# Patient Record
Sex: Male | Born: 1960 | Race: Black or African American | Hispanic: No | Smoking: Never smoker
Health system: Southern US, Community
[De-identification: ages and names within clinical notes are randomized; demographics above are authoritative.]

## PROBLEM LIST (undated history)

## (undated) ENCOUNTER — Telehealth

## (undated) ENCOUNTER — Encounter: Attending: Primary Care | Primary: Primary Care

## (undated) ENCOUNTER — Ambulatory Visit

## (undated) ENCOUNTER — Ambulatory Visit: Payer: MEDICARE

## (undated) ENCOUNTER — Encounter

## (undated) ENCOUNTER — Encounter: Attending: Family | Primary: Family

## (undated) ENCOUNTER — Ambulatory Visit: Payer: MEDICARE | Attending: Primary Care | Primary: Primary Care

## (undated) DIAGNOSIS — G4730 Sleep apnea, unspecified: Secondary | ICD-10-CM

## (undated) DIAGNOSIS — E669 Obesity, unspecified: Secondary | ICD-10-CM

## (undated) DIAGNOSIS — Z8739 Personal history of other diseases of the musculoskeletal system and connective tissue: Secondary | ICD-10-CM

## (undated) DIAGNOSIS — G43909 Migraine, unspecified, not intractable, without status migrainosus: Secondary | ICD-10-CM

## (undated) DIAGNOSIS — R7303 Prediabetes: Secondary | ICD-10-CM

## (undated) DIAGNOSIS — I428 Other cardiomyopathies: Secondary | ICD-10-CM

## (undated) DIAGNOSIS — I5042 Chronic combined systolic (congestive) and diastolic (congestive) heart failure: Secondary | ICD-10-CM

## (undated) DIAGNOSIS — M1990 Unspecified osteoarthritis, unspecified site: Secondary | ICD-10-CM

## (undated) HISTORY — DX: Chronic combined systolic (congestive) and diastolic (congestive) heart failure: I50.42

## (undated) HISTORY — DX: Obesity, unspecified: E66.9

## (undated) HISTORY — PX: KNEE ARTHROSCOPY: SHX127

## (undated) HISTORY — PX: EYE SURGERY: SHX253

## (undated) HISTORY — DX: Prediabetes: R73.03

---

## 1999-07-28 ENCOUNTER — Ambulatory Visit (HOSPITAL_BASED_OUTPATIENT_CLINIC_OR_DEPARTMENT_OTHER): Admission: RE | Admit: 1999-07-28 | Discharge: 1999-07-28 | Payer: Self-pay | Admitting: Specialist

## 2016-12-10 ENCOUNTER — Emergency Department (HOSPITAL_COMMUNITY): Payer: Worker's Compensation

## 2016-12-10 ENCOUNTER — Encounter (HOSPITAL_COMMUNITY): Payer: Self-pay | Admitting: Emergency Medicine

## 2016-12-10 ENCOUNTER — Emergency Department (HOSPITAL_COMMUNITY)
Admission: EM | Admit: 2016-12-10 | Discharge: 2016-12-10 | Disposition: A | Discharge status: Home / Self Care | Payer: Worker's Compensation | Attending: Emergency Medicine | Admitting: Emergency Medicine

## 2016-12-10 DIAGNOSIS — M546 Pain in thoracic spine: Secondary | ICD-10-CM | POA: Diagnosis not present

## 2016-12-10 DIAGNOSIS — Y999 Unspecified external cause status: Secondary | ICD-10-CM | POA: Diagnosis not present

## 2016-12-10 DIAGNOSIS — Y92410 Unspecified street and highway as the place of occurrence of the external cause: Secondary | ICD-10-CM | POA: Diagnosis not present

## 2016-12-10 DIAGNOSIS — Y939 Activity, unspecified: Secondary | ICD-10-CM | POA: Diagnosis not present

## 2016-12-10 HISTORY — DX: Unspecified osteoarthritis, unspecified site: M19.90

## 2016-12-10 MED ORDER — METHOCARBAMOL 500 MG PO TABS: 500.0000 mg | ORAL_TABLET | Freq: Two times a day (BID) | ORAL | 0 refills | Status: DC

## 2016-12-10 MED ORDER — METHOCARBAMOL 500 MG PO TABS
500.0000 mg | ORAL_TABLET | Freq: Once | ORAL | Status: AC
  Administered 2016-12-10: 500 mg via ORAL
  Filled 2016-12-10: qty 1

## 2016-12-10 MED ORDER — NAPROXEN 500 MG PO TABS
500.0000 mg | ORAL_TABLET | Freq: Once | ORAL | Status: AC
  Administered 2016-12-10: 500 mg via ORAL
  Filled 2016-12-10: qty 1

## 2016-12-10 MED ORDER — NAPROXEN 500 MG PO TABS: 500.0000 mg | ORAL_TABLET | Freq: Two times a day (BID) | ORAL | 0 refills | Status: DC
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
## 2016-12-10 NOTE — ED Notes (Signed)
Family at bedside. 
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
## 2017-04-07 HISTORY — PX: CARDIAC CATHETERIZATION: SHX172

## 2017-04-19 ENCOUNTER — Encounter (HOSPITAL_COMMUNITY): Payer: Self-pay

## 2017-04-19 ENCOUNTER — Emergency Department (HOSPITAL_COMMUNITY): Payer: PRIVATE HEALTH INSURANCE

## 2017-04-19 ENCOUNTER — Inpatient Hospital Stay (HOSPITAL_COMMUNITY)
Admission: EM | Admit: 2017-04-19 | Discharge: 2017-04-23 | DRG: 287 | Disposition: A | Discharge status: Home / Self Care | Payer: PRIVATE HEALTH INSURANCE | Attending: Cardiology | Admitting: Cardiology

## 2017-04-19 DIAGNOSIS — I10 Essential (primary) hypertension: Secondary | ICD-10-CM

## 2017-04-19 DIAGNOSIS — I493 Ventricular premature depolarization: Secondary | ICD-10-CM | POA: Diagnosis not present

## 2017-04-19 DIAGNOSIS — M1710 Unilateral primary osteoarthritis, unspecified knee: Secondary | ICD-10-CM | POA: Diagnosis present

## 2017-04-19 DIAGNOSIS — I491 Atrial premature depolarization: Secondary | ICD-10-CM | POA: Diagnosis not present

## 2017-04-19 DIAGNOSIS — I5042 Chronic combined systolic (congestive) and diastolic (congestive) heart failure: Secondary | ICD-10-CM | POA: Diagnosis present

## 2017-04-19 DIAGNOSIS — I428 Other cardiomyopathies: Secondary | ICD-10-CM

## 2017-04-19 DIAGNOSIS — I472 Ventricular tachycardia: Secondary | ICD-10-CM | POA: Diagnosis not present

## 2017-04-19 DIAGNOSIS — I4729 Other ventricular tachycardia: Secondary | ICD-10-CM

## 2017-04-19 DIAGNOSIS — E876 Hypokalemia: Secondary | ICD-10-CM | POA: Diagnosis not present

## 2017-04-19 DIAGNOSIS — I509 Heart failure, unspecified: Secondary | ICD-10-CM

## 2017-04-19 DIAGNOSIS — I110 Hypertensive heart disease with heart failure: Secondary | ICD-10-CM | POA: Diagnosis present

## 2017-04-19 DIAGNOSIS — G4730 Sleep apnea, unspecified: Secondary | ICD-10-CM | POA: Diagnosis present

## 2017-04-19 DIAGNOSIS — I361 Nonrheumatic tricuspid (valve) insufficiency: Secondary | ICD-10-CM | POA: Diagnosis not present

## 2017-04-19 DIAGNOSIS — I5041 Acute combined systolic (congestive) and diastolic (congestive) heart failure: Secondary | ICD-10-CM | POA: Diagnosis not present

## 2017-04-19 DIAGNOSIS — Z9889 Other specified postprocedural states: Secondary | ICD-10-CM

## 2017-04-19 HISTORY — DX: Sleep apnea, unspecified: G47.30

## 2017-04-19 HISTORY — DX: Other cardiomyopathies: I42.8

## 2017-04-19 HISTORY — DX: Chronic combined systolic (congestive) and diastolic (congestive) heart failure: I50.42

## 2017-04-19 LAB — CBC
HCT: 41.7 % (ref 39.0–52.0)
HEMOGLOBIN: 14.1 g/dL (ref 13.0–17.0)
MCH: 30.1 pg (ref 26.0–34.0)
MCHC: 33.8 g/dL (ref 30.0–36.0)
MCV: 88.9 fL (ref 78.0–100.0)
PLATELETS: 182 10*3/uL (ref 150–400)
RBC: 4.69 MIL/uL (ref 4.22–5.81)
RDW: 12.7 % (ref 11.5–15.5)
WBC: 4.7 10*3/uL (ref 4.0–10.5)

## 2017-04-19 LAB — BASIC METABOLIC PANEL
ANION GAP: 7 (ref 5–15)
BUN: 8 mg/dL (ref 6–20)
CALCIUM: 8.8 mg/dL — AB (ref 8.9–10.3)
CO2: 26 mmol/L (ref 22–32)
CREATININE: 0.95 mg/dL (ref 0.61–1.24)
Chloride: 109 mmol/L (ref 101–111)
Glucose, Bld: 125 mg/dL — ABNORMAL HIGH (ref 65–99)
Potassium: 3.6 mmol/L (ref 3.5–5.1)
Sodium: 142 mmol/L (ref 135–145)

## 2017-04-19 LAB — I-STAT TROPONIN, ED: TROPONIN I, POC: 0.04 ng/mL (ref 0.00–0.08)

## 2017-04-19 MED ORDER — FUROSEMIDE 10 MG/ML IJ SOLN
40.0000 mg | Freq: Once | INTRAMUSCULAR | Status: AC
  Administered 2017-04-19: 40 mg via INTRAVENOUS
  Filled 2017-04-19: qty 4

## 2017-04-19 MED ORDER — POTASSIUM CHLORIDE CRYS ER 20 MEQ PO TBCR
40.0000 meq | EXTENDED_RELEASE_TABLET | Freq: Once | ORAL | Status: AC
  Administered 2017-04-19: 40 meq via ORAL
  Filled 2017-04-19: qty 2

## 2017-04-19 NOTE — ED Provider Notes (Signed)
[facility]-EMERGENCY DEPT Provider Note   CSN: [id number] Arrival date & time: 04/19/17  1556     History   Chief Complaint Chief Complaint  Patient presents with  . Shortness of Breath    HPI [name] is a 56 y.o. male.  HPI Pt has been having trouble with shortness of breath over the last 3-4 weeks.  He has had some coughing, brining up dark brown mucus.  His dyspnea gets worse when he exerts himself.  He also notices it also more when lying flat.   He is also having nocturnal dyspnea.  He does have a lot of snoring at night.  Pt went to [facility] last night.  He had blood tests and blood work. It sounds like he also had a pulmonary test.  He was sent to have an echocardiogram today.  He was told that he could have something wrong with his heart and they sent him to the ED to see a cardiologist.  Pt has the echo results with him.  Pt has not been able to work because of his dyspnea issues.  Past Medical History:  Diagnosis Date  . Arthritis     There are no active problems to display for this patient.   Past Surgical History:  Procedure Laterality Date  . KNEE SURGERY         Home Medications    Prior to Admission medications   Medication Sig Start Date End Date Taking? Authorizing Provider  Multiple Vitamins-Minerals (MULTIVITAMIN ADULTS 50+ PO) Take 2 tablets by mouth daily.   Yes [provider]  naproxen sodium (ANAPROX) 220 MG tablet Take 880 mg by mouth every 6 (six) hours as needed (pain).   Yes [provider]  OVER THE COUNTER MEDICATION Take 15 mLs by mouth daily. [employer]; for cough   Yes [provider]  methocarbamol (ROBAXIN) 500 MG tablet Take 1 tablet (500 mg total) by mouth 2 (two) times daily. Patient not taking: Reported on 04/19/2017 12/10/16   Garlon Hatchet, PA-C  naproxen (NAPROSYN) 500 MG tablet Take 1 tablet (500 mg total) by mouth 2 (two) times daily with a meal. Patient not taking: Reported on  04/19/2017 12/10/16   Garlon Hatchet, PA-C    Family History No family history on file.  Social History Social History  Substance Use Topics  . Smoking status: Never Smoker  . Smokeless tobacco: Never Used  . Alcohol use No     Allergies   Patient has no known allergies.   Review of Systems Review of Systems  All other systems reviewed and are negative.    Physical Exam Updated Vital Signs BP (!) 128/94   Pulse 77   Temp 98.1 F (36.7 C) (Oral)   Resp (!) 21   SpO2 96%   Physical Exam  Constitutional: He appears well-developed and well-nourished. No distress.  HENT:  Head: Normocephalic and atraumatic.  Right Ear: External ear normal.  Left Ear: External ear normal.  Eyes: Conjunctivae are normal. Right eye exhibits no discharge. Left eye exhibits no discharge. No scleral icterus.  Neck: Neck supple. No tracheal deviation present.  Cardiovascular: Normal rate, regular rhythm and intact distal pulses.   Pulmonary/Chest: Effort normal and breath sounds normal. No stridor. No respiratory distress. He has no wheezes. He has no rales.  Abdominal: Soft. Bowel sounds are normal. He exhibits no distension. There is no tenderness. There is no rebound and no guarding.  Musculoskeletal: He exhibits edema (  trace in ankles). He exhibits no tenderness.  Neurological: He is alert. He has normal strength. No cranial nerve deficit (no facial droop, extraocular movements intact, no slurred speech) or sensory deficit. He exhibits normal muscle tone. He displays no seizure activity. Coordination normal.  Skin: Skin is warm and dry. No rash noted.  Psychiatric: He has a normal mood and affect.  Nursing note and vitals reviewed.    ED Treatments / Results  Labs (all labs ordered are listed, but only abnormal results are displayed) Labs Reviewed  BASIC METABOLIC PANEL - Abnormal; Notable for the following:       Result Value   Glucose, Bld 125 (*)    Calcium 8.8 (*)    All other  components within normal limits  CBC  BRAIN NATRIURETIC PEPTIDE  I-STAT TROPOININ, ED    EKG  EKG Interpretation  Date/Time:  Friday April 19 2017 16:02:38 EDT Ventricular Rate:  79 PR Interval:  162 QRS Duration: 86 QT Interval:  398 QTC Calculation: 456 R Axis:   146 Text Interpretation:  Normal sinus rhythm Possible Left atrial enlargement Right axis deviation Anterior infarct , age undetermined Abnormal ECG No old tracing to compare Confirmed by Linwood Dibbles [phone]) on 04/19/2017 10:11:31 PM       Radiology Dg Chest 2 View  Result Date: 04/19/2017 CLINICAL DATA:  Shortness of breath. EXAM: CHEST  2 VIEW FINDINGS: There is enlargement of the cardiac silhouette. The pulmonary vascularity is normal and the lungs are clear. No effusions. No bone abnormality. IMPRESSION: Enlargement of the cardiac silhouette. This could represent true cardiomegaly or a pericardial effusion. Electronically Signed   By: Francene Boyers M.D.   On: 04/19/2017 16:35    Procedures Procedures (including critical care time)  Medications Ordered in ED Medications - No data to display   Initial Impression / Assessment and Plan / ED Course  I have reviewed the triage vital signs and the nursing notes.  Pertinent labs & imaging results that were available during my care of the patient were reviewed by me and considered in my medical decision making (see chart for details).  Clinical Course as of Apr 19 2210  Fri Apr 19, 2017  2132 Reviewed echo performed as an outpatient.  EF is between 30-35%.  He has inferior akinesis  [JK]    Clinical Course User Index [JK] Linwood Dibbles, MD    Patient's symptoms are consistent with congestive heart failure. His echocardiogram does show a decreased ejection fraction.  Patient's symptoms have been progressively getting worse. He was sent here to see a cardiologist. I discussed case with cardiology. It's reasonable to have the patient admitted and diuresed and proceed with  further evaluation, ie stress testing, cardiac cath.  Final Clinical Impressions(s) / ED Diagnoses   Final diagnoses:  Acute congestive heart failure, unspecified heart failure type [facility])    New Prescriptions New Prescriptions   No medications on file     Linwood Dibbles, MD 04/19/17 2211
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
## 2017-04-19 NOTE — ED Notes (Signed)
cardiology at bedside

## 2017-04-19 NOTE — ED Notes (Addendum)
Pt updated via iconnect.  Pt texted back and requested refreshments.  NS spoke with pt about what he would like and gave pt water and graham crackers.

## 2017-04-19 NOTE — ED Triage Notes (Signed)
Pt arrives with c/o SOB that has been ongoing for months now. He went to [facility] today and was then referred here because "they said the back side of my heart is not functioning properly." Pt denies CP.RR unlabored.

## 2017-04-19 NOTE — ED Notes (Signed)
Pt updated via iConnect.  

## 2017-04-20 DIAGNOSIS — I10 Essential (primary) hypertension: Secondary | ICD-10-CM | POA: Diagnosis present

## 2017-04-20 DIAGNOSIS — I491 Atrial premature depolarization: Secondary | ICD-10-CM | POA: Diagnosis present

## 2017-04-20 DIAGNOSIS — I509 Heart failure, unspecified: Secondary | ICD-10-CM | POA: Insufficient documentation

## 2017-04-20 DIAGNOSIS — I5041 Acute combined systolic (congestive) and diastolic (congestive) heart failure: Secondary | ICD-10-CM

## 2017-04-20 DIAGNOSIS — I472 Ventricular tachycardia: Secondary | ICD-10-CM

## 2017-04-20 DIAGNOSIS — I4729 Other ventricular tachycardia: Secondary | ICD-10-CM

## 2017-04-20 LAB — LIPID PANEL
Cholesterol: 159 mg/dL (ref 0–200)
HDL: 41 mg/dL (ref >40)
LDL CALC: 100 mg/dL — AB (ref 0–99)
Total CHOL/HDL Ratio: 3.9 RATIO
Triglycerides: 91 mg/dL (ref <150)
VLDL: 18 mg/dL (ref 0–40)

## 2017-04-20 LAB — URINALYSIS, ROUTINE W REFLEX MICROSCOPIC
BILIRUBIN URINE: NEGATIVE
GLUCOSE, UA: NEGATIVE mg/dL
HGB URINE DIPSTICK: NEGATIVE
KETONES UR: NEGATIVE mg/dL
LEUKOCYTES UA: NEGATIVE
Nitrite: NEGATIVE
PH: 7 (ref 5.0–8.0)
PROTEIN: NEGATIVE mg/dL
Specific Gravity, Urine: 1.005 (ref 1.005–1.030)

## 2017-04-20 LAB — BASIC METABOLIC PANEL
Anion gap: 9 (ref 5–15)
BUN: 11 mg/dL (ref 6–20)
CALCIUM: 8.8 mg/dL — AB (ref 8.9–10.3)
CO2: 26 mmol/L (ref 22–32)
CREATININE: 0.87 mg/dL (ref 0.61–1.24)
Chloride: 105 mmol/L (ref 101–111)
GFR calc Af Amer: >60 mL/min (ref >60)
GFR calc non Af Amer: >60 mL/min (ref >60)
Glucose, Bld: 97 mg/dL (ref 65–99)
POTASSIUM: 3.5 mmol/L (ref 3.5–5.1)
SODIUM: 140 mmol/L (ref 135–145)

## 2017-04-20 LAB — SEDIMENTATION RATE: SED RATE: 1 mm/h (ref 0–16)

## 2017-04-20 LAB — HIV ANTIBODY (ROUTINE TESTING W REFLEX): HIV Screen 4th Generation wRfx: NONREACTIVE

## 2017-04-20 LAB — MAGNESIUM: Magnesium: 2.2 mg/dL (ref 1.7–2.4)

## 2017-04-20 LAB — C-REACTIVE PROTEIN: CRP: <0.8 mg/dL (ref <1.0)

## 2017-04-20 LAB — TSH: TSH: 1.974 u[IU]/mL (ref 0.350–4.500)

## 2017-04-20 LAB — BRAIN NATRIURETIC PEPTIDE: B Natriuretic Peptide: 654.7 pg/mL — ABNORMAL HIGH (ref 0.0–100.0)

## 2017-04-20 MED ORDER — LISINOPRIL 5 MG PO TABS
5.0000 mg | ORAL_TABLET | Freq: Every day | ORAL | Status: DC
  Administered 2017-04-20 – 2017-04-23 (×4): 5 mg via ORAL
  Filled 2017-04-20 (×4): qty 1

## 2017-04-20 MED ORDER — FUROSEMIDE 10 MG/ML IJ SOLN: 40.0000 mg | Freq: Two times a day (BID) | INTRAMUSCULAR | Status: DC

## 2017-04-20 MED ORDER — ATORVASTATIN CALCIUM 80 MG PO TABS
80.0000 mg | ORAL_TABLET | Freq: Every day | ORAL | Status: DC
  Administered 2017-04-20 – 2017-04-21 (×2): 80 mg via ORAL
  Filled 2017-04-20 (×2): qty 1

## 2017-04-20 MED ORDER — ENOXAPARIN SODIUM 40 MG/0.4ML ~~LOC~~ SOLN
40.0000 mg | SUBCUTANEOUS | Status: DC
  Administered 2017-04-20 – 2017-04-21 (×2): 40 mg via SUBCUTANEOUS
  Filled 2017-04-20 (×2): qty 0.4

## 2017-04-20 MED ORDER — METOPROLOL TARTRATE 12.5 MG HALF TABLET
12.5000 mg | ORAL_TABLET | Freq: Two times a day (BID) | ORAL | Status: DC
  Administered 2017-04-20 – 2017-04-23 (×7): 12.5 mg via ORAL
  Filled 2017-04-20 (×7): qty 1

## 2017-04-20 MED ORDER — ACETAMINOPHEN 325 MG PO TABS: 650.0000 mg | ORAL_TABLET | ORAL | Status: DC | PRN

## 2017-04-20 MED ORDER — ASPIRIN EC 81 MG PO TBEC
81.0000 mg | DELAYED_RELEASE_TABLET | Freq: Every day | ORAL | Status: DC
  Administered 2017-04-20 – 2017-04-21 (×2): 81 mg via ORAL
  Filled 2017-04-20 (×2): qty 1

## 2017-04-20 MED ORDER — FUROSEMIDE 10 MG/ML IJ SOLN
40.0000 mg | Freq: Two times a day (BID) | INTRAMUSCULAR | Status: DC
  Administered 2017-04-20 – 2017-04-21 (×2): 40 mg via INTRAVENOUS
  Filled 2017-04-20 (×2): qty 4

## 2017-04-20 MED ORDER — POTASSIUM CHLORIDE CRYS ER 20 MEQ PO TBCR
40.0000 meq | EXTENDED_RELEASE_TABLET | Freq: Once | ORAL | Status: AC
  Administered 2017-04-20: 40 meq via ORAL
  Filled 2017-04-20: qty 2

## 2017-04-20 MED ORDER — SODIUM CHLORIDE 0.9 % IV SOLN: 250.0000 mL | INTRAVENOUS | Status: DC | PRN

## 2017-04-20 MED ORDER — ONDANSETRON HCL 4 MG/2ML IJ SOLN: 4.0000 mg | Freq: Four times a day (QID) | INTRAMUSCULAR | Status: DC | PRN

## 2017-04-20 MED ORDER — SODIUM CHLORIDE 0.9% FLUSH
3.0000 mL | Freq: Two times a day (BID) | INTRAVENOUS | Status: DC
  Administered 2017-04-20 – 2017-04-21 (×5): 3 mL via INTRAVENOUS

## 2017-04-20 MED ORDER — SODIUM CHLORIDE 0.9% FLUSH: 3.0000 mL | INTRAVENOUS | Status: DC | PRN

## 2017-04-20 NOTE — Progress Notes (Signed)
New IV order placed per patient request As he said that it is itching, will continue to monitor

## 2017-04-20 NOTE — Progress Notes (Addendum)
Progress Note  Patient Name: [name] Date of Encounter: 04/20/2017  Primary Cardiologist: Dr. Hanley Hays [facility]).  Patient prefers to f/u in [place] with Dr. Duke Salvia  Subjective   Shortness of breath still present but improved.  Denies chest pain.  Edema improving.   Inpatient Medications    Scheduled Meds: . aspirin EC  81 mg Oral Daily  . atorvastatin  80 mg Oral q1800  . enoxaparin (LOVENOX) injection  40 mg Subcutaneous Q24H  . furosemide  40 mg Intravenous BID  . furosemide  40 mg Intravenous BID  . lisinopril  5 mg Oral Daily  . sodium chloride flush  3 mL Intravenous Q12H   Continuous Infusions: . sodium chloride     PRN Meds: sodium chloride, acetaminophen, ondansetron (ZOFRAN) IV, sodium chloride flush   Vital Signs    Vitals:   04/20/17 0019 04/20/17 0103 04/20/17 0514 04/20/17 1157  BP:  127/88 130/87 121/90  Pulse: 76 76 78 81  Resp: 18 18 18 20   Temp:  97.8 F (36.6 C) 98.2 F (36.8 C) 98.6 F (37 C)  TempSrc:  Oral Oral Oral  SpO2: 96% 96% 94% 96%  Weight:  116.5 kg (256 lb 14.4 oz) 116.5 kg (256 lb 14.4 oz)   Height:  5\' 10"  (1.778 m) 5\' 11"  (1.803 m)     Intake/Output Summary (Last 24 hours) at 04/20/17 1455 Last data filed at 04/20/17 1443  Gross per 24 hour  Intake              840 ml  Output             2200 ml  Net            -1360 ml   Filed Weights   04/20/17 0103 04/20/17 0514  Weight: 116.5 kg (256 lb 14.4 oz) 116.5 kg (256 lb 14.4 oz)    Telemetry    Sinus rhythm.  18 beats of NSVT - Personally Reviewed  ECG    04/19/17: Sinus rhythm.  Rate 79 bpm.  R axis deviation.  Poor R wave progression.  - Personally Reviewed  Physical Exam   VS:  BP 121/90 (BP Location: Left Arm)   Pulse 81   Temp 98.6 F (37 C) (Oral)   Resp 20   Ht 5\' 11"  (1.803 m)   Wt 116.5 kg (256 lb 14.4 oz)   SpO2 96%   BMI 35.83 kg/m  , BMI Body mass index is 35.83 kg/m. GENERAL:  Well appearing HEENT:  Pupils equal round and  reactive, fundi not visualized, oral mucosa unremarkable NECK: JVP 1cm above clavicle sitting upright, waveform within normal limits, carotid upstroke brisk and symmetric, no bruits, no thyromegaly LUNGS:  Bibasilar crackles. HEART:  RRR.  PMI not displaced or sustained,S1 and S2 within normal limits, no S3, no S4, no clicks, no rubs, no murmurs ABD:  Flat, positive bowel sounds normal in frequency in pitch, no bruits, no rebound, no guarding, no midline pulsatile mass, no hepatomegaly, no splenomegaly EXT:  2 plus pulses throughout, no edema, no cyanosis no clubbing SKIN:  No rashes no nodules NEURO:  Cranial nerves II through XII grossly intact, motor grossly intact throughout PSYCH:  Cognitively intact, oriented to person place and time   Labs    Chemistry Recent Labs Lab 04/19/17 1615 04/20/17 0224  NA 142 140  K 3.6 3.5  CL 109 105  CO2 26 26  GLUCOSE 125* 97  BUN 8 11  CREATININE 0.95 0.87  CALCIUM 8.8* 8.8*  GFRNONAA >60 >60  GFRAA >60 >60  ANIONGAP 7 9     Hematology Recent Labs Lab 04/19/17 1615  WBC 4.7  RBC 4.69  HGB 14.1  HCT 41.7  MCV 88.9  MCH 30.1  MCHC 33.8  RDW 12.7  PLT 182    Cardiac EnzymesNo results for input(s): TROPONINI in the last 168 hours.  Recent Labs Lab 04/19/17 1620  TROPIPOC 0.04     BNP Recent Labs Lab 04/19/17 1615  BNP 654.7*     DDimer No results for input(s): DDIMER in the last 168 hours.   Radiology    Dg Chest 2 View  Result Date: 04/19/2017 CLINICAL DATA:  Shortness of breath. EXAM: CHEST  2 VIEW FINDINGS: There is enlargement of the cardiac silhouette. The pulmonary vascularity is normal and the lungs are clear. No effusions. No bone abnormality. IMPRESSION: Enlargement of the cardiac silhouette. This could represent true cardiomegaly or a pericardial effusion. Electronically Signed   By: Francene Boyers M.D.   On: 04/19/2017 16:35    Cardiac Studies   OSH echo: LVEF 30-35% by report.  Inferior akinesis.    Patient Profile     [name] with no known past medical history here with new onset acute systolic and diastolic heart failure.  Assessment & Plan    # Acute systolic and diastolic heart failure:  # NSVT:  LVEF reportedly 30-35% on recent echo with inferior akinesis.  He remains mildly volume overloaded after one dose of IV lasix last night.  We will start lasix 40mg  IV bid.  Plan for LHC likely Monday if he can lay flat.  Lisinopril was started this admission.  BP is soft.  We will add metoprolol 12.5mg  bid.  He had an 18 beat run of asymptomatic NSVT this AM.  Patient admits that he drinks a gallon of water daily.  He has reduced the sodium in his diet, but his intake is still high.  He will need additional nutrition resources.  He reports URI symptoms 2-3 weeks ago.  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  Consider life vest if he has more NSVT on beta blocker.   Signed, Chilton Si, MD  04/20/2017, 2:Robert PM

## 2017-04-20 NOTE — Progress Notes (Signed)
New Admission Note:   Arrival Method: ED Tech, ambulated to unit bed Mental Orientation: A&O x4 Telemetry: initiated Pain: denies Safety Measures: initiated, wife at bedside   Orders to be reviewed and implemented. Will continue to monitor the patient. Call light has been placed within reach and bed alarm has been activated.   Mar Daring, RN

## 2017-04-20 NOTE — ED Notes (Signed)
Pt. Urinated .

## 2017-04-21 ENCOUNTER — Inpatient Hospital Stay (HOSPITAL_COMMUNITY): Payer: PRIVATE HEALTH INSURANCE

## 2017-04-21 DIAGNOSIS — I361 Nonrheumatic tricuspid (valve) insufficiency: Secondary | ICD-10-CM

## 2017-04-21 LAB — BASIC METABOLIC PANEL
Anion gap: 8 (ref 5–15)
BUN: 16 mg/dL (ref 6–20)
CO2: 27 mmol/L (ref 22–32)
Calcium: 8.8 mg/dL — ABNORMAL LOW (ref 8.9–10.3)
Chloride: 103 mmol/L (ref 101–111)
Creatinine, Ser: 1.02 mg/dL (ref 0.61–1.24)
GFR calc Af Amer: >60 mL/min (ref >60)
GLUCOSE: 95 mg/dL (ref 65–99)
POTASSIUM: 3.6 mmol/L (ref 3.5–5.1)
Sodium: 138 mmol/L (ref 135–145)

## 2017-04-21 LAB — ECHOCARDIOGRAM COMPLETE
HEIGHTINCHES: 71 in
WEIGHTICAEL: 4035.2 [oz_av]

## 2017-04-21 MED ORDER — FUROSEMIDE 40 MG PO TABS
40.0000 mg | ORAL_TABLET | Freq: Every day | ORAL | Status: DC
  Administered 2017-04-22: 40 mg via ORAL
  Filled 2017-04-21: qty 1

## 2017-04-21 MED ORDER — SODIUM CHLORIDE 0.9% FLUSH: 3.0000 mL | INTRAVENOUS | Status: DC | PRN

## 2017-04-21 MED ORDER — SODIUM CHLORIDE 0.9 % IV SOLN: 250.0000 mL | INTRAVENOUS | Status: DC | PRN

## 2017-04-21 MED ORDER — SODIUM CHLORIDE 0.9% FLUSH
3.0000 mL | Freq: Two times a day (BID) | INTRAVENOUS | Status: DC
  Administered 2017-04-22: 3 mL via INTRAVENOUS

## 2017-04-21 MED ORDER — SODIUM CHLORIDE 0.9 % IV SOLN
INTRAVENOUS | Status: DC
  Administered 2017-04-22: 06:00:00 via INTRAVENOUS

## 2017-04-21 MED ORDER — ASPIRIN 81 MG PO CHEW
81.0000 mg | CHEWABLE_TABLET | ORAL | Status: AC
  Administered 2017-04-22: 81 mg via ORAL
  Filled 2017-04-21: qty 1

## 2017-04-21 MED ORDER — FUROSEMIDE 10 MG/ML IJ SOLN: 40.0000 mg | Freq: Every day | INTRAMUSCULAR | Status: DC

## 2017-04-21 NOTE — Progress Notes (Signed)
  Echocardiogram 2D Echocardiogram has been performed.  Kamarrion Stfort 04/21/2017, 2:36 PM

## 2017-04-21 NOTE — Progress Notes (Signed)
Progress Note  Patient Name: [name] Date of Encounter: 04/21/2017  Primary Cardiologist: Dr. Hanley Hays [facility]).  Patient prefers to f/u in [place] with Dr. Duke Salvia  Subjective   Shortness of breath better.  His daughter noted that his breathing was irregular overnight. He had some mild shortness of breath upon awakening but this has improved. Denies chest pain. Edema has improved.  Inpatient Medications    Scheduled Meds: . aspirin EC  81 mg Oral Daily  . atorvastatin  80 mg Oral q1800  . enoxaparin (LOVENOX) injection  40 mg Subcutaneous Q24H  . [START ON 04/22/2017] furosemide  40 mg Intravenous Daily  . lisinopril  5 mg Oral Daily  . metoprolol tartrate  12.5 mg Oral BID  . sodium chloride flush  3 mL Intravenous Q12H   Continuous Infusions: . sodium chloride     PRN Meds: sodium chloride, acetaminophen, ondansetron (ZOFRAN) IV, sodium chloride flush   Vital Signs    Vitals:   04/21/17 0221 04/21/17 0348 04/21/17 0725 04/21/17 1151  BP: 122/79  117/88 114/81  Pulse: 68  65 61  Resp: 18  18 18   Temp: 98.3 F (36.8 C)  97.7 F (36.5 C) 98.4 F (36.9 C)  TempSrc: Oral  Oral Oral  SpO2: 98%  98% 96%  Weight:  114.4 kg (252 lb 3.2 oz)    Height:        Intake/Output Summary (Last 24 hours) at 04/21/17 1254 Last data filed at 04/21/17 1156  Gross per 24 hour  Intake              960 ml  Output             5150 ml  Net            -4190 ml   Filed Weights   04/20/17 0103 04/20/17 0514 04/21/17 0348  Weight: 116.5 kg (256 lb 14.4 oz) 116.5 kg (256 lb 14.4 oz) 114.4 kg (252 lb 3.2 oz)    Telemetry    Sinus rhythm.  5 beats of NSVT - Personally Reviewed  ECG    04/19/17: Sinus rhythm.  Rate 79 bpm.  R axis deviation.  Poor R wave progression.  - Personally Reviewed  Physical Exam   VS:  BP 114/81 (BP Location: Right Arm)   Pulse 61   Temp 98.4 F (36.9 C) (Oral)   Resp 18   Ht 5\' 11"  (1.803 m)   Wt 114.4 kg (252 lb 3.2 oz)    SpO2 96%   BMI 35.17 kg/m  , BMI Body mass index is 35.17 kg/m. GENERAL:  Well appearing.  No acute distress. HEENT:  Pupils equal round and reactive, fundi not visualized, oral mucosa unremarkable NECK: No JVD.  Carotid upstroke brisk and symmetric, no bruits, no thyromegaly LUNGS:  Clear to auscultation bilaterally. No crackles, wheezes, or rhonchi. HEART:  RRR.  PMI not displaced or sustained,S1 and S2 within normal limits, no S3, no S4, no clicks, no rubs, no murmurs ABD:  Flat, positive bowel sounds normal in frequency in pitch, no bruits, no rebound, no guarding, no midline pulsatile mass, no hepatomegaly, no splenomegaly EXT:  2 plus pulses throughout, no edema, no cyanosis no clubbing SKIN:  No rashes no nodules NEURO:  Cranial nerves II through XII grossly intact, motor grossly intact throughout [facility]:  Cognitively intact, oriented to person place and time   Labs    Chemistry  Recent Labs Lab 04/19/17 1615 04/20/17 0224 04/21/17  0333  NA 142 140 138  K 3.6 3.5 3.6  CL 109 105 103  CO2 26 26 27   GLUCOSE 125* 97 95  BUN 8 11 16   CREATININE 0.95 0.87 1.02  CALCIUM 8.8* 8.8* 8.8*  GFRNONAA >60 >60 >60  GFRAA >60 >60 >60  ANIONGAP 7 9 8      Hematology  Recent Labs Lab 04/19/17 1615  WBC 4.7  RBC 4.69  HGB 14.1  HCT 41.7  MCV 88.9  MCH 30.1  MCHC 33.8  RDW 12.7  PLT 182    Cardiac EnzymesNo results for input(s): TROPONINI in the last 168 hours.   Recent Labs Lab 04/19/17 1620  TROPIPOC 0.04     BNP  Recent Labs Lab 04/19/17 1615  BNP 654.7*     DDimer No results for input(s): DDIMER in the last 168 hours.   Radiology    Dg Chest 2 View  Result Date: 04/19/2017 CLINICAL DATA:  Shortness of breath. EXAM: CHEST  2 VIEW FINDINGS: There is enlargement of the cardiac silhouette. The pulmonary vascularity is normal and the lungs are clear. No effusions. No bone abnormality. IMPRESSION: Enlargement of the cardiac silhouette. This could represent  true cardiomegaly or a pericardial effusion. Electronically Signed   By: Francene Boyers M.D.   On: 04/19/2017 16:35    Cardiac Studies   OSH echo: LVEF 30-35% by report.  Inferior akinesis.   Patient Profile     56 y.o. male with no known past medical history here with new onset acute systolic and diastolic heart failure.  Assessment & Plan    # Acute systolic and diastolic heart failure:  # NSVT:  LVEF reportedly 30-35% on recent echo with inferior akinesis.   Status is much better. He was net -3 L yesterday. Renal function remains normal but starting to increase. We will stop IV Lasix and start Lasix 40 mg by mouth daily tomorrow. He had another short run of NSVT. On 7/14 it was 18 beats. Today it was 5 beats. It seems as he is improving with metoprolol but will definitely need a left heart catheterization. He had some mild orthopnea overnight but thinks he can lay flat for cath.  LHC tomorrow.  Patient admits that he drinks a gallon of water daily.  He has reduced the sodium in his diet, but his intake is still high.  He will need additional nutrition resources.  He reports URI symptoms 2-3 weeks ago.  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  Consider life vest if he has more NSVT on beta blocker.   Signed, Chilton Si, MD  04/21/2017, 12:54 PM

## 2017-04-21 NOTE — Progress Notes (Signed)
Pt has a 5 beats of V-tach, asymptomatic, Vitals stable, MD aware

## 2017-04-21 NOTE — Progress Notes (Signed)
Pt stable during 7am to 7pm shift, vitals stable, consent done for cardiac cath tomorrow, pt is aware about the procedure, will continue to monitor

## 2017-04-22 ENCOUNTER — Encounter (HOSPITAL_COMMUNITY)
Admission: EM | Disposition: A | Discharge status: Home / Self Care | Payer: Self-pay | Source: Home / Self Care | Attending: Cardiology

## 2017-04-22 HISTORY — PX: LEFT HEART CATH AND CORONARY ANGIOGRAPHY: CATH118249

## 2017-04-22 LAB — BASIC METABOLIC PANEL
ANION GAP: 9 (ref 5–15)
BUN: 12 mg/dL (ref 6–20)
CALCIUM: 8.8 mg/dL — AB (ref 8.9–10.3)
CO2: 27 mmol/L (ref 22–32)
CREATININE: 1.09 mg/dL (ref 0.61–1.24)
Chloride: 102 mmol/L (ref 101–111)
Glucose, Bld: 98 mg/dL (ref 65–99)
Potassium: 3.6 mmol/L (ref 3.5–5.1)
SODIUM: 138 mmol/L (ref 135–145)

## 2017-04-22 LAB — PROTIME-INR
INR: 0.97
Prothrombin Time: 12.9 seconds (ref 11.4–15.2)

## 2017-04-22 LAB — CBC
HEMATOCRIT: 44.3 % (ref 39.0–52.0)
HEMOGLOBIN: 14.6 g/dL (ref 13.0–17.0)
MCH: 29.1 pg (ref 26.0–34.0)
MCHC: 33 g/dL (ref 30.0–36.0)
MCV: 88.4 fL (ref 78.0–100.0)
Platelets: 195 10*3/uL (ref 150–400)
RBC: 5.01 MIL/uL (ref 4.22–5.81)
RDW: 12.9 % (ref 11.5–15.5)
WBC: 4 10*3/uL (ref 4.0–10.5)

## 2017-04-22 LAB — HEPATITIS PANEL, ACUTE
HCV Ab: <0.1 s/co ratio (ref 0.0–0.9)
Hep A IgM: NEGATIVE
Hep B C IgM: NEGATIVE
Hepatitis B Surface Ag: NEGATIVE

## 2017-04-22 LAB — CREATININE, SERUM
Creatinine, Ser: 1.01 mg/dL (ref 0.61–1.24)
GFR calc non Af Amer: >60 mL/min (ref >60)

## 2017-04-22 SURGERY — LEFT HEART CATH AND CORONARY ANGIOGRAPHY
Anesthesia: LOCAL

## 2017-04-22 MED ORDER — ACETAMINOPHEN 325 MG PO TABS
650.0000 mg | ORAL_TABLET | ORAL | Status: DC | PRN
  Administered 2017-04-22: 650 mg via ORAL
  Filled 2017-04-22: qty 2

## 2017-04-22 MED ORDER — MIDAZOLAM HCL 2 MG/2ML IJ SOLN
INTRAMUSCULAR | Status: DC | PRN
  Administered 2017-04-22: 1 mg via INTRAVENOUS

## 2017-04-22 MED ORDER — FENTANYL CITRATE (PF) 100 MCG/2ML IJ SOLN
INTRAMUSCULAR | Status: DC | PRN
  Administered 2017-04-22: 50 ug via INTRAVENOUS

## 2017-04-22 MED ORDER — FUROSEMIDE 10 MG/ML IJ SOLN
40.0000 mg | Freq: Two times a day (BID) | INTRAMUSCULAR | Status: DC
  Administered 2017-04-22 – 2017-04-23 (×2): 40 mg via INTRAVENOUS
  Filled 2017-04-22 (×2): qty 4

## 2017-04-22 MED ORDER — LIDOCAINE HCL 1 % IJ SOLN
INTRAMUSCULAR | Status: AC
  Filled 2017-04-22: qty 20

## 2017-04-22 MED ORDER — HEPARIN (PORCINE) IN NACL 2-0.9 UNIT/ML-% IJ SOLN
INTRAMUSCULAR | Status: AC | PRN
  Administered 2017-04-22: 1500 mL

## 2017-04-22 MED ORDER — HEPARIN SODIUM (PORCINE) 1000 UNIT/ML IJ SOLN
INTRAMUSCULAR | Status: DC | PRN
  Administered 2017-04-22: 5500 [IU] via INTRAVENOUS

## 2017-04-22 MED ORDER — HEPARIN SODIUM (PORCINE) 5000 UNIT/ML IJ SOLN
5000.0000 [IU] | Freq: Three times a day (TID) | INTRAMUSCULAR | Status: DC
  Administered 2017-04-23: 5000 [IU] via SUBCUTANEOUS
  Filled 2017-04-22: qty 1

## 2017-04-22 MED ORDER — VERAPAMIL HCL 2.5 MG/ML IV SOLN
INTRAVENOUS | Status: DC | PRN
  Administered 2017-04-22: 10 mL via INTRA_ARTERIAL

## 2017-04-22 MED ORDER — FENTANYL CITRATE (PF) 100 MCG/2ML IJ SOLN
INTRAMUSCULAR | Status: AC
  Filled 2017-04-22: qty 2

## 2017-04-22 MED ORDER — ONDANSETRON HCL 4 MG/2ML IJ SOLN: 4.0000 mg | Freq: Four times a day (QID) | INTRAMUSCULAR | Status: DC | PRN

## 2017-04-22 MED ORDER — ASPIRIN 81 MG PO CHEW
81.0000 mg | CHEWABLE_TABLET | Freq: Every day | ORAL | Status: DC
  Administered 2017-04-23: 81 mg via ORAL
  Filled 2017-04-22: qty 1

## 2017-04-22 MED ORDER — IOPAMIDOL (ISOVUE-370) INJECTION 76%
INTRAVENOUS | Status: AC
  Filled 2017-04-22: qty 100

## 2017-04-22 MED ORDER — HEPARIN SODIUM (PORCINE) 1000 UNIT/ML IJ SOLN
INTRAMUSCULAR | Status: AC
  Filled 2017-04-22: qty 1

## 2017-04-22 MED ORDER — SODIUM CHLORIDE 0.9% FLUSH: 3.0000 mL | INTRAVENOUS | Status: DC | PRN

## 2017-04-22 MED ORDER — IOPAMIDOL (ISOVUE-370) INJECTION 76%
INTRAVENOUS | Status: DC | PRN
  Administered 2017-04-22: 45 mL via INTRA_ARTERIAL

## 2017-04-22 MED ORDER — SODIUM CHLORIDE 0.9 % IV SOLN: 250.0000 mL | INTRAVENOUS | Status: DC | PRN

## 2017-04-22 MED ORDER — LIDOCAINE HCL (PF) 1 % IJ SOLN
INTRAMUSCULAR | Status: DC | PRN
  Administered 2017-04-22: 2 mL

## 2017-04-22 MED ORDER — MIDAZOLAM HCL 2 MG/2ML IJ SOLN
INTRAMUSCULAR | Status: AC
  Filled 2017-04-22: qty 2

## 2017-04-22 MED ORDER — SODIUM CHLORIDE 0.9% FLUSH
3.0000 mL | Freq: Two times a day (BID) | INTRAVENOUS | Status: DC
  Administered 2017-04-23: 3 mL via INTRAVENOUS

## 2017-04-22 MED ORDER — HEPARIN (PORCINE) IN NACL 2-0.9 UNIT/ML-% IJ SOLN
INTRAMUSCULAR | Status: AC
Start: 2017-04-22 — End: 2017-04-22
  Filled 2017-04-22: qty 1500

## 2017-04-22 MED ORDER — OXYCODONE-ACETAMINOPHEN 5-325 MG PO TABS: 1.0000 | ORAL_TABLET | ORAL | Status: DC | PRN

## 2017-04-22 SURGICAL SUPPLY — 12 items
CATH INFINITI 5 FR JL3.5 (CATHETERS) ×1 IMPLANT
CATH INFINITI JR4 5F (CATHETERS) ×1 IMPLANT
COVER PRB 48X5XTLSCP FOLD TPE (BAG) IMPLANT
COVER PROBE 5X48 (BAG) ×2
DEVICE RAD COMP TR BAND LRG (VASCULAR PRODUCTS) ×1 IMPLANT
GLIDESHEATH SLEND SS 6F .021 (SHEATH) ×1 IMPLANT
GUIDEWIRE INQWIRE 1.5J.035X260 (WIRE) IMPLANT
INQWIRE 1.5J .035X260CM (WIRE) ×2
KIT HEART LEFT (KITS) ×2 IMPLANT
PACK CARDIAC CATHETERIZATION (CUSTOM PROCEDURE TRAY) ×2 IMPLANT
TRANSDUCER W/STOPCOCK (MISCELLANEOUS) ×2 IMPLANT
TUBING CIL FLEX 10 FLL-RA (TUBING) ×2 IMPLANT

## 2017-04-22 NOTE — H&P (View-Only) (Signed)
Progress Note  Patient Name: [name] Date of Encounter: 04/22/2017  Primary Cardiologist: Dr. Hanley Hays -- [facility]  Subjective   No chest pain and can lie flat for cath.  Answered all questions   Inpatient Medications    Scheduled Meds: . aspirin EC  81 mg Oral Daily  . atorvastatin  80 mg Oral q1800  . enoxaparin (LOVENOX) injection  40 mg Subcutaneous Q24H  . furosemide  40 mg Oral Daily  . lisinopril  5 mg Oral Daily  . metoprolol tartrate  12.5 mg Oral BID  . sodium chloride flush  3 mL Intravenous Q12H  . sodium chloride flush  3 mL Intravenous Q12H   Continuous Infusions: . sodium chloride    . sodium chloride    . sodium chloride 10 mL/hr at 04/22/17 0622   PRN Meds: sodium chloride, sodium chloride, acetaminophen, ondansetron (ZOFRAN) IV, sodium chloride flush, sodium chloride flush   Vital Signs    Vitals:   04/21/17 0830 04/21/17 1151 04/21/17 2034 04/22/17 0612  BP: 110/80 114/81 112/73 114/73  Pulse:  61 71 69  Resp: 18 18 18 18   Temp:  98.4 F (36.9 C) 97.6 F (36.4 C) 98.1 F (36.7 C)  TempSrc:  Oral Oral Oral  SpO2: 98% 96% 96% 96%  Weight:    250 lb 12.8 oz (113.8 kg)  Height:        Intake/Output Summary (Last 24 hours) at 04/22/17 0843 Last data filed at 04/22/17 0612  Gross per 24 hour  Intake              560 ml  Output             3350 ml  Net            -2790 ml   Filed Weights   04/20/17 0514 04/21/17 0348 04/22/17 0612  Weight: 256 lb 14.4 oz (116.5 kg) 252 lb 3.2 oz (114.4 kg) 250 lb 12.8 oz (113.8 kg)    Telemetry    SR occ PVCs, no further NSVT - Personally Reviewed  ECG    No new - Personally Reviewed  Physical Exam   GEN: No acute distress.   Neck: No JVD Cardiac: RRR, no murmurs, rubs, or gallops.  Respiratory: Clear to auscultation bilaterally. GI: Soft, nontender, non-distended  MS: No edema; No deformity. Neuro:  Nonfocal  Psych: Normal affect   Labs    Chemistry Recent Labs Lab  04/20/17 0224 04/21/17 0333 04/22/17 0405  NA 140 138 138  K 3.5 3.6 3.6  CL 105 103 102  CO2 26 27 27   GLUCOSE 97 95 98  BUN 11 16 12   CREATININE 0.87 1.02 1.09  CALCIUM 8.8* 8.8* 8.8*  GFRNONAA >60 >60 >60  GFRAA >60 >60 >60  ANIONGAP 9 8 9      Hematology Recent Labs Lab 04/19/17 1615  WBC 4.7  RBC 4.69  HGB 14.1  HCT 41.7  MCV 88.9  MCH 30.1  MCHC 33.8  RDW 12.7  PLT 182    Cardiac EnzymesNo results for input(s): TROPONINI in the last 168 hours.  Recent Labs Lab 04/19/17 1620  TROPIPOC 0.04     BNP Recent Labs Lab 04/19/17 1615  BNP 654.7*     DDimer No results for input(s): DDIMER in the last 168 hours.   Radiology    No results found.  Cardiac Studies   OSH echo: LVEF 30-35% by report.  Inferior akinesis.    Patient Profile  56 y.o. male with no known past medical history here with new onset acute systolic and diastolic heart failure.  Assessment & Plan    Acute systolic and diastolic heart failure EF 30-35% with inf. akinesis  Was on IV lasix and to begin po today.   --did note URI 2-3 weeks ago.   --neg 3,800 in last 24 hours    -- wt from 256 to 250 lbs.  --TSH 1.974 --Cr. 1.09 --K+ 3.6 --for cath today- will let him have clear liquids this AM  Cardiomyopathy for cath today   NSVT-  7/14 he has 18 beats and 04/22/17 he had 5 beats.  Is on metoprolol.   Consider life vest if he has more NSVT on beta blocker.  --Troponin 0.04  --occ PVCs over last 24 hours no NSVT   Signed, Nada Boozer, NP  04/22/2017, 8:43 AM    History and all data above reviewed.  Patient examined.  I agree with the findings as above.  He denies any chest pain.  He is much better with his breathing but still not quite at baseline.  He is able to lie flat.   The patient exam reveals COR:RRR  ,  Lungs: Clear  ,  Abd: Positive bowel sounds, no rebound no guarding, Ext no edema  .  All available labs, radiology testing, previous records reviewed. Agree with  documented assessment and plan. Acute systolic HF:  On oral Lasix, ACE inhibitor and beta blocker.  Left heart for etiology.   NSVT:  Noted as above.  Will consider further therapy based on the results of the cath.  Unlikely to need Life Kyla Balzarine  9:38 AM  04/22/2017

## 2017-04-22 NOTE — Care Management Note (Signed)
Case Management Note  Patient Details  Name: KEYANDRE MOYO MRN: [id number] Date of Birth: [birth year]  Subjective/Objective:    CHF               Action/Plan: Patient is independent of his ADL's, lives at home with spouse; works at [employer] ; has Geneticist, molecular with prescription drug coverage; CM following for DCP.  Expected Discharge Date:   possibly 04/24/2017               Expected Discharge Plan:  Home/Self Care  Discharge planning Services  CM Consult  Status of Service:  In process, will continue to follow  Reola Mosher [phone] 04/22/2017, 11:19 AM

## 2017-04-22 NOTE — Progress Notes (Signed)
   Per patient request, Advanced Directive documentation (AD) left at bedside.  If/when patient decides to move forward w/ AD, please page on-call chaplain or contact the spiritual care department.  Will follow, as needed.

## 2017-04-22 NOTE — Progress Notes (Signed)
 Progress Note  Patient Name: [name] Date of Encounter: 04/22/2017  Primary Cardiologist: Dr. Rosario -- [facility]  Subjective   No chest pain and can lie flat for cath.  Answered all questions   Inpatient Medications    Scheduled Meds: . aspirin EC  81 mg Oral Daily  . atorvastatin  80 mg Oral q1800  . enoxaparin (LOVENOX) injection  40 mg Subcutaneous Q24H  . furosemide  40 mg Oral Daily  . lisinopril  5 mg Oral Daily  . metoprolol tartrate  12.5 mg Oral BID  . sodium chloride flush  3 mL Intravenous Q12H  . sodium chloride flush  3 mL Intravenous Q12H   Continuous Infusions: . sodium chloride    . sodium chloride    . sodium chloride 10 mL/hr at 04/22/17 0622   PRN Meds: sodium chloride, sodium chloride, acetaminophen, ondansetron (ZOFRAN) IV, sodium chloride flush, sodium chloride flush   Vital Signs    Vitals:   04/21/17 0830 04/21/17 1151 04/21/17 2034 04/22/17 0612  BP: 110/80 114/81 112/73 114/73  Pulse:  61 71 69  Resp: 18 18 18 18  Temp:  98.4 F (36.9 C) 97.6 F (36.4 C) 98.1 F (36.7 C)  TempSrc:  Oral Oral Oral  SpO2: 98% 96% 96% 96%  Weight:    250 lb 12.8 oz (113.8 kg)  Height:        Intake/Output Summary (Last 24 hours) at 04/22/17 0843 Last data filed at 04/22/17 0612  Gross per 24 hour  Intake              560 ml  Output             3350 ml  Net            -2790 ml   Filed Weights   04/20/17 0514 04/21/17 0348 04/22/17 0612  Weight: 256 lb 14.4 oz (116.5 kg) 252 lb 3.2 oz (114.4 kg) 250 lb 12.8 oz (113.8 kg)    Telemetry    SR occ PVCs, no further NSVT - Personally Reviewed  ECG    No new - Personally Reviewed  Physical Exam   GEN: No acute distress.   Neck: No JVD Cardiac: RRR, no murmurs, rubs, or gallops.  Respiratory: Clear to auscultation bilaterally. GI: Soft, nontender, non-distended  MS: No edema; No deformity. Neuro:  Nonfocal  Psych: Normal affect   Labs    Chemistry Recent Labs Lab  04/20/17 0224 04/21/17 0333 04/22/17 0405  NA 140 138 138  K 3.5 3.6 3.6  CL 105 103 102  CO2 26 27 27  GLUCOSE 97 95 98  BUN 11 16 12  CREATININE 0.87 1.02 1.09  CALCIUM 8.8* 8.8* 8.8*  GFRNONAA >60 >60 >60  GFRAA >60 >60 >60  ANIONGAP 9 8 9     Hematology Recent Labs Lab 04/19/17 1615  WBC 4.7  RBC 4.69  HGB 14.1  HCT 41.7  MCV 88.9  MCH 30.1  MCHC 33.8  RDW 12.7  PLT 182    Cardiac EnzymesNo results for input(s): TROPONINI in the last 168 hours.  Recent Labs Lab 04/19/17 1620  TROPIPOC 0.04     BNP Recent Labs Lab 04/19/17 1615  BNP 654.7*     DDimer No results for input(s): DDIMER in the last 168 hours.   Radiology    No results found.  Cardiac Studies   OSH echo: LVEF 30-35% by report.  Inferior akinesis.    Patient Profile       56 y.o. male with no known past medical history here with new onset acute systolic and diastolic heart failure.  Assessment & Plan    Acute systolic and diastolic heart failure EF 30-35% with inf. akinesis  Was on IV lasix and to begin po today.   --did note URI 2-3 weeks ago.   --neg 3,800 in last 24 hours    -- wt from 256 to 250 lbs.  --TSH 1.974 --Cr. 1.09 --K+ 3.6 --for cath today- will let him have clear liquids this AM  Cardiomyopathy for cath today   NSVT-  7/14 he has 18 beats and 04/22/17 he had 5 beats.  Is on metoprolol.   Consider life vest if he has more NSVT on beta blocker.  --Troponin 0.04  --occ PVCs over last 24 hours no NSVT   Signed, Laura Ingold, NP  04/22/2017, 8:43 AM    History and all data above reviewed.  Patient examined.  I agree with the findings as above.  He denies any chest pain.  He is much better with his breathing but still not quite at baseline.  He is able to lie flat.   The patient exam reveals COR:RRR  ,  Lungs: Clear  ,  Abd: Positive bowel sounds, no rebound no guarding, Ext no edema  .  All available labs, radiology testing, previous records reviewed. Agree with  documented assessment and plan. Acute systolic HF:  On oral Lasix, ACE inhibitor and beta blocker.  Left heart for etiology.   NSVT:  Noted as above.  Will consider further therapy based on the results of the cath.  Unlikely to need Life Vest.     [name]  9:38 AM  04/22/2017 

## 2017-04-22 NOTE — Interval H&P Note (Signed)
Cath Lab Visit (complete for each Cath Lab visit)  Clinical Evaluation Leading to the Procedure:   ACS: No.  Non-ACS:    Anginal Classification: CCS III  Anti-ischemic medical therapy: Minimal Therapy (1 class of medications)  Non-Invasive Test Results: No non-invasive testing performed  Prior CABG: No previous CABG      History and Physical Interval Note:  04/22/2017 6:40 PM  [name]  has presented today for surgery, with the diagnosis of cardiomyopathy  The various methods of treatment have been discussed with the patient and family. After consideration of risks, benefits and other options for treatment, the patient has consented to  Procedure(s): Left Heart Cath and Coronary Angiography ([name]) as a surgical intervention .  The patient's history has been reviewed, patient examined, no change in status, stable for surgery.  I have reviewed the patient's chart and labs.  Questions were answered to the patient's satisfaction.     Lyn Records III

## 2017-04-22 NOTE — Progress Notes (Signed)
Pt was concerned about having a stent placed if need be, wanted to talk with the cardiologist before having this placed, thanks Glenna Fellows.

## 2017-04-23 ENCOUNTER — Telehealth: Payer: Self-pay | Admitting: Physician Assistant

## 2017-04-23 ENCOUNTER — Encounter (HOSPITAL_COMMUNITY): Payer: Self-pay | Admitting: Interventional Cardiology

## 2017-04-23 DIAGNOSIS — Z9889 Other specified postprocedural states: Secondary | ICD-10-CM

## 2017-04-23 DIAGNOSIS — I428 Other cardiomyopathies: Secondary | ICD-10-CM

## 2017-04-23 DIAGNOSIS — G4730 Sleep apnea, unspecified: Secondary | ICD-10-CM | POA: Diagnosis present

## 2017-04-23 HISTORY — DX: Other cardiomyopathies: I42.8

## 2017-04-23 HISTORY — DX: Sleep apnea, unspecified: G47.30

## 2017-04-23 LAB — BASIC METABOLIC PANEL
Anion gap: 9 (ref 5–15)
BUN: 11 mg/dL (ref 6–20)
CHLORIDE: 104 mmol/L (ref 101–111)
CO2: 22 mmol/L (ref 22–32)
Calcium: 8.7 mg/dL — ABNORMAL LOW (ref 8.9–10.3)
Creatinine, Ser: 1.03 mg/dL (ref 0.61–1.24)
GFR calc Af Amer: >60 mL/min (ref >60)
GFR calc non Af Amer: >60 mL/min (ref >60)
GLUCOSE: 116 mg/dL — AB (ref 65–99)
POTASSIUM: 3.4 mmol/L — AB (ref 3.5–5.1)
Sodium: 135 mmol/L (ref 135–145)

## 2017-04-23 LAB — HEMOGLOBIN A1C
HEMOGLOBIN A1C: 5.8 % — AB (ref 4.8–5.6)
MEAN PLASMA GLUCOSE: 120 mg/dL

## 2017-04-23 MED ORDER — POTASSIUM CHLORIDE CRYS ER 20 MEQ PO TBCR
40.0000 meq | EXTENDED_RELEASE_TABLET | Freq: Once | ORAL | Status: AC
  Administered 2017-04-23: 40 meq via ORAL
  Filled 2017-04-23: qty 2

## 2017-04-23 MED ORDER — LISINOPRIL 5 MG PO TABS: 5.0000 mg | ORAL_TABLET | Freq: Every day | ORAL | 6 refills | Status: DC

## 2017-04-23 MED ORDER — METOPROLOL TARTRATE 25 MG PO TABS: 12.5000 mg | ORAL_TABLET | Freq: Two times a day (BID) | ORAL | 6 refills | Status: DC

## 2017-04-23 MED ORDER — ACETAMINOPHEN 325 MG PO TABS: 650.0000 mg | ORAL_TABLET | ORAL | Status: DC | PRN

## 2017-04-23 MED ORDER — POTASSIUM CHLORIDE ER 10 MEQ PO TBCR: 20.0000 meq | EXTENDED_RELEASE_TABLET | Freq: Every day | ORAL | 6 refills | Status: DC

## 2017-04-23 MED ORDER — ASPIRIN 81 MG PO CHEW
81.0000 mg | CHEWABLE_TABLET | Freq: Every day | ORAL | Status: DC
Start: 2017-04-24 — End: 2023-09-02

## 2017-04-23 MED ORDER — ATORVASTATIN CALCIUM 40 MG PO TABS: 40.0000 mg | ORAL_TABLET | Freq: Every day | ORAL | 6 refills | Status: DC

## 2017-04-23 MED ORDER — ATORVASTATIN CALCIUM 40 MG PO TABS: 40.0000 mg | ORAL_TABLET | Freq: Every day | ORAL | Status: DC

## 2017-04-23 MED ORDER — FUROSEMIDE 40 MG PO TABS: 40.0000 mg | ORAL_TABLET | Freq: Two times a day (BID) | ORAL | 6 refills | Status: DC

## 2017-04-23 NOTE — Discharge Summary (Signed)
Physician Discharge Summary       Patient ID: [name] MRN: [id number] DOB/AGE: [id number] y.o.  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  Normal left anterior descending.  Normal circumflex.  Normal right coronary artery.  Severely elevated left ventricular end-diastolic pressure, with LVEDP of 33 mmHg. Hemodynamics are compatible with acute on chronic combined systolic and diastolic heart failure. EF by echo 30%.  RECOMMENDATIONS:   With elevated LVEDP, will give further doses of IV Lasix. The procedure was performed with the patient lying on a wedge because of dyspnea.  Guideline based therapy for HFrEF.  Indications   Acute combined systolic and diastolic HF (heart failure) (HCC) [I50.41 (ICD-10-CM)]   Show images for Cardiac catheterization   Link to Procedure Log   Procedure Log    Hemo Data    Most Recent Value  AO Systolic Pressure 123 mmHg  AO Diastolic Pressure 88 mmHg  AO Mean 103 mmHg  LV Systolic Pressure 120 mmHg  LV Diastolic Pressure 18 mmHg  LV EDP 33 mmHg  Arterial Occlusion Pressure Extended Systolic Pressure 117 mmHg  Arterial Occlusion Pressure Extended Diastolic Pressure 83 mmHg  Arterial Occlusion Pressure Extended Mean Pressure 99 mmHg  Left Ventricular Apex Extended Systolic Pressure 118 mmHg  Left Ventricular Apex Extended Diastolic Pressure 16 mmHg  Left Ventricular Apex  Extended EDP Pressure 28 mmHg    ECHO  04/21/17 Study Conclusions  - Left ventricle: The cavity size was mildly dilated. Wall   thickness was increased in a pattern of mild LVH. Left   ventricular geometry showed evidence of eccentric hypertrophy.   Systolic function was severely reduced. The estimated ejection   fraction was in the range of 20% to 25%. Severe diffuse   hypokinesis with no identifiable regional variations. Doppler   parameters are consistent with restrictive physiology, indicative   of decreased left ventricular diastolic compliance and/or   increased left atrial pressure. - Left atrium: The atrium was moderately to severely dilated. - Right ventricle: The cavity size was mildly dilated. - Right atrium: The atrium was mildly dilated. - Atrial septum: No defect or patent foramen ovale was identified. - Pulmonary arteries: Systolic pressure was mildly increased. PA   peak pressure: 35 mm Hg (S).  Hospital Course:  48 yoM presented 04/19/17 with new dx. Of combined systolic and diastolic HF.   No prior cardiac issues.  He works for [employer] heavy [employer].  2-3 weeks prior to admit he felt he has a cold and had brown sputum with productive cough.  No fever.  Since then he became more SOB, DOE, and LE edema.  Also PND.  Drinks a gallon of water daily.  One episode of chest pressure.   Echo was done from Urgent care where initially seen and  It showed mildly dilated LV with EF 30-35%. Inferior akinesis. Moderately dilated LA. Normal Rv size and function. Normal RA size. AV normal structurally, no stenosis or regurg. Mitral valve with normal function. RVSP by TR jet  is 48 mmHg.  He was then sent to [facility] for treatment and further evaluation.  Hospital stay:   Pt was diuresed and is negative 9,656 since admit.  Wt is down 6 lbs at 250 lbs at discharge.    Pt has NSVT 14 beats then 5 beats BB was added and no further episodes. He is on ACE as well.    Once he was stable for  cath he was found to have patent coronary arteries and LVEDP of 33 mmHg.  Extra lasix was given.    Dr. Antoine Poche saw day of discharge and another dose of IV lasix given. By the afternoon he felt better and was able to ambulate without SOB.  Pt will d/c on Lasix po, ACE, statin and BB.  His  K+ was replaced.  He will need 5-7 day TOC appointment.   Will titrate meds as tolerated BB and ACE with thought to entresto.  Out of work for now may need 2 weeks out.   Plan for repeat echo in about 3 months.    Of note, he needs to have consideration of an outpatient of a sleep study.     NEEDS BMP ON OUTPATIENT VISIT and arrange Sleep study   Consults: None  Significant Diagnostic Studies:  BMP Latest Ref Rng & Units 04/23/2017 04/22/2017 04/22/2017  Glucose 65 - 99 mg/dL 161(W) - 98  BUN 6 - 20 mg/dL 11 - 12  Creatinine 9.60 - 1.24 mg/dL 4.54 0.98 1.19  Sodium 135 - 145 mmol/L 135 - 138  Potassium 3.5 - 5.1 mmol/L 3.4(L) - 3.6  Chloride 101 - 111 mmol/L 104 - 102  CO2 22 - 32 mmol/L 22 - 27  Calcium 8.9 - 10.3 mg/dL 1.4(N) - 8.8(L)     CBC Latest Ref Rng & Units 04/22/2017 04/19/2017  WBC 4.0 - 10.5 K/uL 4.0 4.7  Hemoglobin 13.0 - 17.0 g/dL 82.9 56.2  Hematocrit 13.0 - 52.0 % 44.3 41.7  Platelets 150 - 400 K/uL 195 182   Lipid Panel     Component Value Date/Time   CHOL 159 04/20/2017 0224   TRIG 91 04/20/2017 0224   HDL 41 04/20/2017 0224   CHOLHDL 3.9 04/20/2017 0224   VLDL 18 04/20/2017 0224   LDLCALC 100 (H) 04/20/2017 0224   Hgb A1C 5.8 TSH 1.974 Hepatitis A AB IgM negative Hep B and Hep C  And HIV all neg.  U/a clear  CHEST  2 VIEW  FINDINGS: There is enlargement of the cardiac silhouette. The pulmonary vascularity is normal and the lungs are clear. No effusions. No bone abnormality.  IMPRESSION: Enlargement of the cardiac silhouette. This could represent true cardiomegaly or a pericardial effusion.   Discharge Exam: Blood pressure 108/71, pulse 67, temperature  98.1 F (36.7 C), temperature source Oral, resp. rate 20, height 5\' 11"  (1.803 m), weight 250 lb 8 oz (113.6 kg), SpO2 95 %.  Disposition: 01-Home or Self Care   Allergies as of 04/23/2017   No Known Allergies     Medication List    STOP taking these medications   naproxen 500 MG tablet Commonly known as:  NAPROSYN     TAKE these medications   acetaminophen 325 MG tablet Commonly known as:  TYLENOL Take 2 tablets (650 mg total) by mouth every 4 (four) hours as needed for headache or mild pain.   aspirin 81 MG chewable tablet Chew 1 tablet (81 mg total) by mouth daily.   atorvastatin 40 MG tablet Commonly  known as:  LIPITOR Take 1 tablet (40 mg total) by mouth daily at 6 PM.   furosemide 40 MG tablet Commonly known as:  LASIX Take 1 tablet (40 mg total) by mouth 2 (two) times daily.   lisinopril 5 MG tablet Commonly known as:  PRINIVIL,ZESTRIL Take 1 tablet (5 mg total) by mouth daily.   methocarbamol 500 MG tablet Commonly known as:  ROBAXIN Take 1 tablet (500 mg total) by mouth 2 (two) times daily.   metoprolol tartrate 25 MG tablet Commonly known as:  LOPRESSOR Take 0.5 tablets (12.5 mg total) by mouth 2 (two) times daily.   MULTIVITAMIN ADULTS 50+ PO Take 2 tablets by mouth daily.   naproxen sodium 220 MG tablet Commonly known as:  ANAPROX Take 880 mg by mouth every 6 (six) hours as needed (pain).   OVER THE COUNTER MEDICATION Take 15 mLs by mouth daily. [employer]; for cough   potassium chloride 10 MEQ tablet Commonly known as:  K-DUR Take 2 tablets (20 mEq total) by mouth daily.      Follow-up Information    Tereso Newcomer T, PA-C Follow up on 04/29/2017.   Specialties:  Cardiology, Physician Assistant Why:  at 11:45 AM this is Dr. Leonides Sake PA   Contact information: [address] [place] [address] [phone]            Discharge Instructions:  Call [facility] at [phone] if any  bleeding, swelling or drainage at cath site.  May shower, no tub baths for 48 hours for groin sticks. No lifting over 5 pounds for 3 days.  No Driving for 3 days.  No work until seen in office.    Weigh daily and call the office [phone] for weight increase of 3 lbs in a day and 5 lbs in a week, or increased SOB.    Heart healthy 2,000 mg salt diet.  Very little salt.   2000 cc fluid restriction per day   Your Cardiologist is Dr. Chilton Si, but for first visit you will see her PA.      Signed: Nada Boozer Nurse Practitioner-Certified [facility]: Ulyses Southward 04/23/2017, 3:21 PM  Time spent on discharge : > 30 minutes.    Patient seen and examined.  Plan as discussed in my rounding note for today and outlined above. Rollene Rotunda  04/23/2017  3:26 PM

## 2017-04-23 NOTE — Discharge Instructions (Signed)
Call [facility] at [phone] if any bleeding, swelling or drainage at cath site.  May shower, no tub baths for 48 hours for groin sticks. No lifting over 5 pounds for 3 days.  No Driving for 3 days.  No work until seen in office.    Weigh daily and call the office [phone] for weight increase of 3 lbs in a day and 5 lbs in a week, or increased SOB.    Heart healthy 2,000 mg salt diet.  Very little salt.   2000 cc fluid restriction per day   Your Cardiologist is Dr. Chilton Si, but for first visit you will see her PA.
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
## 2017-04-23 NOTE — Telephone Encounter (Signed)
New message       TCM appt on 04-29-17 with Tereso Newcomer @11 :45 per Nada Boozer.  This is a Dr Duke Salvia pt but no 5-7 day [facility].  Not sure who to send TCM msg to so I am sending it to both locations.  Vernona Rieger said pt must be seen in 5-7 days

## 2017-04-23 NOTE — Telephone Encounter (Signed)
Patient discharged from hospital 04/23/17 - first TCM outreach should be 04/24/17

## 2017-04-23 NOTE — Progress Notes (Signed)
Discharge instructions reviewed with patient and patient's spouse, patient to follow up with MD next week appointment is already arranged, iv removed and tolerated well, questions and concerns answered Stanford Breed RN 5:24 PM 04-23-2017

## 2017-04-23 NOTE — Progress Notes (Signed)
Progress Note  Patient Name: [name] Date of Encounter: 04/23/2017  Primary Cardiologist:   Dr. Hanley Hays   Subjective   He is breathing OK.  He has not ambulated.  No chest pain.  No PND.  Inpatient Medications    Scheduled Meds: . aspirin  81 mg Oral Daily  . atorvastatin  80 mg Oral q1800  . furosemide  40 mg Intravenous Q12H  . heparin  5,000 Units Subcutaneous Q8H  . lisinopril  5 mg Oral Daily  . metoprolol tartrate  12.5 mg Oral BID  . sodium chloride flush  3 mL Intravenous Q12H   Continuous Infusions: . sodium chloride     PRN Meds: sodium chloride, acetaminophen, ondansetron (ZOFRAN) IV, oxyCODONE-acetaminophen, sodium chloride flush   Vital Signs    Vitals:   04/22/17 2045 04/22/17 2115 04/23/17 0038 04/23/17 0500  BP: 119/81 (!) 163/66 116/73 118/76  Pulse: 67 67 69 64  Resp:   18 18  Temp:   98 F (36.7 C) 98.1 F (36.7 C)  TempSrc:   Oral Oral  SpO2:   98% 98%  Weight:    250 lb 8 oz (113.6 kg)  Height:        Intake/Output Summary (Last 24 hours) at 04/23/17 1003 Last data filed at 04/23/17 0841  Gross per 24 hour  Intake           758.83 ml  Output             3175 ml  Net         -2416.17 ml   Filed Weights   04/21/17 0348 04/22/17 0612 04/23/17 0500  Weight: 252 lb 3.2 oz (114.4 kg) 250 lb 12.8 oz (113.8 kg) 250 lb 8 oz (113.6 kg)    Telemetry    NSR, rare PVCs - Personally Reviewed  ECG    NA - Personally Reviewed  Physical Exam   GEN: No acute distress.   Neck: No  JVD Cardiac: RRR, no murmurs, rubs, or gallops.  Respiratory: Clear  to auscultation bilaterally. GI: Soft, nontender, non-distended  MS: No edema; No deformity.  Right wrist without bruising or bleeding.  Neuro:  Nonfocal  Psych: Normal affect   Labs    Chemistry Recent Labs Lab 04/21/17 0333 04/22/17 0405 04/22/17 2031 04/23/17 0123  NA 138 138  --  135  K 3.6 3.6  --  3.4*  CL 103 102  --  104  CO2 27 27  --  22  GLUCOSE 95 98  --   116*  BUN 16 12  --  11  CREATININE 1.02 1.09 1.01 1.03  CALCIUM 8.8* 8.8*  --  8.7*  GFRNONAA >60 >60 >60 >60  GFRAA >60 >60 >60 >60  ANIONGAP 8 9  --  9     Hematology Recent Labs Lab 04/19/17 1615 04/22/17 2031  WBC 4.7 4.0  RBC 4.69 5.01  HGB 14.1 14.6  HCT 41.7 44.3  MCV 88.9 88.4  MCH 30.1 29.1  MCHC 33.8 33.0  RDW 12.7 12.9  PLT 182 195    Cardiac EnzymesNo results for input(s): TROPONINI in the last 168 hours.  Recent Labs Lab 04/19/17 1620  TROPIPOC 0.04     BNP Recent Labs Lab 04/19/17 1615  BNP 654.7*     DDimer No results for input(s): DDIMER in the last 168 hours.   Radiology    No results found.  Cardiac Studies   Cath 04/23/17   Normal left  main.  Normal left anterior descending.  Normal circumflex.  Normal right coronary artery.  Severely elevated left ventricular end-diastolic pressure, with LVEDP of 33 mmHg. Hemodynamics are compatible with acute on chronic combined systolic and diastolic heart failure. EF by echo 30%.   Patient Profile     56 y.o. male with no known past medical history here with new onset acute systolic and diastolic heart failure.  Assessment & Plan    ACUTE SYSTOLIC HF:    Normal coronaries as above.  EDP is elevated.  He will get IV Lasix this morning and then ambulate and possibly home later today.  I had a long talk about fluid restriction and salt restriction.  He will remain on the meds as listed although he would likely need Lasix 40 mg bid PO at home.    NSVT:  He has had no further NSVT.   HYPOKALEMIA:  Supplemented.     Signed, Rollene Rotunda, MD  04/23/2017, 10:03 AM

## 2017-04-24 ENCOUNTER — Telehealth: Payer: Self-pay

## 2017-04-24 ENCOUNTER — Other Ambulatory Visit: Payer: Self-pay | Admitting: Cardiology

## 2017-04-24 NOTE — Telephone Encounter (Signed)
-----   Message from Leone Brand, NP sent at 04/23/2017  2:51 PM EDT ----- Pt needs TCO/TOC and is scheduled for 04/29/17 with Tereso Newcomer to be d/'c today the 17th

## 2017-04-24 NOTE — Telephone Encounter (Signed)
Outreach made to Pt for TCM f/u.  Call went to VM.  Left this nurse name and # for call back.  Will cont to monitor.

## 2017-04-24 NOTE — Telephone Encounter (Signed)
TCM 1st attempt on home phone #, no answer

## 2017-04-25 ENCOUNTER — Telehealth: Payer: Self-pay

## 2017-04-25 LAB — URINE DRUGS OF ABUSE SCREEN W ALC, ROUTINE (REF LAB)
AMPHETAMINES, URINE: NEGATIVE ng/mL
BARBITURATE, UR: NEGATIVE ng/mL
Benzodiazepine Quant, Ur: NEGATIVE ng/mL
Cannabinoid Quant, Ur: NEGATIVE ng/mL
Cocaine (Metab.): NEGATIVE ng/mL
Ethanol U, Quan: NEGATIVE %
Methadone Screen, Urine: NEGATIVE ng/mL
OPIATE QUANT UR: NEGATIVE ng/mL
Phencyclidine, Ur: NEGATIVE ng/mL
Propoxyphene, Urine: NEGATIVE ng/mL

## 2017-04-25 NOTE — Telephone Encounter (Signed)
Patient contacted regarding discharge from [facility]  on 04/19/2017 - 04/23/2017 (4 days)  Patient understands to follow up with provider 04-29-17 with Tereso Newcomer @11 :[address] at [address] office(address given to pt). Patient understands discharge instructions? YES Patient understands medications and regiment? YES, pt states that he needs refills but does not have medications available he states that he will call back

## 2017-04-25 NOTE — Telephone Encounter (Signed)
F/u Message  Pt returning call about missing medication. Please call back to discuss

## 2017-04-25 NOTE — Telephone Encounter (Signed)
Pt states that he went over his d/c instructions and does not need any refills at that time. Pt states that he will have to have his paperwork dated from the date of discharge for his employer. Informed pt that this should not be a problem and to discuss at [facility] appt with Tereso Newcomer.

## 2017-04-26 NOTE — Telephone Encounter (Signed)
Pt has been contacted for TCM f/u.  No further action needed.

## 2017-04-29 ENCOUNTER — Encounter: Payer: Self-pay | Admitting: Physician Assistant

## 2017-04-29 ENCOUNTER — Ambulatory Visit (INDEPENDENT_AMBULATORY_CARE_PROVIDER_SITE_OTHER): Payer: PRIVATE HEALTH INSURANCE | Admitting: Physician Assistant

## 2017-04-29 VITALS — BP 110/70 | HR 72 | Ht 70.0 in | Wt 258.4 lb

## 2017-04-29 DIAGNOSIS — I472 Ventricular tachycardia: Secondary | ICD-10-CM | POA: Diagnosis not present

## 2017-04-29 DIAGNOSIS — R0683 Snoring: Secondary | ICD-10-CM | POA: Diagnosis not present

## 2017-04-29 DIAGNOSIS — I5042 Chronic combined systolic (congestive) and diastolic (congestive) heart failure: Secondary | ICD-10-CM | POA: Diagnosis not present

## 2017-04-29 DIAGNOSIS — I4729 Other ventricular tachycardia: Secondary | ICD-10-CM

## 2017-04-29 DIAGNOSIS — I428 Other cardiomyopathies: Secondary | ICD-10-CM

## 2017-04-29 MED ORDER — METOPROLOL TARTRATE 25 MG PO TABS: 25.0000 mg | ORAL_TABLET | Freq: Two times a day (BID) | ORAL | 3 refills | Status: DC

## 2017-04-29 NOTE — Patient Instructions (Addendum)
Medication Instructions:  Increase Metoprolol to 25 mg Twice daily   Your metoprolol is a beta-blocker and it is a medication that slows your heart rate down and help with increasing your heart function. Your Lisinopril is an ACE inhibitor and it helps with managing your blood pressure and with increasing your heart function.   Labwork: 1. Today - BMET  2. FASTING LIPPID AND LIVER PANEL TO BE DONE IN 6 WEEKS 06/10/17 LAB OPENS AT 7:30 AM  Testing/Procedures: Arrange split night sleep study. NINA JONES, CMA WILL CALL YOU TO SET UP SLEEP STUDY  Follow-Up: 05/30/17 @ 8:40 WITH DR, Katrinka Blazing  ANy Other Special Instructions Will Be Listed Below (If Applicable).  If you need a refill on your cardiac medications before your next appointment, please call your pharmacy.

## 2017-04-29 NOTE — Progress Notes (Addendum)
Cardiology Office Note:    Date:  04/29/2017   ID:  [name], DOB [birth year], MRN [id number]  PCP:  [name], PA-C  Cardiologist:  Patient Requests [name]     Referring MD: [name], PA-C   Chief Complaint  Patient presents with  . Hospitalization Follow-up    admitted for CHF    History of Present Illness:    [name] is a 56 y.o. male who was admitted 7/13-7/17 with acute combined systolic and diastolic HF.  He was diuresed with IV diuretics.  He was neg 9+ Liters at DC.  LHC demonstrated normal coronary arteries.  Tele did demonstrate NSVT.  EF was 20-25 on Echo.  LHC demonstrated normal coronary arteries.  He was DC on beta-blocker, ACE inhibitor, furosemide.    [name] returns for follow up.  He is here alone.  He is doing well.  He denies chest pain.  His breathing is improved. He denies LE edema.  He denies syncope.  He is not weighing himself at home.  He needs paperwork filled out for disability at work.  He works at [employer]  and has to lift ~ 40 lbs from time to time.   Prior CV studies:   The following studies were reviewed today:  LHC 04/22/17 Normal coronary arteries; LVEDP 33  Echo 04/21/17 Mild LVH, EF 20-25, diff HK, restrictive physiology, mod to severe LAE, mild RVE, mild RAE, PASP 35  Past Medical History:  Diagnosis Date  . Arthritis   . Chronic combined systolic and diastolic heart failure (HCC) 04/19/2017   LHC 04/22/17 - Normal coronary arteries; LVEDP 33 // Echo 04/21/17 - Mild LVH, EF 20-25, diff HK, restrictive physiology, mod to severe LAE, mild RVE, mild RAE, PASP 35  . NICM (nonischemic cardiomyopathy) (HCC) 04/23/2017  . S/P cardiac cath, patent coronary arteries 04/22/17 04/23/2017  . Sleep apnea 04/23/2017    Past Surgical History:  Procedure Laterality Date  . KNEE SURGERY    . LEFT HEART CATH AND CORONARY ANGIOGRAPHY [name] 04/22/2017   Procedure: Left Heart Cath and Coronary Angiography;  Surgeon: Lyn Records, MD;  Location: [facility]AS[facility] CV LAB;  Service: Cardiovascular;  Laterality: [name];    Current Medications: Current Meds  Medication Sig  . acetaminophen (TYLENOL) 325 MG tablet Take 2 tablets (650 mg total) by mouth every 4 (four) hours as needed for headache or mild pain.  [name] aspirin 81 MG chewable tablet Chew 1 tablet (81 mg total) by mouth daily.  [name] atorvastatin (LIPITOR) 40 MG tablet Take 1 tablet (40 mg total) by mouth daily at 6 PM.  . furosemide (LASIX) 40 MG tablet Take 1 tablet (40 mg total) by mouth 2 (two) times daily.  [name] lisinopril (PRINIVIL,ZESTRIL) 5 MG tablet Take 1 tablet (5 mg total) by mouth daily.  . methocarbamol (ROBAXIN) 500 MG tablet Take 1 tablet (500 mg total) by mouth 2 (two) times daily.  . Multiple Vitamins-Minerals (MULTIVITAMIN ADULTS 50+ PO) Take 2 tablets by mouth daily.  . naproxen sodium (ANAPROX) 220 MG tablet Take 880 mg by mouth every 6 (six) hours as needed (pain).  [name] OVER THE COUNTER MEDICATION Take 15 mLs by mouth daily. [employer]; for cough  . potassium chloride (K-DUR) 10 MEQ tablet Take 2 tablets (20 mEq total) by mouth daily.  . [DISCONTINUED] metoprolol tartrate (LOPRESSOR) 25 MG tablet Take 0.5 tablets (12.5 mg total) by mouth 2 (two) times daily.     Allergies:  Patient has no known allergies.   Social History   Social History  . Marital status: Married    Spouse name: [name]  . Number of children: [name]  . Years of education: [name]   Social History Main Topics  . Smoking status: Never Smoker  . Smokeless tobacco: Never Used  . Alcohol use No  . Drug use: Unknown  . Sexual activity: Not Asked   Other Topics Concern  . None   Social History Narrative  . None     Family Hx: The patient's Family history is unknown by patient.  ROS:   Please see the history of present illness.    ROS All other systems reviewed and are negative.   EKGs/Labs/Other Test Reviewed:    EKG:  EKG is  ordered today.  The ekg ordered today  demonstrates NSR, HR 72, LAD, septal Q waves, QTc 427 ms  Recent Labs: 04/19/2017: B Natriuretic Peptide 654.7; Magnesium 2.2 04/20/2017: TSH 1.974 04/22/2017: Hemoglobin 14.6; Platelets 195 04/23/2017: BUN 11; Creatinine, Ser 1.03; Potassium 3.4; Sodium 135   Recent Lipid Panel Lab Results  Component Value Date/Time   CHOL 159 04/20/2017 02:24 AM   TRIG 91 04/20/2017 02:24 AM   HDL 41 04/20/2017 02:24 AM   CHOLHDL 3.9 04/20/2017 02:24 AM   LDLCALC 100 (H) 04/20/2017 02:24 AM    Physical Exam:    VS:  BP 110/70   Pulse 72   Ht 5\' 10"  (1.778 m)   Wt 258 lb 6.4 oz (117.2 kg)   BMI 37.08 kg/m     Wt Readings from Last 3 Encounters:  04/29/17 258 lb 6.4 oz (117.2 kg)  04/23/17 250 lb 8 oz (113.6 kg)     Physical Exam  Constitutional: He is oriented to person, place, and time. He appears well-developed and well-nourished. No distress.  HENT:  Head: Normocephalic and atraumatic.  Eyes: No scleral icterus.  Neck: Normal range of motion. No JVD present.  Cardiovascular: Normal rate, regular rhythm, S1 normal and S2 normal.   No murmur heard. Pulmonary/Chest: Effort normal and breath sounds normal. He has no wheezes. He has no rhonchi. He has no rales.  Abdominal: Soft. There is no tenderness.  Musculoskeletal: He exhibits no edema or deformity (R wrist is without hematoma).  Neurological: He is alert and oriented to person, place, and time.  Skin: Skin is warm and dry.  Psychiatric: He has a normal mood and affect.    ASSESSMENT:    1. Chronic combined systolic and diastolic heart failure (HCC)   2. NICM (nonischemic cardiomyopathy) (HCC)   3. NSVT (nonsustained ventricular tachycardia) (HCC)   4. Snoring    PLAN:    In order of problems listed above:  1. Chronic combined systolic and diastolic heart failure (HCC) -  Volume appears stable on exam.  I have encouraged him to weigh himself daily.  I am not certain that his BP will tolerate the addition of Entresto at this  time.  Check BMET today. Consider reducing or stopping Lasix - which may allow the addition of Entresto.  Continue Lisinopril.  Increase Metoprolol to 25 mg bid.  He prefers to follow up with [name].  FU was arranged with me in 2 weeks, but the patient preferred to push the appointment out to late August so he can see Dr. Katrinka Blazing instead.  2. NICM (nonischemic cardiomyopathy) (HCC) Plan follow up echocardiogram once he is on max medical Rx.  I gave him a note to  remain out of work until he sees Dr. Katrinka Blazing again. He can leave his FMLA papers with me to complete.   3. NSVT (nonsustained ventricular tachycardia) (HCC) -  Continue beta-blocker.   4. Snoring -  Enlarged atria on Echo concerning for OSA.  -  Plan: Split night study   Dispo:  Return in about 2 weeks (around 05/13/2017) for Close Follow Up w/ Dr. Katrinka Blazing , or Tereso Newcomer, PA-C.   Medication Adjustments/Labs and Tests Ordered: Current medicines are reviewed at length with the patient today.  Concerns regarding medicines are outlined above.  Tests Ordered: Orders Placed This Encounter  Procedures  . Basic Metabolic Panel (BMET)  . Lipid Profile  . Hepatic function panel  . EKG 12-Lead  . Split night study   Medication Changes: Meds ordered this encounter  Medications  . metoprolol tartrate (LOPRESSOR) 25 MG tablet    Sig: Take 1 tablet (25 mg total) by mouth 2 (two) times daily.    Dispense:  180 tablet    Refill:  3    Signed, Tereso Newcomer, PA-C  04/29/2017 1:20 PM    [facility] [address] [place], [place], [place]  [address] Phone: [phone]; Fax: [phone]

## 2017-04-30 ENCOUNTER — Telehealth: Payer: Self-pay | Admitting: *Deleted

## 2017-04-30 LAB — BASIC METABOLIC PANEL
BUN/Creatinine Ratio: 13 (ref 9–20)
BUN: 12 mg/dL (ref 6–24)
CALCIUM: 9.8 mg/dL (ref 8.7–10.2)
CO2: 25 mmol/L (ref 20–29)
Chloride: 100 mmol/L (ref 96–106)
Creatinine, Ser: 0.93 mg/dL (ref 0.76–1.27)
GFR calc Af Amer: 106 mL/min/{1.73_m2} (ref >59)
GFR calc non Af Amer: 92 mL/min/{1.73_m2} (ref >59)
Glucose: 75 mg/dL (ref 65–99)
POTASSIUM: 4.5 mmol/L (ref 3.5–5.2)
SODIUM: 143 mmol/L (ref 134–144)

## 2017-04-30 NOTE — Telephone Encounter (Addendum)
Informed patient of upcoming sleep study and patient understanding was verbalized. Patient understands his sleep study is scheduled for Sunday June 30 2017. Patient understands his sleep study will be done at [facility] sleep lab. Patient understands he will receive a sleep packet in a week or so. Patient understands to call if he does not receive the sleep packet in a timely manner. Patient agrees with treatment and thanked me for call  

## 2017-04-30 NOTE — Telephone Encounter (Signed)
-----   Message from Scott T Weaver, PA-C sent at 04/30/2017  2:01 PM EDT ----- Please call patient: The kidney function (BUN, Creatinine) and potassium are normal. All other parameters are within acceptable limits and no further intervention or testing required. Continue with current treatment plan. Scott Weaver, PA-C    04/30/2017 2:01 PM 

## 2017-04-30 NOTE — Telephone Encounter (Signed)
Tried to call the pt to go over his lab results. No answer.

## 2017-05-01 NOTE — Telephone Encounter (Signed)
Tried to call the pt to go over his lab results. Pt said he will have to call me back.

## 2017-05-01 NOTE — Telephone Encounter (Signed)
-----   Message from Beatrice Lecher, [place] sent at 04/30/2017  2:01 PM EDT ----- Please call patient: The kidney function (BUN, Creatinine) and potassium are normal. All other parameters are within acceptable limits and no further intervention or testing required. Continue with current treatment plan. Tereso Newcomer, PA-C    04/30/2017 2:01 PM

## 2017-05-01 NOTE — Telephone Encounter (Signed)
New message     Pt returned Okey Regal call for lab results

## 2017-05-01 NOTE — Telephone Encounter (Signed)
Pt has been notified of lab results by phone with verbal understanding. Pt is agreeable to plan of care. ------

## 2017-05-02 ENCOUNTER — Telehealth: Payer: Self-pay | Admitting: Physician Assistant

## 2017-05-02 NOTE — Telephone Encounter (Signed)
Called left patient vm on cell number. FMLA signed & Completed ready for pick up.

## 2017-05-14 ENCOUNTER — Ambulatory Visit: Payer: PRIVATE HEALTH INSURANCE | Admitting: Physician Assistant

## 2017-05-27 NOTE — Progress Notes (Signed)
Cardiology Office Note    Date:  05/30/2017   ID:  Juris, Gosnell 1961-09-12, MRN [id number]  PCP:  [name], PA-C  Cardiologist: Lesleigh Noe, MD   Chief Complaint  Patient presents with  . Congestive Heart Failure    History of Present Illness:  [name] is a 56 y.o. male with recently diagnosed non ischemic cardiomyopathy, combined systolic and diastolic heart failure, and obstructive sleep apnea presenting for follow-up.  Idiopathic cardiomyopathy with volume overload during recent hospital stay. No etiology for heart failure was found. Coronary arteries are clean. No prior history of hypertension. Snores and fatigued all the time.  Past Medical History:  Diagnosis Date  . Arthritis   . Chronic combined systolic and diastolic heart failure (HCC) 04/19/2017   LHC 04/22/17 - Normal coronary arteries; LVEDP 33 // Echo 04/21/17 - Mild LVH, EF 20-25, diff HK, restrictive physiology, mod to severe LAE, mild RVE, mild RAE, PASP 35  . NICM (nonischemic cardiomyopathy) (HCC) 04/23/2017  . S/P cardiac cath, patent coronary arteries 04/22/17 04/23/2017  . Sleep apnea 04/23/2017    Past Surgical History:  Procedure Laterality Date  . KNEE SURGERY    . LEFT HEART CATH AND CORONARY ANGIOGRAPHY [name] 04/22/2017   Procedure: Left Heart Cath and Coronary Angiography;  Surgeon: Lyn Records, MD;  Location: [facility] INVASIVE CV LAB;  Service: Cardiovascular;  Laterality: [name];    Current Medications: Outpatient Medications Prior to Visit  Medication Sig Dispense Refill  . acetaminophen (TYLENOL) 325 MG tablet Take 2 tablets (650 mg total) by mouth every 4 (four) hours as needed for headache or mild pain.    [name] aspirin 81 MG chewable tablet Chew 1 tablet (81 mg total) by mouth daily.    [name] atorvastatin (LIPITOR) 40 MG tablet Take 1 tablet (40 mg total) by mouth daily at 6 PM. 30 tablet 6  . furosemide (LASIX) 40 MG tablet Take 1 tablet (40 mg total) by mouth 2 (two) times  daily. 60 tablet 6  . methocarbamol (ROBAXIN) 500 MG tablet Take 1 tablet (500 mg total) by mouth 2 (two) times daily. 20 tablet 0  . metoprolol tartrate (LOPRESSOR) 25 MG tablet Take 1 tablet (25 mg total) by mouth 2 (two) times daily. 180 tablet 3  . Multiple Vitamins-Minerals (MULTIVITAMIN ADULTS 50+ PO) Take 2 tablets by mouth daily.    . naproxen sodium (ANAPROX) 220 MG tablet Take 880 mg by mouth every 6 (six) hours as needed (pain).    [name] OVER THE COUNTER MEDICATION Take 15 mLs by mouth daily. [employer]; for cough    . potassium chloride (K-DUR) 10 MEQ tablet Take 2 tablets (20 mEq total) by mouth daily. 60 tablet 6  . lisinopril (PRINIVIL,ZESTRIL) 5 MG tablet Take 1 tablet (5 mg total) by mouth daily. 30 tablet 6   No facility-administered medications prior to visit.      Allergies:   Patient has no known allergies.   Social History   Social History  . Marital status: Married    Spouse name: [name]  . Number of children: [name]  . Years of education: [name]   Social History Main Topics  . Smoking status: Never Smoker  . Smokeless tobacco: Never Used  . Alcohol use No  . Drug use: Unknown  . Sexual activity: Not Asked   Other Topics Concern  . None   Social History Narrative  . None     Family History:  The  patient's Family history is unknown by patient.   ROS:   Please see the history of present illness.    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   PHYSICAL EXAM:   VS:  BP 120/70 (BP Location: Right Arm)   Pulse (!) 58   Ht 5\' 10"  (1.778 m)   Wt 269 lb 1.9 oz (122.1 kg)   BMI 38.61 kg/m    GEN: Well nourished, well developed, in no acute distress  HEENT: normal  Neck: no JVD, carotid bruits, or masses Cardiac: RRR; no murmurs, rubs, or gallops,no edema  Respiratory:  clear to auscultation bilaterally, normal work of breathing GI: soft, nontender, nondistended, + BS MS:  no deformity or atrophy  Skin: warm and dry, no rash Neuro:  Alert and Oriented x 3, Strength and sensation are intact Psych: euthymic mood, full affect  Wt Readings from Last 3 Encounters:  05/30/17 269 lb 1.9 oz (122.1 kg)  04/29/17 258 lb 6.4 oz (117.2 kg)  04/23/17 250 lb 8 oz (113.6 kg)      Studies/Labs Reviewed:   EKG:  EKG not repeated   Labs: 04/19/2017: B Natriuretic Peptide 654.7; Magnesium 2.2 04/20/2017: TSH 1.974 04/22/2017: Hemoglobin 14.6; Platelets 195 04/29/2017: BUN 12; Creatinine, Ser 0.93; Potassium 4.5; Sodium 143   Lipid Panel    Component Value Date/Time   CHOL 159 04/20/2017 0224   TRIG 91 04/20/2017 0224   HDL 41 04/20/2017 0224   CHOLHDL 3.9 04/20/2017 0224   VLDL 18 04/20/2017 0224   LDLCALC 100 (H) 04/20/2017 0224    Additional studies/ records that were reviewed today include:  Cardiac catheterization results July 2018:  Conclusion    Normal left main.  Normal left anterior descending.  Normal circumflex.  Normal right coronary artery.  Severely elevated left ventricular end-diastolic pressure, with LVEDP of 33 mmHg. Hemodynamics are compatible with acute on chronic combined systolic and diastolic heart failure. EF by echo 30%.  RECOMMENDATIONS:   With elevated LVEDP, will give further doses of IV Lasix. The procedure was performed with the patient lying on a wedge because of dyspnea.  Guideline based therapy for HFrEF.     IMPRESSION:   1. Chronic combined systolic and diastolic heart failure (HCC)   2. Essential hypertension   3. S/P cardiac cath, patent coronary arteries 04/22/17   4. NICM (nonischemic cardiomyopathy) (HCC)   5. Sleep apnea, unspecified type      PLAN:  In order of problems listed above: 1. Uptitrate lisinopril to 10 mg per day. Follow-up appointment in 2 weeks with anticipation of further up titration of lisinopril. Basic metabolic panel at that time. 2. Heart failure therapy will help treat blood  pressure. 1. Not addressed 2. etiology still uncertain 3. Sleep study has been scheduled.  Overall. Not yet ready to return to work. Return in 2 weeks for up titration of ACE inhibitor therapy. We also need to optimize beta blocker therapy. We perhaps need to consider switching to carvedilol. I will see him back in one month.   Medication Adjustments/Labs and Tests Ordered: Current medicines are reviewed at length with the patient today.  Concerns regarding medicines are outlined above.  Medication changes, Labs and Tests ordered today are listed in the Patient Instructions below. Patient Instructions  Medication Instructions:  1) INCREASE Lisinopril 10mg  once daily  Labwork: BMET at the time of your next appointment.   Testing/Procedures: None  Follow-Up: Your physician recommends that you schedule a follow-up appointment in: 2 weeks with a PA or NP.  Your physician recommends that you schedule a follow-up appointment in: 4 weeks with Dr. Katrinka Blazing. (Can have 9/19 at 12PM)    Any Other Special Instructions Will Be Listed Below (If Applicable).     If you need a refill on your cardiac medications before your next appointment, please call your pharmacy.      Signed, Lesleigh Noe, MD  05/30/2017 10:31 AM    [facility] [address] [place], [place], [place]  [address] Phone: [phone]; Fax: [phone]

## 2017-05-30 ENCOUNTER — Ambulatory Visit (INDEPENDENT_AMBULATORY_CARE_PROVIDER_SITE_OTHER): Payer: PRIVATE HEALTH INSURANCE | Admitting: Interventional Cardiology

## 2017-05-30 ENCOUNTER — Encounter (INDEPENDENT_AMBULATORY_CARE_PROVIDER_SITE_OTHER): Payer: Self-pay

## 2017-05-30 ENCOUNTER — Encounter: Payer: Self-pay | Admitting: Interventional Cardiology

## 2017-05-30 VITALS — BP 120/70 | HR 58 | Ht 70.0 in | Wt 269.1 lb

## 2017-05-30 DIAGNOSIS — I428 Other cardiomyopathies: Secondary | ICD-10-CM

## 2017-05-30 DIAGNOSIS — I10 Essential (primary) hypertension: Secondary | ICD-10-CM

## 2017-05-30 DIAGNOSIS — Z9889 Other specified postprocedural states: Secondary | ICD-10-CM | POA: Diagnosis not present

## 2017-05-30 DIAGNOSIS — G4730 Sleep apnea, unspecified: Secondary | ICD-10-CM | POA: Diagnosis not present

## 2017-05-30 DIAGNOSIS — I5042 Chronic combined systolic (congestive) and diastolic (congestive) heart failure: Secondary | ICD-10-CM | POA: Diagnosis not present

## 2017-05-30 MED ORDER — LISINOPRIL 10 MG PO TABS: 10.0000 mg | ORAL_TABLET | Freq: Every day | ORAL | 3 refills | Status: DC

## 2017-05-30 NOTE — Patient Instructions (Addendum)
Medication Instructions:  1) INCREASE Lisinopril 10mg  once daily  Labwork: BMET at the time of your next appointment.   Testing/Procedures: None  Follow-Up: Your physician recommends that you schedule a follow-up appointment in: 2 weeks with a PA or NP.  Your physician recommends that you schedule a follow-up appointment in: 4 weeks with Dr. Katrinka Blazing. (Can have 9/19 at 12PM)    Any Other Special Instructions Will Be Listed Below (If Applicable).     If you need a refill on your cardiac medications before your next appointment, please call your pharmacy.

## 2017-06-05 ENCOUNTER — Telehealth: Payer: Self-pay | Admitting: Interventional Cardiology

## 2017-06-05 NOTE — Telephone Encounter (Signed)
New Message  Pt c/o swelling: STAT is pt has developed SOB within 24 hours  1. How long have you been experiencing swelling? 3 days  2. Where is the swelling located? both Feet and ankles   3.  Are you currently taking a "fluid pill"? Yes Lasix 40mg    4.  Are you currently SOB? No   5.  Have you traveled recently?   Pt phone died.

## 2017-06-05 NOTE — Telephone Encounter (Signed)
Follow up   Pt returning call , he is very confused about lab work, he is having half done at [facility] and half done in our office.   Please use his cellphone to reach him

## 2017-06-05 NOTE — Telephone Encounter (Signed)
Left message to call back  

## 2017-06-06 NOTE — Telephone Encounter (Signed)
Left message to call back  

## 2017-06-11 ENCOUNTER — Other Ambulatory Visit: Payer: PRIVATE HEALTH INSURANCE | Admitting: *Deleted

## 2017-06-11 LAB — LIPID PANEL
Chol/HDL Ratio: 3.3 ratio (ref 0.0–5.0)
Cholesterol, Total: 156 mg/dL (ref 100–199)
HDL: 47 mg/dL (ref >39)
LDL Calculated: 94 mg/dL (ref 0–99)
TRIGLYCERIDES: 77 mg/dL (ref 0–149)
VLDL Cholesterol Cal: 15 mg/dL (ref 5–40)

## 2017-06-11 LAB — HEPATIC FUNCTION PANEL
ALT: 34 IU/L (ref 0–44)
AST: 28 IU/L (ref 0–40)
Albumin: 4.6 g/dL (ref 3.5–5.5)
Alkaline Phosphatase: 87 IU/L (ref 39–117)
BILIRUBIN TOTAL: 0.7 mg/dL (ref 0.0–1.2)
Bilirubin, Direct: 0.16 mg/dL (ref 0.00–0.40)
Total Protein: 7.1 g/dL (ref 6.0–8.5)

## 2017-06-11 LAB — BASIC METABOLIC PANEL
BUN/Creatinine Ratio: 15 (ref 9–20)
BUN: 13 mg/dL (ref 6–24)
CO2: 26 mmol/L (ref 20–29)
Calcium: 9.6 mg/dL (ref 8.7–10.2)
Chloride: 98 mmol/L (ref 96–106)
Creatinine, Ser: 0.86 mg/dL (ref 0.76–1.27)
GFR, EST AFRICAN AMERICAN: 113 mL/min/{1.73_m2} (ref >59)
GFR, EST NON AFRICAN AMERICAN: 98 mL/min/{1.73_m2} (ref >59)
Glucose: 94 mg/dL (ref 65–99)
Potassium: 4 mmol/L (ref 3.5–5.2)
SODIUM: 142 mmol/L (ref 134–144)

## 2017-06-11 NOTE — Addendum Note (Signed)
Addended by: Tonita Phoenix on: 06/11/2017 11:01 AM   Modules accepted: Orders

## 2017-06-12 ENCOUNTER — Ambulatory Visit (INDEPENDENT_AMBULATORY_CARE_PROVIDER_SITE_OTHER): Payer: PRIVATE HEALTH INSURANCE | Admitting: Physician Assistant

## 2017-06-12 ENCOUNTER — Encounter (INDEPENDENT_AMBULATORY_CARE_PROVIDER_SITE_OTHER): Payer: Self-pay

## 2017-06-12 ENCOUNTER — Other Ambulatory Visit: Payer: PRIVATE HEALTH INSURANCE

## 2017-06-12 ENCOUNTER — Encounter: Payer: Self-pay | Admitting: Physician Assistant

## 2017-06-12 VITALS — BP 122/74 | HR 84 | Ht 70.75 in | Wt 278.1 lb

## 2017-06-12 DIAGNOSIS — I472 Ventricular tachycardia: Secondary | ICD-10-CM | POA: Diagnosis not present

## 2017-06-12 DIAGNOSIS — I428 Other cardiomyopathies: Secondary | ICD-10-CM

## 2017-06-12 DIAGNOSIS — I4729 Other ventricular tachycardia: Secondary | ICD-10-CM

## 2017-06-12 DIAGNOSIS — I5043 Acute on chronic combined systolic (congestive) and diastolic (congestive) heart failure: Secondary | ICD-10-CM | POA: Diagnosis not present

## 2017-06-12 DIAGNOSIS — R29818 Other symptoms and signs involving the nervous system: Secondary | ICD-10-CM

## 2017-06-12 MED ORDER — FUROSEMIDE 40 MG PO TABS: 80.0000 mg | ORAL_TABLET | Freq: Two times a day (BID) | ORAL | 3 refills | Status: DC

## 2017-06-12 MED ORDER — POTASSIUM CHLORIDE ER 10 MEQ PO TBCR: 20.0000 meq | EXTENDED_RELEASE_TABLET | Freq: Two times a day (BID) | ORAL | 3 refills | Status: DC

## 2017-06-12 NOTE — Patient Instructions (Addendum)
Medication Instructions:  1.  INCREASE Lasix to 40 mgs taking 2 tablets twice a day 2.  START Potassium 10 meq daily twice a day   Labwork: None ordered  Testing/Procedures: None ordered  Follow-Up: Your physician recommends that you schedule a follow-up appointment in: Friday 06/14/17 ARRIVE AT 9:15 TO SEE BRITTANY SIMMONS, PA-C   Any Other Special Instructions Will Be Listed Below (If Applicable).     If you need a refill on your cardiac medications before your next appointment, please call your pharmacy.  For patients with congestive heart failure, we give them these special instructions:  1. Follow a low-salt diet - you are allowed no more than 2,000mg  of sodium per day. Watch your fluid intake. In general, you should not be taking in more than 1.5 liters of fluid per day (no more than 6 glasses per day). This includes sources of water in foods like soup, coffee, tea, milk, etc. 2. Weigh yourself on the same scale at same time of day and keep a log. 3. Call your doctor: (Anytime you feel any of the following symptoms)  - 3lb weight gain overnight or 5lb within a few days - Shortness of breath, with or without a dry hacking cough  - Swelling in the hands, feet or stomach  - If you have to sleep on extra pillows at night in order to breathe   IT IS IMPORTANT TO LET YOUR DOCTOR KNOW EARLY ON IF YOU ARE HAVING SYMPTOMS SO WE CAN HELP YOU!

## 2017-06-12 NOTE — Progress Notes (Signed)
Cardiology Office Note    Date:  06/12/2017  ID:  [name], DOB 1961-03-31, MRN [id number] PCP:  Robert Quill, PA-C  Cardiologist:  Dr. Katrinka Blazing   Chief Complaint: med titration for CHF  History of Present Illness:  [name] is a 56 y.o. male with history of arthritis, recently diagnosed NICM, chronic combined CHF and suspected OSA who presents for f/u of CHF. He was admitted 04/2017 with acute CHF with unclear etiology. Cardiac cath 04/22/17 showed normal coronaries and elevated LVEDP . 2D Echo 04/21/17 showed EF 20-25% with severe diffuse HK, restrictive physiology, mod-severe LAE, mildly dilated RV, mild RAE, PASP . Guideline directed medical therapy for HF was recommended. Per notes, telemetry did demonstrate NSVT (14 beats then 5 beats). At last OV 05/30/17, lisinopril was increased to 10mg  per day. It was not certain on prior visits that his BP would tolerate Entresto. He's been on Lopressor 25mg  BID. F/u labs obtained yesterday showed K 4.0, Cr 0.86, normal LFTs, LDL 94; otherwise during recent admission TSH was wnl, A1C 5.8, Mg 2.2, UDS neg.   He returns for follow-up today. Overall he feels like his breathing is stable but reports recurrence of lower extremity edema. He's not sure how long this has been present. His weight has steadily been increasing - was 250lb at discharge from hospital, 258 in July follow-up, 269 at time of follow-up with Dr. Katrinka Blazing on 05/30/17 and is 278 by recheck scale here today. He denies any SOB or orthopnea. His chest feels more "full." He reports he's probably eating more salt than he should (i.e. [place] sausage). He thinks he's doing about 2L of fluid per day, possibly more. Endorses compliance with meds, taking Lasix around 12pm and 5pm more recently.    Past Medical History:  Diagnosis Date  . Arthritis   . Chronic combined systolic and diastolic heart failure (HCC) 04/19/2017   a. LHC 04/22/17 - Normal coronary arteries; LVEDP 33 //  Echo 04/21/17 - Mild LVH, EF 20-25, diff HK, restrictive physiology, mod to severe LAE, mild RVE, mild RAE, PASP 35.  [name] NICM (nonischemic cardiomyopathy) (HCC) 04/23/2017  . Obesity   . S/P cardiac cath, patent coronary arteries 04/22/17 04/23/2017  . Sleep apnea 04/23/2017    Past Surgical History:  Procedure Laterality Date  . KNEE SURGERY    . LEFT HEART CATH AND CORONARY ANGIOGRAPHY [name] 04/22/2017   Procedure: Left Heart Cath and Coronary Angiography;  Surgeon: Lyn Records, MD;  Location: [facility] INVASIVE CV LAB;  Service: Cardiovascular;  Laterality: [name];    Current Medications: Current Meds  Medication Sig  . acetaminophen (TYLENOL) 325 MG tablet Take 2 tablets (650 mg total) by mouth every 4 (four) hours as needed for headache or mild pain.  [name] aspirin 81 MG chewable tablet Chew 1 tablet (81 mg total) by mouth daily.  [name] atorvastatin (LIPITOR) 40 MG tablet Take 1 tablet (40 mg total) by mouth daily at 6 PM.  . furosemide (LASIX) 40 MG tablet Take 1 tablet (40 mg total) by mouth 2 (two) times daily.  [name] lisinopril (PRINIVIL,ZESTRIL) 10 MG tablet Take 1 tablet (10 mg total) by mouth daily.  . methocarbamol (ROBAXIN) 500 MG tablet Take 1 tablet (500 mg total) by mouth 2 (two) times daily.  . metoprolol tartrate (LOPRESSOR) 25 MG tablet Take 1 tablet (25 mg total) by mouth 2 (two) times daily.  . Multiple Vitamins-Minerals (MULTIVITAMIN ADULTS 50+ PO) Take 2 tablets by mouth daily.  [name]  naproxen sodium (ANAPROX) 220 MG tablet Take 880 mg by mouth every 6 (six) hours as needed (pain).  [name] OVER THE COUNTER MEDICATION Take 15 mLs by mouth daily. [employer]; for cough  . potassium chloride (K-DUR) 10 MEQ tablet Take 2 tablets (20 mEq total) by mouth daily.     Allergies:   Patient has no known allergies.   Social History   Social History  . Marital status: Married    Spouse name: [name]  . Number of children: [name]  . Years of education: [name]   Social History Main Topics  . Smoking status:  Never Smoker  . Smokeless tobacco: Never Used  . Alcohol use No  . Drug use: Unknown  . Sexual activity: Not Asked   Other Topics Concern  . None   Social History Narrative  . None     Family History:  Family History  Problem Relation Age of Onset  . Family history unknown: Yes    ROS:   Please see the history of present illness.  All other systems are reviewed and otherwise negative.    PHYSICAL EXAM:   VS:  BP 122/74   Pulse 84   Ht 5' 10.75" (1.797 m)   Wt 280 lb 12.8 oz (127.4 kg)   BMI 39.44 kg/m   BMI: Body mass index is 39.44 kg/m. GEN: Well nourished, well developed obese AAM, in no acute distress  HEENT: normocephalic, atraumatic Neck: no JVD, carotid bruits, or masses Cardiac: RRR; no murmurs, rubs, or gallops, 1-2+ bilateral lower edema to just below knee superimposed on baseline large leg habitus, pitting, no erythema or palpable cords Respiratory:  clear to auscultation bilaterally, normal work of breathing GI: soft, nontender, nondistended, + BS MS: no deformity or atrophy  Skin: warm and dry, no rash Neuro:  Alert and Oriented x 3, Strength and sensation are intact, follows commands Psych: euthymic mood, full affect  Wt Readings from Last 3 Encounters:  06/12/17 278 lb 1.9 oz (126.2 kg)  05/30/17 269 lb 1.9 oz (122.1 kg)  04/29/17 258 lb 6.4 oz (117.2 kg)      Studies/Labs Reviewed:   EKG:  EKG shows NSR 67bpm, left posterior fascicular block, TWI III, avF, no acute change from prior.  Recent Labs: 04/19/2017: B Natriuretic Peptide 654.7; Magnesium 2.2 04/20/2017: TSH 1.974 04/22/2017: Hemoglobin 14.6; Platelets 195 06/11/2017: ALT 34; BUN 13; Creatinine, Ser 0.86; Potassium 4.0; Sodium 142   Lipid Panel    Component Value Date/Time   CHOL 156 06/11/2017 1101   TRIG 77 06/11/2017 1101   HDL 47 06/11/2017 1101   CHOLHDL 3.3 06/11/2017 1101   CHOLHDL 3.9 04/20/2017 0224   VLDL 18 04/20/2017 0224   LDLCALC 94 06/11/2017 1101    Additional  studies/ records that were reviewed today include: Summarized above    ASSESSMENT & PLAN:   1. Acute on chronic combined CHF - I am concerned for significant volume overload. Weight 250->258->269->278. There may be a component of increased general body weight here given his obesity, but he also presents with 1-2+ bilateral lower extremity edema. Reports compliance with meds but possibly going overboard on sodium/fluid. Reviewed daily weights, low sodium diet, fluid restriction with patient. I discussed with Dr. Katrinka Blazing over the phone. I feel he needs diuresis before we can further optimize his medication regimen. Per our discussion, will double his Lasix dose to 80mg  BID and increase potassium dose to BID with early follow-up in 48 hours. If he is not  diuresing significantly may need to consider readmission as we have to be cautious given his tendency towards softer blood pressure. Once volume status is more stable, would transition Lopressor to Toprol and consider addition of spironolactone. Once more cliincally stable will need to determine timing of f/u echo to reassess LV function. May need to consider Advanced HF referral. Warning sx reviewed. 2. NICM - as above. Etiology unclear. Denies ETOH, UDS negative, no sig family history, no preceding viral illness. 3. Suspected sleep apnea - sleep study pending for later this month. 4. NSVT - no recent symptoms of dizziness or syncope. Continue beta blocker.  Disposition: F/u with APP in 48 hours (was planning on 72 hours but that would fall over the weekend).   Medication Adjustments/Labs and Tests Ordered: Current medicines are reviewed at length with the patient today.  Concerns regarding medicines are outlined above. Medication changes, Labs and Tests ordered today are summarized above and listed in the Patient Instructions accessible in Encounters.   Signed, Laurann Montana, PA-C  06/12/2017 3:07 PM    [facility] [address] [place], [place], [place]  [address] Phone: [phone]; Fax: [phone]

## 2017-06-12 NOTE — Telephone Encounter (Signed)
Pt seeing Ronie Spies, PA-C today

## 2017-06-13 NOTE — Addendum Note (Signed)
Addended by: Briscoe Deutscher R on: 06/13/2017 04:47 PM   Modules accepted: Orders

## 2017-06-14 ENCOUNTER — Encounter: Payer: Self-pay | Admitting: Cardiology

## 2017-06-14 ENCOUNTER — Encounter (INDEPENDENT_AMBULATORY_CARE_PROVIDER_SITE_OTHER): Payer: Self-pay

## 2017-06-14 ENCOUNTER — Ambulatory Visit (INDEPENDENT_AMBULATORY_CARE_PROVIDER_SITE_OTHER): Payer: PRIVATE HEALTH INSURANCE | Admitting: Cardiology

## 2017-06-14 VITALS — BP 108/74 | HR 62 | Ht 70.75 in | Wt 272.1 lb

## 2017-06-14 DIAGNOSIS — Z79899 Other long term (current) drug therapy: Secondary | ICD-10-CM | POA: Diagnosis not present

## 2017-06-14 DIAGNOSIS — I5043 Acute on chronic combined systolic (congestive) and diastolic (congestive) heart failure: Secondary | ICD-10-CM

## 2017-06-14 MED ORDER — METOPROLOL SUCCINATE ER 50 MG PO TB24: 50.0000 mg | ORAL_TABLET | Freq: Every day | ORAL | 4 refills | Status: DC

## 2017-06-14 NOTE — Patient Instructions (Signed)
Medication Instructions: Your physician has recommended you make the following change in your medication:  -1) STOP Metoprolol Tartrate (lopressor) -2) START Metoprolol Succinate (Toprol XL) - Take 1 tablet (50 mg) by mouth daily  Labwork: Your physician recommends that you return for lab work on 06/19/17 - BMET  Procedures/Testing: None Ordered  Follow-Up: Your physician recommends that you schedule a follow-up appointment in: 2 WEEKS with the Pharmacists to titrate Heart Failure medication  Your physician recommends that you schedule a follow-up appointment in: 4-6 WEEKS with and APP on Dr. Lonn Georgia Team   If you need a refill on your cardiac medications before your next appointment, please call your pharmacy.

## 2017-06-14 NOTE — Progress Notes (Signed)
06/14/2017 [name]   04-08-1961  [id number]  Primary Physician [name], PA-C Primary Cardiologist: Dr. Katrinka Blazing   Reason for Visit/CC: F/u for Acute on Chronic Systolic HF  HPI:  [name] is a 56 y.o. male, followed by Dr. Katrinka Blazing who presents to clinic today for 48 hour follow-up after being seen recently on 06/12/2017 by Lucile Crater, PA-C. He was felt to be in acute on chronic systolic heart failure and his diuretics were adjusted.  In summary, he was recently diagnosed with nonischemic cardiomyopathy, combined systolic and diastolic heart failure. He was admitted 04/2017 with acute CHF with unclear etiology. Cardiac cath 04/22/17 showed normal coronaries and elevated LVEDP . 2D Echo 04/21/17 showed EF 20-25% with severe diffuse HK, restrictive physiology, mod-severe LAE, mildly dilated RV, mild RAE, PASP . Guideline directed medical therapy for HF was recommended. He was placed on Lopressor, with the intention to later transition along acting Metroprolol as well as ACE inhibitor therapy with lisinopril. It was felt that his BP would not tolerate addition of Entresto at time of hospital d/c. He was also placed on Lasix for fluid management.  He was seen on 06/12/2017 for follow-up. His weight was noted to be up to 278 pounds. His discharge weight from the hospital in July was 250 pounds. Patient was visibly volume overloaded with lower extremity pitting edema on examination. He admitted to dietary indiscretion with sodium. Lasix dose was increased to 40 mg twice a day and he was started on supplemental potassium, 10 mEq twice a day.  In follow-up, he reports some improvement. His lower extremity edema is improving. He still has 1+ bilateral edema on exam. His weight is down from 278 pounds to 272 pounds. He has mild exertional dyspnea but denies resting dyspnea. This has also improved. He sleeps with one pillow. No orthopnea or PND. He has adjusted his diet and is  restricting his sodium and fluid intake. He has also been checking his weight at home and is noticing a downward trend. His blood pressure has tolerated the increased dose of Lasix. His blood pressure is 108/74. He dose not feel lightheaded or dizzy.    Current Meds  Medication Sig  . acetaminophen (TYLENOL) 325 MG tablet Take 2 tablets (650 mg total) by mouth every 4 (four) hours as needed for headache or mild pain.  [name] aspirin 81 MG chewable tablet Chew 1 tablet (81 mg total) by mouth daily.  [name] atorvastatin (LIPITOR) 40 MG tablet Take 1 tablet (40 mg total) by mouth daily at 6 PM.  . furosemide (LASIX) 40 MG tablet Take 2 tablets (80 mg total) by mouth 2 (two) times daily.  [name] lisinopril (PRINIVIL,ZESTRIL) 10 MG tablet Take 1 tablet (10 mg total) by mouth daily.  . methocarbamol (ROBAXIN) 500 MG tablet Take 1 tablet (500 mg total) by mouth 2 (two) times daily.  . metoprolol tartrate (LOPRESSOR) 25 MG tablet Take 1 tablet (25 mg total) by mouth 2 (two) times daily.  . Multiple Vitamins-Minerals (MULTIVITAMIN ADULTS 50+ PO) Take 2 tablets by mouth daily.  . naproxen sodium (ANAPROX) 220 MG tablet Take 880 mg by mouth every 6 (six) hours as needed (pain).  . potassium chloride (K-DUR) 10 MEQ tablet Take 2 tablets (20 mEq total) by mouth 2 (two) times daily.   No Known Allergies Past Medical History:  Diagnosis Date  . Arthritis   . Chronic combined systolic and diastolic heart failure (HCC) 04/19/2017   a. LHC 04/22/17 -  Normal coronary arteries; LVEDP 33 // Echo 04/21/17 - Mild LVH, EF 20-25, diff HK, restrictive physiology, mod to severe LAE, mild RVE, mild RAE, PASP 35.  [name] NICM (nonischemic cardiomyopathy) (HCC) 04/23/2017  . Obesity   . S/P cardiac cath, patent coronary arteries 04/22/17 04/23/2017  . Sleep apnea 04/23/2017   Family History  Problem Relation Age of Onset  . Family history unknown: Yes   Past Surgical History:  Procedure Laterality Date  . KNEE SURGERY    . LEFT HEART  CATH AND CORONARY ANGIOGRAPHY [name] 04/22/2017   Procedure: Left Heart Cath and Coronary Angiography;  Surgeon: Lyn Records, MD;  Location: [facility] INVASIVE CV LAB;  Service: Cardiovascular;  Laterality: [name];   Social History   Social History  . Marital status: Married    Spouse name: [name]  . Number of children: [name]  . Years of education: [name]   Occupational History  . Not on file.   Social History Main Topics  . Smoking status: Never Smoker  . Smokeless tobacco: Never Used  . Alcohol use No  . Drug use: Unknown  . Sexual activity: Not on file   Other Topics Concern  . Not on file   Social History Narrative  . No narrative on file     Review of Systems: General: negative for chills, fever, night sweats or weight changes.  Cardiovascular: negative for chest pain, dyspnea on exertion, edema, orthopnea, palpitations, paroxysmal nocturnal dyspnea or shortness of breath Dermatological: negative for rash Respiratory: negative for cough or wheezing Urologic: negative for hematuria Abdominal: negative for nausea, vomiting, diarrhea, bright red blood per rectum, melena, or hematemesis Neurologic: negative for visual changes, syncope, or dizziness All other systems reviewed and are otherwise negative except as noted above.   Physical Exam:  Blood pressure 108/74, pulse 62, height 5' 10.75" (1.797 m), weight 272 lb 1.9 oz (123.4 kg).  General appearance: alert, cooperative, no distress and morbidly obese Neck: no carotid bruit and no JVD Lungs: clear to auscultation bilaterally Heart: regular rate and rhythm, S1, S2 normal, no murmur, click, rub or gallop Extremities: 1+ bilateral LEE Pulses: 2+ and symmetric Skin: Skin color, texture, turgor normal. No rashes or lesions Neurologic: Grossly normal  EKG not performed -- personally reviewed   ASSESSMENT AND PLAN:   1. Chronic Combined Systolic and Diastolic HF/ NICM: EF 20-25%. Lasix was increased 06/12/17 to 40 mg BID given acute  exacerbation/ volume overload. He is responding well. His weigh is down 6 lb from 278>>272. No resting dyspnea, orthopnea or PND. LEE is improving. BP is stable. We will continue 40 mg BID. Recheck BMP middle of next week. Continue with sodium and fluid restriction and daily weights. We will transition from Lopressor to Toprol XL for HF. Continue lisinopril. F/u in HTN clinic in 1-2 weeks for BP assessment and further titration of HF meds. If BP is stable, may consider transition to [facility]. F/u with APP in 4-6 weeks. He is ultimately going to need a f/u echo, 3 months after being on maximally tolerated medical therapy for systolic HF. If EF remains <35%, he will need referral to EP clinic for consideration for ICD.     Aislee Landgren Delmer Islam, MHS [facility] 06/14/2017 9:48 AM

## 2017-06-19 ENCOUNTER — Other Ambulatory Visit: Payer: PRIVATE HEALTH INSURANCE | Admitting: *Deleted

## 2017-06-19 DIAGNOSIS — Z79899 Other long term (current) drug therapy: Secondary | ICD-10-CM

## 2017-06-19 DIAGNOSIS — I5043 Acute on chronic combined systolic (congestive) and diastolic (congestive) heart failure: Secondary | ICD-10-CM

## 2017-06-20 LAB — BASIC METABOLIC PANEL
BUN/Creatinine Ratio: 12 (ref 9–20)
BUN: 11 mg/dL (ref 6–24)
CALCIUM: 9.7 mg/dL (ref 8.7–10.2)
CO2: 26 mmol/L (ref 20–29)
Chloride: 100 mmol/L (ref 96–106)
Creatinine, Ser: 0.9 mg/dL (ref 0.76–1.27)
GFR calc Af Amer: 111 mL/min/{1.73_m2} (ref >59)
GFR calc non Af Amer: 96 mL/min/{1.73_m2} (ref >59)
Glucose: 93 mg/dL (ref 65–99)
Potassium: 4.3 mmol/L (ref 3.5–5.2)
Sodium: 143 mmol/L (ref 134–144)

## 2017-06-25 ENCOUNTER — Encounter (HOSPITAL_COMMUNITY): Payer: Self-pay | Admitting: *Deleted

## 2017-06-25 ENCOUNTER — Telehealth: Payer: Self-pay | Admitting: Interventional Cardiology

## 2017-06-25 ENCOUNTER — Emergency Department (HOSPITAL_COMMUNITY): Payer: PRIVATE HEALTH INSURANCE

## 2017-06-25 ENCOUNTER — Inpatient Hospital Stay (HOSPITAL_COMMUNITY)
Admission: EM | Admit: 2017-06-25 | Discharge: 2017-06-28 | DRG: 392 | Disposition: A | Discharge status: Home / Self Care | Payer: PRIVATE HEALTH INSURANCE | Attending: Family Medicine | Admitting: Family Medicine

## 2017-06-25 DIAGNOSIS — R109 Unspecified abdominal pain: Secondary | ICD-10-CM

## 2017-06-25 DIAGNOSIS — K2970 Gastritis, unspecified, without bleeding: Secondary | ICD-10-CM | POA: Diagnosis not present

## 2017-06-25 DIAGNOSIS — I10 Essential (primary) hypertension: Secondary | ICD-10-CM | POA: Diagnosis present

## 2017-06-25 DIAGNOSIS — G4730 Sleep apnea, unspecified: Secondary | ICD-10-CM | POA: Diagnosis present

## 2017-06-25 DIAGNOSIS — I428 Other cardiomyopathies: Secondary | ICD-10-CM | POA: Diagnosis present

## 2017-06-25 DIAGNOSIS — R1013 Epigastric pain: Secondary | ICD-10-CM

## 2017-06-25 DIAGNOSIS — I110 Hypertensive heart disease with heart failure: Secondary | ICD-10-CM | POA: Diagnosis present

## 2017-06-25 DIAGNOSIS — Z7982 Long term (current) use of aspirin: Secondary | ICD-10-CM

## 2017-06-25 DIAGNOSIS — I5042 Chronic combined systolic (congestive) and diastolic (congestive) heart failure: Secondary | ICD-10-CM | POA: Diagnosis present

## 2017-06-25 DIAGNOSIS — I5022 Chronic systolic (congestive) heart failure: Secondary | ICD-10-CM | POA: Diagnosis present

## 2017-06-25 DIAGNOSIS — K529 Noninfective gastroenteritis and colitis, unspecified: Secondary | ICD-10-CM | POA: Diagnosis present

## 2017-06-25 HISTORY — DX: Migraine, unspecified, not intractable, without status migrainosus: G43.909

## 2017-06-25 HISTORY — DX: Personal history of other diseases of the musculoskeletal system and connective tissue: Z87.39

## 2017-06-25 LAB — BASIC METABOLIC PANEL
Anion gap: 13 (ref 5–15)
BUN: 19 mg/dL (ref 6–20)
CO2: 24 mmol/L (ref 22–32)
CREATININE: 1.13 mg/dL (ref 0.61–1.24)
Calcium: 9.8 mg/dL (ref 8.9–10.3)
Chloride: 101 mmol/L (ref 101–111)
GFR calc Af Amer: >60 mL/min (ref >60)
Glucose, Bld: 112 mg/dL — ABNORMAL HIGH (ref 65–99)
POTASSIUM: 3.9 mmol/L (ref 3.5–5.1)
Sodium: 138 mmol/L (ref 135–145)

## 2017-06-25 LAB — CBC
HEMATOCRIT: 43 % (ref 39.0–52.0)
Hemoglobin: 14.8 g/dL (ref 13.0–17.0)
MCH: 30.1 pg (ref 26.0–34.0)
MCHC: 34.4 g/dL (ref 30.0–36.0)
MCV: 87.6 fL (ref 78.0–100.0)
Platelets: 195 10*3/uL (ref 150–400)
RBC: 4.91 MIL/uL (ref 4.22–5.81)
RDW: 12.6 % (ref 11.5–15.5)
WBC: 8.5 10*3/uL (ref 4.0–10.5)

## 2017-06-25 LAB — BRAIN NATRIURETIC PEPTIDE: B Natriuretic Peptide: 88.2 pg/mL (ref 0.0–100.0)

## 2017-06-25 LAB — HEPATIC FUNCTION PANEL
ALT: 28 U/L (ref 17–63)
AST: 29 U/L (ref 15–41)
Albumin: 4.5 g/dL (ref 3.5–5.0)
Alkaline Phosphatase: 80 U/L (ref 38–126)
BILIRUBIN DIRECT: 0.2 mg/dL (ref 0.1–0.5)
BILIRUBIN TOTAL: 1.2 mg/dL (ref 0.3–1.2)
Indirect Bilirubin: 1 mg/dL — ABNORMAL HIGH (ref 0.3–0.9)
Total Protein: 7.6 g/dL (ref 6.5–8.1)

## 2017-06-25 LAB — LIPASE, BLOOD: Lipase: 38 U/L (ref 11–51)

## 2017-06-25 LAB — TROPONIN I: Troponin I: <0.03 ng/mL (ref <0.03)

## 2017-06-25 LAB — I-STAT TROPONIN, ED: Troponin i, poc: 0.02 ng/mL (ref 0.00–0.08)

## 2017-06-25 MED ORDER — GI COCKTAIL ~~LOC~~
30.0000 mL | Freq: Once | ORAL | Status: AC
  Administered 2017-06-25: 30 mL via ORAL
  Filled 2017-06-25: qty 30

## 2017-06-25 MED ORDER — RANITIDINE HCL 150 MG PO TABS: 150.0000 mg | ORAL_TABLET | Freq: Two times a day (BID) | ORAL | 0 refills | Status: DC

## 2017-06-25 MED ORDER — IOPAMIDOL (ISOVUE-300) INJECTION 61%
INTRAVENOUS | Status: AC
  Filled 2017-06-25: qty 100

## 2017-06-25 MED ORDER — HYDROMORPHONE HCL 1 MG/ML IJ SOLN
1.0000 mg | Freq: Once | INTRAMUSCULAR | Status: AC
  Administered 2017-06-26: 1 mg via INTRAVENOUS
  Filled 2017-06-25: qty 1

## 2017-06-25 MED ORDER — SODIUM CHLORIDE 0.9 % IV BOLUS (SEPSIS)
1000.0000 mL | Freq: Once | INTRAVENOUS | Status: AC
Start: 2017-06-25 — End: 2017-06-26
  Administered 2017-06-26: 1000 mL via INTRAVENOUS

## 2017-06-25 MED ORDER — RANITIDINE HCL 150 MG/10ML PO SYRP
150.0000 mg | ORAL_SOLUTION | Freq: Once | ORAL | Status: AC
  Administered 2017-06-25: 150 mg via ORAL
  Filled 2017-06-25: qty 10

## 2017-06-25 MED ORDER — PANTOPRAZOLE SODIUM 20 MG PO TBEC: 20.0000 mg | DELAYED_RELEASE_TABLET | Freq: Every day | ORAL | 0 refills | Status: DC

## 2017-06-25 MED ORDER — ACETAMINOPHEN 325 MG PO TABS: 650.0000 mg | ORAL_TABLET | ORAL | 0 refills | Status: DC | PRN

## 2017-06-25 NOTE — Telephone Encounter (Signed)
Patient calling complaining about abdominal pain that is going up to his chest and states he is having CP. Operator stated patient was on his way to our office. Explained to patient that we do not take walk-ins and if he feels like this is something urgent he needs to go to the ED. Patient stated "I might die before I get there." Patient's girlfriend is driving patient right now, encouraged them to go to ED, especially if the patient feels like he is going to die. Informed patient that the ED would be able to evaluate him to determine what is the cause of his pain. Patient did not say anything after this, but his girlfriend stated okay.

## 2017-06-25 NOTE — ED Triage Notes (Signed)
Pt is here with mid upper abdominal pain that radiates into his chest which started this am. Pt states the pain comes in waves of intensity.

## 2017-06-25 NOTE — Progress Notes (Deleted)
Cardiology Office Note    Date:  06/25/2017   ID:  [name], [name] Mar 11, 1961, MRN [id number]  PCP:  [name], PA-C  Cardiologist: Lesleigh Noe, MD   No chief complaint on file.   History of Present Illness:  [name] is a 56 y.o. male with recently diagnosed non ischemic cardiomyopathy, combined systolic and diastolic heart failure, and obstructive sleep apnea presenting for follow-up.     Past Medical History:  Diagnosis Date  . Arthritis   . Chronic combined systolic and diastolic heart failure (HCC) 04/19/2017   a. LHC 04/22/17 - Normal coronary arteries; LVEDP 33 // Echo 04/21/17 - Mild LVH, EF 20-25, diff HK, restrictive physiology, mod to severe LAE, mild RVE, mild RAE, PASP 35.  [name] NICM (nonischemic cardiomyopathy) (HCC) 04/23/2017  . Obesity   . S/P cardiac cath, patent coronary arteries 04/22/17 04/23/2017  . Sleep apnea 04/23/2017    Past Surgical History:  Procedure Laterality Date  . KNEE SURGERY    . LEFT HEART CATH AND CORONARY ANGIOGRAPHY [name] 04/22/2017   Procedure: Left Heart Cath and Coronary Angiography;  Surgeon: Lyn Records, MD;  Location: [facility] INVASIVE CV LAB;  Service: Cardiovascular;  Laterality: [name];    Current Medications: Outpatient Medications Prior to Visit  Medication Sig Dispense Refill  . acetaminophen (TYLENOL) 325 MG tablet Take 2 tablets (650 mg total) by mouth every 4 (four) hours as needed for headache or mild pain.    [name] aspirin 81 MG chewable tablet Chew 1 tablet (81 mg total) by mouth daily.    [name] atorvastatin (LIPITOR) 40 MG tablet Take 1 tablet (40 mg total) by mouth daily at 6 PM. 30 tablet 6  . furosemide (LASIX) 40 MG tablet Take 2 tablets (80 mg total) by mouth 2 (two) times daily. 120 tablet 3  . lisinopril (PRINIVIL,ZESTRIL) 10 MG tablet Take 1 tablet (10 mg total) by mouth daily. 90 tablet 3  . methocarbamol (ROBAXIN) 500 MG tablet Take 1 tablet (500 mg total) by mouth 2 (two) times daily. 20 tablet 0    . metoprolol succinate (TOPROL-XL) 50 MG 24 hr tablet Take 1 tablet (50 mg total) by mouth daily. 30 tablet 4  . Multiple Vitamins-Minerals (MULTIVITAMIN ADULTS 50+ PO) Take 2 tablets by mouth daily.    . naproxen sodium (ANAPROX) 220 MG tablet Take 880 mg by mouth every 6 (six) hours as needed (pain).    . potassium chloride (K-DUR) 10 MEQ tablet Take 2 tablets (20 mEq total) by mouth 2 (two) times daily. 120 tablet 3   No facility-administered medications prior to visit.      Allergies:   Patient has no known allergies.   Social History   Social History  . Marital status: Married    Spouse name: [name]  . Number of children: [name]  . Years of education: [name]   Social History Main Topics  . Smoking status: Never Smoker  . Smokeless tobacco: Never Used  . Alcohol use No  . Drug use: No  . Sexual activity: Not on file   Other Topics Concern  . Not on file   Social History Narrative  . No narrative on file     Family History:  The patient's ***Family history is unknown by patient.   ROS:   Please see the history of present illness.    ***  All other systems reviewed and are negative.   PHYSICAL EXAM:   VS:  There  were no vitals taken for this visit.   GEN: Well nourished, well developed, in no acute distress  HEENT: normal  Neck: no JVD, carotid bruits, or masses Cardiac: ***RRR; no murmurs, rubs, or gallops,no edema  Respiratory:  clear to auscultation bilaterally, normal work of breathing GI: soft, nontender, nondistended, + BS MS: no deformity or atrophy  Skin: warm and dry, no rash Neuro:  Alert and Oriented x 3, Strength and sensation are intact Psych: euthymic mood, full affect  Wt Readings from Last 3 Encounters:  06/14/17 272 lb 1.9 oz (123.4 kg)  06/12/17 278 lb 1.9 oz (126.2 kg)  05/30/17 269 lb 1.9 oz (122.1 kg)      Studies/Labs Reviewed:   EKG:  EKG  ***  Recent Labs: 04/19/2017: B Natriuretic Peptide 654.7; Magnesium 2.2 04/20/2017: TSH  1.974 06/11/2017: ALT 34 06/25/2017: BUN 19; Creatinine, Ser 1.13; Hemoglobin 14.8; Platelets 195; Potassium 3.9; Sodium 138   Lipid Panel    Component Value Date/Time   CHOL 156 06/11/2017 1101   TRIG 77 06/11/2017 1101   HDL 47 06/11/2017 1101   CHOLHDL 3.3 06/11/2017 1101   CHOLHDL 3.9 04/20/2017 0224   VLDL 18 04/20/2017 0224   LDLCALC 94 06/11/2017 1101    Additional studies/ records that were reviewed today include:  ***    ASSESSMENT:    1. Chronic combined systolic and diastolic heart failure (HCC)   2. Essential hypertension   3. Sleep apnea, unspecified type      PLAN:  In order of problems listed above:  1. ***    Medication Adjustments/Labs and Tests Ordered: Current medicines are reviewed at length with the patient today.  Concerns regarding medicines are outlined above.  Medication changes, Labs and Tests ordered today are listed in the Patient Instructions below. There are no Patient Instructions on file for this visit.   Signed, Lesleigh Noe, MD  06/25/2017 6:24 PM    [facility] [address] [place], [place], [place]  [address] Phone: [phone]; Fax: [phone]

## 2017-06-25 NOTE — Telephone Encounter (Signed)
New Message     Pt c/o of Chest Pain: STAT if CP now or developed within 24 hours  1. Are you having CP right now?  Yes   2. Are you experiencing any other symptoms (ex. SOB, nausea, vomiting, sweating)?  No   3. How long have you been experiencing CP? This morning   4. Is your CP continuous or coming and going? Getting worse , was coming and going and became continuous  5. Have you taken Nitroglycerin? no ?

## 2017-06-25 NOTE — ED Provider Notes (Addendum)
Emergency Department Provider Note   I have reviewed the triage vital signs and the nursing notes.   HISTORY  Chief Complaint Chest Pain   HPI [name] is a 56 y.o. male with PMH of heart failure and obesity but clean cath in July here with epigastric pain that started after taking his pills this morning. States it is sharp in nature and stabs but does not radiate. No cough, fever, sob, or vomiting but has had nausea. No h/o same. No trauma. Has not had anything to eat since then aside from a sandwich immediately afterwards. Has not taken anything for the pain.  No associated urinary symptoms, rash, or recent illnesses.  No gas since this morning. Worsening pain since then. No BM since then. Usually has multiple a day.    Past Medical History:  Diagnosis Date  . Arthritis   . Chronic combined systolic and diastolic heart failure (HCC) 04/19/2017   a. LHC 04/22/17 - Normal coronary arteries; LVEDP 33 // Echo 04/21/17 - Mild LVH, EF 20-25, diff HK, restrictive physiology, mod to severe LAE, mild RVE, mild RAE, PASP 35.  [name] NICM (nonischemic cardiomyopathy) (HCC) 04/23/2017  . Obesity   . S/P cardiac cath, patent coronary arteries 04/22/17 04/23/2017  . Sleep apnea 04/23/2017    Patient Active Problem List   Diagnosis Date Noted  . NICM (nonischemic cardiomyopathy) (HCC) 04/23/2017  . S/P cardiac cath, patent coronary arteries 04/22/17 04/23/2017  . Sleep apnea 04/23/2017  . Hypertension 04/20/2017  . Premature atrial beats 04/20/2017  . NSVT (nonsustained ventricular tachycardia) (HCC)   . Chronic combined systolic and diastolic heart failure (HCC) 04/19/2017    Past Surgical History:  Procedure Laterality Date  . KNEE SURGERY    . LEFT HEART CATH AND CORONARY ANGIOGRAPHY [name] 04/22/2017   Procedure: Left Heart Cath and Coronary Angiography;  Surgeon: Lyn Records, MD;  Location: [facility] INVASIVE CV LAB;  Service: Cardiovascular;  Laterality: [name];    Current Outpatient Rx   . Order #: [id number] Class: No Print  . Order #: [id number] Class: Normal  . Order #: [id number] Class: Normal  . Order #: [id number] Class: Normal  . Order #: [id number] Class: Print  . Order #: [id number] Class: Normal  . Order #: [id number] Class: Historical Med  . Order #: [id number] Class: Historical Med  . Order #: [id number] Class: Normal  . Order #: [id number] Class: Print  . Order #: [id number] Class: Print  . Order #: [id number] Class: Print    Allergies Patient has no known allergies.  Family History  Problem Relation Age of Onset  . Family history unknown: Yes    Social History Social History  Substance Use Topics  . Smoking status: Never Smoker  . Smokeless tobacco: Never Used  . Alcohol use No    Review of Systems  All other systems negative except as documented in the HPI. All pertinent positives and negatives as reviewed in the HPI. ____________________________________________   PHYSICAL EXAM:  VITAL SIGNS: ED Triage Vitals  Enc Vitals Group     BP 06/25/17 1614 134/86     Pulse Rate 06/25/17 1614 76     Resp 06/25/17 1614 18     Temp 06/25/17 1614 (!) 97.3 F (36.3 C)     Temp Source 06/25/17 1614 Oral     SpO2 06/25/17 1614 96 %     Weight --      Height --      Head Circumference --      Peak Flow --  Pain Score 06/25/17 1613 3     Pain Loc --      Pain Edu? --      Excl. in GC? --     Constitutional: Alert and oriented. Well appearing and in no acute distress. Eyes: Conjunctivae are normal. PERRL. EOMI. Head: Atraumatic. Nose: No congestion/rhinnorhea. Mouth/Throat: Mucous membranes are moist.  Oropharynx non-erythematous. Neck: No stridor.  No meningeal signs.   Cardiovascular: Normal rate, regular rhythm. Good peripheral circulation. Grossly normal heart sounds.   Respiratory: Normal respiratory effort.  No retractions. Lungs CTAB. Gastrointestinal: Soft with mild epigastric ttp. No distention.  Musculoskeletal: No lower extremity tenderness nor  edema. No gross deformities of extremities. Neurologic:  Normal speech and language. No gross focal neurologic deficits are appreciated.  Skin:  Skin is warm, dry and intact. No rash noted.   ____________________________________________   LABS (all labs ordered are listed, but only abnormal results are displayed)  Labs Reviewed  BASIC METABOLIC PANEL - Abnormal; Notable for the following:       Result Value   Glucose, Bld 112 (*)    All other components within normal limits  HEPATIC FUNCTION PANEL - Abnormal; Notable for the following:    Indirect Bilirubin 1.0 (*)    All other components within normal limits  CBC  LIPASE, BLOOD  TROPONIN I  BRAIN NATRIURETIC PEPTIDE  I-STAT TROPONIN, ED   ____________________________________________  EKG   EKG Interpretation  Date/Time:  Tuesday June 25 2017 16:11:33 EDT Ventricular Rate:  68 PR Interval:  170 QRS Duration: 86 QT Interval:  408 QTC Calculation: 433 R Axis:   143 Text Interpretation:  Normal sinus rhythm Left posterior fascicular block Anterior infarct , age undetermined Abnormal ECG No significant change since last tracing in July Confirmed by Marily Memos [phone]) on 06/25/2017 9:36:31 PM       ____________________________________________  RADIOLOGY  Dg Chest 2 View  Result Date: 06/25/2017 CLINICAL DATA:  Epigastric and chest pain. EXAM: CHEST  2 VIEW COMPARISON:  04/19/2017 . FINDINGS: Mediastinum and hilar structures are normal. Cardiomegaly with normal pulmonary vascularity. No focal infiltrate. No pleural effusion or pneumothorax. IMPRESSION: 1. Cardiomegaly.  No pulmonary venous congestion. 2. No acute pulmonary disease. Electronically Signed   By: Maisie Fus  Register   On: 06/25/2017 16:43    ____________________________________________   PROCEDURES  Procedure(s) performed:   Procedures   ____________________________________________   INITIAL IMPRESSION / ASSESSMENT AND PLAN / ED  COURSE  Pertinent labs & imaging results that were available during my care of the patient were reviewed by me and considered in my medical decision making (see chart for details).  Suspect PUD as the cause for his symptoms. Less likely pancreatitis/cholecystitis/cardiac with normal labs and ecg but with persistent pain so will get CT scan to eval for perforation or other emergent causes for his symptoms. If negative, stable for discharge home.   Ct w/ e/o enteritis vs early obstruction. Persistent pain, hasn't passed gas since BM this AM, persistent nausea and vomiting. Will put in NG tube, admit for observation.  Discussed with Dr. Julian Reil who will admit.    ____________________________________________  FINAL CLINICAL IMPRESSION(S) / ED DIAGNOSES  Final diagnoses:  Epigastric pain     MEDICATIONS GIVEN DURING THIS VISIT:  Medications  HYDROmorphone (DILAUDID) injection 1 mg (not administered)  sodium chloride 0.9 % bolus 1,000 mL (not administered)  gi cocktail (Maalox,Lidocaine,Donnatal) (30 mLs Oral Given 06/25/17 2208)  ranitidine (ZANTAC) 150 MG/10ML syrup 150 mg (150 mg Oral Given 06/25/17  2208)     NEW OUTPATIENT MEDICATIONS STARTED DURING THIS VISIT:  New Prescriptions   PANTOPRAZOLE (PROTONIX) 20 MG TABLET    Take 1 tablet (20 mg total) by mouth daily.   RANITIDINE (ZANTAC) 150 MG TABLET    Take 1 tablet (150 mg total) by mouth 2 (two) times daily.    Note:  This document was prepared using Dragon voice recognition software and may include unintentional dictation errors.     Tekeyah Santiago, Barbara Cower, MD 06/26/17 [phone]

## 2017-06-26 ENCOUNTER — Telehealth: Payer: Self-pay | Admitting: Interventional Cardiology

## 2017-06-26 ENCOUNTER — Ambulatory Visit: Payer: PRIVATE HEALTH INSURANCE | Admitting: Interventional Cardiology

## 2017-06-26 ENCOUNTER — Emergency Department (HOSPITAL_COMMUNITY): Payer: PRIVATE HEALTH INSURANCE

## 2017-06-26 DIAGNOSIS — I10 Essential (primary) hypertension: Secondary | ICD-10-CM | POA: Diagnosis not present

## 2017-06-26 DIAGNOSIS — K529 Noninfective gastroenteritis and colitis, unspecified: Secondary | ICD-10-CM | POA: Diagnosis present

## 2017-06-26 DIAGNOSIS — I5022 Chronic systolic (congestive) heart failure: Secondary | ICD-10-CM | POA: Diagnosis present

## 2017-06-26 DIAGNOSIS — K2970 Gastritis, unspecified, without bleeding: Secondary | ICD-10-CM | POA: Diagnosis present

## 2017-06-26 DIAGNOSIS — I428 Other cardiomyopathies: Secondary | ICD-10-CM | POA: Diagnosis present

## 2017-06-26 DIAGNOSIS — R1013 Epigastric pain: Secondary | ICD-10-CM | POA: Diagnosis not present

## 2017-06-26 DIAGNOSIS — Z7982 Long term (current) use of aspirin: Secondary | ICD-10-CM | POA: Diagnosis not present

## 2017-06-26 DIAGNOSIS — G4730 Sleep apnea, unspecified: Secondary | ICD-10-CM | POA: Diagnosis present

## 2017-06-26 DIAGNOSIS — I5042 Chronic combined systolic (congestive) and diastolic (congestive) heart failure: Secondary | ICD-10-CM | POA: Diagnosis not present

## 2017-06-26 DIAGNOSIS — I110 Hypertensive heart disease with heart failure: Secondary | ICD-10-CM | POA: Diagnosis present

## 2017-06-26 LAB — BASIC METABOLIC PANEL
ANION GAP: 13 (ref 5–15)
BUN: 16 mg/dL (ref 6–20)
CHLORIDE: 105 mmol/L (ref 101–111)
CO2: 21 mmol/L — AB (ref 22–32)
Calcium: 9.2 mg/dL (ref 8.9–10.3)
Creatinine, Ser: 0.98 mg/dL (ref 0.61–1.24)
GFR calc non Af Amer: >60 mL/min (ref >60)
Glucose, Bld: 125 mg/dL — ABNORMAL HIGH (ref 65–99)
POTASSIUM: 4.1 mmol/L (ref 3.5–5.1)
SODIUM: 139 mmol/L (ref 135–145)

## 2017-06-26 MED ORDER — HYDROMORPHONE HCL 1 MG/ML IJ SOLN
0.5000 mg | INTRAMUSCULAR | Status: DC | PRN
  Administered 2017-06-26: 0.5 mg via INTRAVENOUS
  Filled 2017-06-26: qty 1

## 2017-06-26 MED ORDER — METOPROLOL SUCCINATE ER 50 MG PO TB24
50.0000 mg | ORAL_TABLET | Freq: Every day | ORAL | Status: DC
  Administered 2017-06-26 – 2017-06-28 (×3): 50 mg via ORAL
  Filled 2017-06-26 (×3): qty 1
  Filled 2017-06-26: qty 2

## 2017-06-26 MED ORDER — ASPIRIN 81 MG PO CHEW
81.0000 mg | CHEWABLE_TABLET | Freq: Every day | ORAL | Status: DC
  Administered 2017-06-26 – 2017-06-28 (×3): 81 mg via ORAL
  Filled 2017-06-26 (×3): qty 1

## 2017-06-26 MED ORDER — METHOCARBAMOL 500 MG PO TABS
500.0000 mg | ORAL_TABLET | Freq: Two times a day (BID) | ORAL | Status: DC
  Administered 2017-06-26 – 2017-06-28 (×6): 500 mg via ORAL
  Filled 2017-06-26 (×6): qty 1

## 2017-06-26 MED ORDER — GI COCKTAIL ~~LOC~~: 30.0000 mL | Freq: Three times a day (TID) | ORAL | Status: DC | PRN

## 2017-06-26 MED ORDER — POLYETHYLENE GLYCOL 3350 17 G PO PACK
17.0000 g | PACK | Freq: Every day | ORAL | Status: DC
  Administered 2017-06-26 – 2017-06-28 (×3): 17 g via ORAL
  Filled 2017-06-26 (×3): qty 1

## 2017-06-26 MED ORDER — POTASSIUM CHLORIDE CRYS ER 20 MEQ PO TBCR
20.0000 meq | EXTENDED_RELEASE_TABLET | Freq: Two times a day (BID) | ORAL | Status: DC
  Administered 2017-06-26 – 2017-06-28 (×5): 20 meq via ORAL
  Filled 2017-06-26 (×5): qty 1

## 2017-06-26 MED ORDER — ENOXAPARIN SODIUM 40 MG/0.4ML ~~LOC~~ SOLN
40.0000 mg | Freq: Every day | SUBCUTANEOUS | Status: DC
  Administered 2017-06-26 – 2017-06-27 (×2): 40 mg via SUBCUTANEOUS
  Filled 2017-06-26 (×3): qty 0.4

## 2017-06-26 MED ORDER — ATORVASTATIN CALCIUM 40 MG PO TABS
40.0000 mg | ORAL_TABLET | Freq: Every day | ORAL | Status: DC
  Administered 2017-06-26 – 2017-06-27 (×2): 40 mg via ORAL
  Filled 2017-06-26 (×3): qty 1

## 2017-06-26 MED ORDER — BISACODYL 10 MG RE SUPP
10.0000 mg | Freq: Once | RECTAL | Status: AC
  Administered 2017-06-26: 10 mg via RECTAL
  Filled 2017-06-26: qty 1

## 2017-06-26 MED ORDER — SODIUM CHLORIDE 0.9 % IV SOLN
INTRAVENOUS | Status: DC
  Administered 2017-06-26 (×2): via INTRAVENOUS
  Administered 2017-06-27: 75 mL/h via INTRAVENOUS
  Administered 2017-06-28: 01:00:00 via INTRAVENOUS

## 2017-06-26 MED ORDER — LISINOPRIL 10 MG PO TABS
10.0000 mg | ORAL_TABLET | Freq: Every day | ORAL | Status: DC
  Administered 2017-06-26 – 2017-06-28 (×3): 10 mg via ORAL
  Filled 2017-06-26 (×3): qty 1

## 2017-06-26 MED ORDER — ONDANSETRON HCL 4 MG/2ML IJ SOLN
4.0000 mg | Freq: Four times a day (QID) | INTRAMUSCULAR | Status: DC | PRN
  Administered 2017-06-26 (×2): 4 mg via INTRAVENOUS
  Filled 2017-06-26 (×2): qty 2

## 2017-06-26 MED ORDER — METOCLOPRAMIDE HCL 5 MG/ML IJ SOLN
10.0000 mg | Freq: Once | INTRAMUSCULAR | Status: AC
  Administered 2017-06-26: 10 mg via INTRAVENOUS
  Filled 2017-06-26: qty 2

## 2017-06-26 MED ORDER — ACETAMINOPHEN 325 MG PO TABS: 650.0000 mg | ORAL_TABLET | Freq: Four times a day (QID) | ORAL | Status: DC | PRN

## 2017-06-26 MED ORDER — LIDOCAINE HCL 2 % EX GEL
1.0000 "application " | Freq: Once | CUTANEOUS | Status: AC
  Administered 2017-06-26: 1 via TOPICAL

## 2017-06-26 MED ORDER — ACETAMINOPHEN 650 MG RE SUPP: 650.0000 mg | Freq: Four times a day (QID) | RECTAL | Status: DC | PRN

## 2017-06-26 MED ORDER — HYDROMORPHONE HCL 1 MG/ML IJ SOLN
0.5000 mg | INTRAMUSCULAR | Status: DC | PRN
  Administered 2017-06-26 (×3): 0.5 mg via INTRAVENOUS
  Filled 2017-06-26 (×3): qty 1

## 2017-06-26 MED ORDER — PANTOPRAZOLE SODIUM 40 MG IV SOLR
40.0000 mg | Freq: Two times a day (BID) | INTRAVENOUS | Status: DC
  Administered 2017-06-26 – 2017-06-27 (×3): 40 mg via INTRAVENOUS
  Filled 2017-06-26 (×3): qty 40

## 2017-06-26 MED ORDER — ONDANSETRON HCL 4 MG PO TABS
4.0000 mg | ORAL_TABLET | Freq: Four times a day (QID) | ORAL | Status: DC | PRN
Start: 2017-06-26 — End: 2017-06-28

## 2017-06-26 NOTE — Telephone Encounter (Signed)
New Message:     Pt is in the hospital,please call when you have time.

## 2017-06-26 NOTE — ED Notes (Addendum)
Pt resting in bed with wife at bedside

## 2017-06-26 NOTE — H&P (Signed)
History and Physical    [name]:[id number] DOB: 09-Mar-1961 DOA: 06/25/2017  PCP: [name], PA-C  Patient coming from: Home  I have personally briefly reviewed patient's old medical records in [facility]  Chief Complaint: Abd pain  HPI: [name] is a 56 y.o. male with medical history significant of NICM.  Patient presents to the ED with c/o epigastric abdominal pain.  Onset after taking pills this AM.  Sharp and stabbing in nature.  No radiation.  Nothing to eat since that time.  Has taken nothing from pain.  No sick contacts.  No gas since this AM, has had BM this AM none since then.   ED Course: CT shows enteritis vs early obstruction.  NGT placed with minimal output so NGT removed.  Pain improved with dilaudid.   Review of Systems: As per HPI otherwise 10 point review of systems negative.   Past Medical History:  Diagnosis Date  . Arthritis   . Chronic combined systolic and diastolic heart failure (HCC) 04/19/2017   a. LHC 04/22/17 - Normal coronary arteries; LVEDP 33 // Echo 04/21/17 - Mild LVH, EF 20-25, diff HK, restrictive physiology, mod to severe LAE, mild RVE, mild RAE, PASP 35.  [name] NICM (nonischemic cardiomyopathy) (HCC) 04/23/2017  . Obesity   . S/P cardiac cath, patent coronary arteries 04/22/17 04/23/2017  . Sleep apnea 04/23/2017    Past Surgical History:  Procedure Laterality Date  . KNEE SURGERY    . LEFT HEART CATH AND CORONARY ANGIOGRAPHY [name] 04/22/2017   Procedure: Left Heart Cath and Coronary Angiography;  Surgeon: Lyn Records, MD;  Location: [facility] INVASIVE CV LAB;  Service: Cardiovascular;  Laterality: [name];     reports that he has never smoked. He has never used smokeless tobacco. He reports that he does not drink alcohol or use drugs.  No Known Allergies  Family History  Problem Relation Age of Onset  . Family history unknown: Yes     Prior to Admission medications   Medication Sig Start Date End Date Taking?  Authorizing Provider  aspirin 81 MG chewable tablet Chew 1 tablet (81 mg total) by mouth daily. 04/24/17  Yes Leone Brand, NP  atorvastatin (LIPITOR) 40 MG tablet Take 1 tablet (40 mg total) by mouth daily at 6 PM. 04/23/17  Yes Leone Brand, NP  furosemide (LASIX) 40 MG tablet Take 2 tablets (80 mg total) by mouth 2 (two) times daily. 06/12/17 09/10/17 Yes Dunn, Dayna N, PA-C  lisinopril (PRINIVIL,ZESTRIL) 10 MG tablet Take 1 tablet (10 mg total) by mouth daily. 05/30/17 08/28/17 Yes Lyn Records, MD  methocarbamol (ROBAXIN) 500 MG tablet Take 1 tablet (500 mg total) by mouth 2 (two) times daily. 12/10/16  Yes Garlon Hatchet, PA-C  metoprolol succinate (TOPROL-XL) 50 MG 24 hr tablet Take 1 tablet (50 mg total) by mouth daily. 06/14/17  Yes Robbie Lis M, PA-C  Multiple Vitamins-Minerals (MULTIVITAMIN ADULTS 50+ PO) Take 2 tablets by mouth daily.   Yes [provider]  naproxen sodium (ANAPROX) 220 MG tablet Take 880 mg by mouth every 6 (six) hours as needed (pain).   Yes [provider]  potassium chloride (K-DUR) 10 MEQ tablet Take 2 tablets (20 mEq total) by mouth 2 (two) times daily. 06/12/17 09/10/17 Yes Dunn, Dayna N, PA-C  acetaminophen (TYLENOL) 325 MG tablet Take 2 tablets (650 mg total) by mouth every 4 (four) hours as needed for headache or mild pain. 06/25/17  Mesner, Barbara Cower, MD  pantoprazole (PROTONIX) 20 MG tablet Take 1 tablet (20 mg total) by mouth daily. 06/25/17   Mesner, Barbara Cower, MD  ranitidine (ZANTAC) 150 MG tablet Take 1 tablet (150 mg total) by mouth 2 (two) times daily. 06/25/17   Mesner, Barbara Cower, MD    Physical Exam: Vitals:   06/25/17 2300 06/26/17 0000 06/26/17 0015 06/26/17 0045  BP: 135/78 130/83 118/67 125/70  Pulse: 69 63 (!) 134 71  Resp: 18 17 (!) 22 18  Temp:      TempSrc:      SpO2: 99% 98% 100% 97%    Constitutional: NAD, calm, comfortable Eyes: PERRL, lids and conjunctivae normal ENMT: Mucous membranes are moist. Posterior pharynx clear  of any exudate or lesions.Normal dentition.  Neck: normal, supple, no masses, no thyromegaly Respiratory: clear to auscultation bilaterally, no wheezing, no crackles. Normal respiratory effort. No accessory muscle use.  Cardiovascular: Regular rate and rhythm, no murmurs / rubs / gallops. No extremity edema. 2+ pedal pulses. No carotid bruits.  Abdomen: no tenderness, no masses palpated. No hepatosplenomegaly. Bowel sounds positive.  Musculoskeletal: no clubbing / cyanosis. No joint deformity upper and lower extremities. Good ROM, no contractures. Normal muscle tone.  Skin: no rashes, lesions, ulcers. No induration Neurologic: CN 2-12 grossly intact. Sensation intact, DTR normal. Strength 5/5 in all 4.  Psychiatric: Normal judgment and insight. Alert and oriented x 3. Normal mood.    Labs on Admission: I have personally reviewed following labs and imaging studies  CBC:  Recent Labs Lab 06/25/17 1617  WBC 8.5  HGB 14.8  HCT 43.0  MCV 87.6  PLT 195   Basic Metabolic Panel:  Recent Labs Lab 06/19/17 1500 06/25/17 1617  NA 143 138  K 4.3 3.9  CL 100 101  CO2 26 24  GLUCOSE 93 112*  BUN 11 19  CREATININE 0.90 1.13  CALCIUM 9.7 9.8   GFR: Estimated Creatinine Clearance: 98.4 mL/min (by C-G formula based on SCr of 1.13 mg/dL). Liver Function Tests:  Recent Labs Lab 06/25/17 2204  AST 29  ALT 28  ALKPHOS 80  BILITOT 1.2  PROT 7.6  ALBUMIN 4.5    Recent Labs Lab 06/25/17 2204  LIPASE 38   No results for input(s): AMMONIA in the last 168 hours. Coagulation Profile: No results for input(s): INR, PROTIME in the last 168 hours. Cardiac Enzymes:  Recent Labs Lab 06/25/17 2204  TROPONINI <0.03   BNP (last 3 results) No results for input(s): PROBNP in the last 8760 hours. HbA1C: No results for input(s): HGBA1C in the last 72 hours. CBG: No results for input(s): GLUCAP in the last 168 hours. Lipid Profile: No results for input(s): CHOL, HDL, LDLCALC, TRIG,  CHOLHDL, LDLDIRECT in the last 72 hours. Thyroid Function Tests: No results for input(s): TSH, T4TOTAL, FREET4, T3FREE, THYROIDAB in the last 72 hours. Anemia Panel: No results for input(s): VITAMINB12, FOLATE, FERRITIN, TIBC, IRON, RETICCTPCT in the last 72 hours. Urine analysis:    Component Value Date/Time   COLORURINE STRAW (A) 04/20/2017 0206   APPEARANCEUR CLEAR 04/20/2017 0206   LABSPEC 1.005 04/20/2017 0206   PHURINE 7.0 04/20/2017 0206   GLUCOSEU NEGATIVE 04/20/2017 0206   HGBUR NEGATIVE 04/20/2017 0206   BILIRUBINUR NEGATIVE 04/20/2017 0206   KETONESUR NEGATIVE 04/20/2017 0206   PROTEINUR NEGATIVE 04/20/2017 0206   NITRITE NEGATIVE 04/20/2017 0206   LEUKOCYTESUR NEGATIVE 04/20/2017 0206    Radiological Exams on Admission: Dg Chest 2 View  Result Date: 06/25/2017 CLINICAL DATA:  Epigastric  and chest pain. EXAM: CHEST  2 VIEW COMPARISON:  04/19/2017 . FINDINGS: Mediastinum and hilar structures are normal. Cardiomegaly with normal pulmonary vascularity. No focal infiltrate. No pleural effusion or pneumothorax. IMPRESSION: 1. Cardiomegaly.  No pulmonary venous congestion. 2. No acute pulmonary disease. Electronically Signed   By: Maisie Fus  Register   On: 06/25/2017 16:43   Ct Renal Stone Study  Result Date: 06/26/2017 CLINICAL DATA:  Upper abdominal pain radiating to the chest. EXAM: CT ABDOMEN AND PELVIS WITHOUT CONTRAST TECHNIQUE: Multidetector CT imaging of the abdomen and pelvis was performed following the standard protocol without IV contrast. COMPARISON:  None. FINDINGS: Lower chest: The lung bases are clear. Hepatobiliary: No focal liver abnormality is seen. No gallstones, gallbladder wall thickening, or biliary dilatation. Pancreas: Unremarkable. No pancreatic ductal dilatation or surrounding inflammatory changes. Spleen: Normal in size without focal abnormality. Adrenals/Urinary Tract: No adrenal gland nodules. Kidneys are symmetrical in size. subcentimeter exophytic lesion  from the right kidney likely represents a cyst. No hydronephrosis or hydroureter. No renal, ureteral, or bladder stones. No bladder wall thickening. Stomach/Bowel: Stomach is unremarkable. Mildly dilated fluid-filled mid abdominal small bowel with mild mesenteric edema. This could represent early obstruction or enteritis. Colon is decompressed and is diffusely stool-filled. Appendix is normal. Vascular/Lymphatic: Scattered calcifications in the aorta. No aneurysm. No retroperitoneal lymphadenopathy. Reproductive: Prostate is unremarkable. Other: No free air in the abdomen. Abdominal wall musculature appears intact. Musculoskeletal: Degenerative changes in the lumbar spine with degenerative disc disease at L4-5 and L5-S1. No destructive bone lesions. IMPRESSION: 1. No renal or ureteral stone or obstruction. 2. Mildly dilated fluid-filled mid abdominal small bowel with mesenteric edema. Changes may represent early obstruction or enteritis. Electronically Signed   By: Burman Nieves M.D.   On: 06/26/2017 00:23    EKG: Independently reviewed.  Assessment/Plan Principal Problem:   Enteritis Active Problems:   Chronic combined systolic and diastolic heart failure (HCC)   Hypertension    1. Enteritis - DDx includes obstruction though this seems less likely with BM earlier today, minimal output from NGT, no h/o abd surgeries 1. NPO except sips with meds for now 2. IVF: 1L in ED and NS at 75 cc/hr 3. Zofran PRN nausea 4. Dilaudid PRN pain 5. Since basically no output, will pull NGT for now 2. HTN - continue home meds 3. Chronic CHF -  1. Hold lasix 2. Watch for fluid overload with IVF above  DVT prophylaxis: Lovenox Code Status: Full Family Communication: Family at bedside Disposition Plan: Place in obs Consults called: None Admission status: Place in [place], [place]. DO [facility] Pager [phone]  If 7AM-7PM, please contact day team taking care of  patient [email or web address] Password [email or web address]  06/26/2017, 2:15 AM

## 2017-06-26 NOTE — Telephone Encounter (Signed)
Left message to call back  

## 2017-06-26 NOTE — Progress Notes (Signed)
Patient admitted after midnight, please see H&P.  Patient with no abdominal surgical history.  NPO except sips with meds.  Ambulate.  IVF.  Xray in AM.  No sign of appendicitis, no GB disease.  Pain control.  Bowel regimen,  Marlin Canary DO

## 2017-06-26 NOTE — ED Notes (Signed)
Report attempted x 1

## 2017-06-26 NOTE — ED Notes (Signed)
Pt placed on hospital bed for comfort, visitor given a recliner.  Pt expressed no needs, concerns or problems at this time.

## 2017-06-27 ENCOUNTER — Inpatient Hospital Stay (HOSPITAL_COMMUNITY): Payer: PRIVATE HEALTH INSURANCE

## 2017-06-27 DIAGNOSIS — R1013 Epigastric pain: Secondary | ICD-10-CM

## 2017-06-27 DIAGNOSIS — I5042 Chronic combined systolic (congestive) and diastolic (congestive) heart failure: Secondary | ICD-10-CM

## 2017-06-27 DIAGNOSIS — I10 Essential (primary) hypertension: Secondary | ICD-10-CM

## 2017-06-27 LAB — CBC
HEMATOCRIT: 39.1 % (ref 39.0–52.0)
Hemoglobin: 13 g/dL (ref 13.0–17.0)
MCH: 29.6 pg (ref 26.0–34.0)
MCHC: 33.2 g/dL (ref 30.0–36.0)
MCV: 89.1 fL (ref 78.0–100.0)
Platelets: 161 10*3/uL (ref 150–400)
RBC: 4.39 MIL/uL (ref 4.22–5.81)
RDW: 12.7 % (ref 11.5–15.5)
WBC: 6.8 10*3/uL (ref 4.0–10.5)

## 2017-06-27 LAB — BASIC METABOLIC PANEL
ANION GAP: 5 (ref 5–15)
BUN: 12 mg/dL (ref 6–20)
CHLORIDE: 107 mmol/L (ref 101–111)
CO2: 26 mmol/L (ref 22–32)
CREATININE: 1.05 mg/dL (ref 0.61–1.24)
Calcium: 8.9 mg/dL (ref 8.9–10.3)
GFR calc Af Amer: >60 mL/min (ref >60)
GFR calc non Af Amer: >60 mL/min (ref >60)
GLUCOSE: 83 mg/dL (ref 65–99)
Potassium: 3.9 mmol/L (ref 3.5–5.1)
Sodium: 138 mmol/L (ref 135–145)

## 2017-06-27 MED ORDER — DICYCLOMINE HCL 10 MG PO CAPS
10.0000 mg | ORAL_CAPSULE | Freq: Three times a day (TID) | ORAL | Status: AC
  Administered 2017-06-27 (×2): 10 mg via ORAL
  Filled 2017-06-27 (×2): qty 1

## 2017-06-27 MED ORDER — MORPHINE SULFATE (PF) 2 MG/ML IV SOLN: 1.0000 mg | INTRAVENOUS | Status: DC | PRN

## 2017-06-27 MED ORDER — ENOXAPARIN SODIUM 60 MG/0.6ML ~~LOC~~ SOLN
60.0000 mg | Freq: Every day | SUBCUTANEOUS | Status: DC
  Administered 2017-06-28: 09:00:00 60 mg via SUBCUTANEOUS
  Filled 2017-06-27: qty 0.6

## 2017-06-27 MED ORDER — FAMOTIDINE IN NACL 20-0.9 MG/50ML-% IV SOLN
20.0000 mg | Freq: Two times a day (BID) | INTRAVENOUS | Status: DC
  Administered 2017-06-27 – 2017-06-28 (×3): 20 mg via INTRAVENOUS
  Filled 2017-06-27 (×4): qty 50

## 2017-06-27 MED ORDER — FUROSEMIDE 80 MG PO TABS
80.0000 mg | ORAL_TABLET | Freq: Two times a day (BID) | ORAL | Status: DC
  Administered 2017-06-27 – 2017-06-28 (×2): 80 mg via ORAL
  Filled 2017-06-27 (×2): qty 1

## 2017-06-27 NOTE — Progress Notes (Signed)
[facility] paged concerning Cardiac Monitoring it was ordered 06/25/17. Ilean Skill LPN

## 2017-06-27 NOTE — Telephone Encounter (Signed)
Spoke with pt and he said he was just calling because he had to cancel his appt the other day d/t being in the hospital.  Advised pt to keep appt on 10/5.  Pt verbalized understanding and was in agreement with this plan.

## 2017-06-27 NOTE — Progress Notes (Signed)
PROGRESS NOTE [facility]   [name]   BMW:[id number] DOB: October 08, 1961  DOA: 06/25/2017 PCP: [name], PA-C   Brief Narrative:  [name] a 56 year old male with medical history significant for normal ischemic cardiomyopathy. Presented to the ED complaining of epigastric abdominal pain. Patient was admitted CT shows enteritis versus early obstruction, NG tube was placed with minimal output therefore was removed. Patient was placed nothing by mouth and pain medications.  Subjective: Patient seen and examined abdominal pain has improved, but still present, patient asking for something to drink. No flatus, small liquid BM this AM. Afebrile. Denies N/V   Assessment & Plan: Abdminal pain Unclear etiology at this time CT of the abdomen concerning for early bowel obstruction versus enteritis Repeat abdominal x-ray shows increase in gas pattern. Will add Pepcid, Bentyl and start clear liquids and advance as tolerated  Continue gentle hydration for now - monitor for fluid overload  No need for abx at this time. No WBC or fevers  If improving likely d/c in AM.   HTN  BP soft  Will monitor for now  Continue meds for now   Chronic systolic CHF  EF in 7/18 20-25% No signs of fluid overload  Continue home medications  Will resume Lasix to avoid overload  Monitor daily weight   DVT prophylaxis: Lovenox  Code Status: Full Code  Family Communication: Wife bedside  Disposition Plan: Home next 24 hrs   Consultants:   None   Procedures:   None   Antimicrobials: Anti-infectives    None         Objective: Vitals:   06/27/17 0549 06/27/17 0551 06/27/17 0808 06/27/17 0830  BP:  (!) 91/39    Pulse:  (!) 52  68  Resp:  17    Temp: 98.6 F (37 C) 98.6 F (37 C)    TempSrc:  Oral    SpO2:  97%    Weight:   120.1 kg (264 lb 12.8 oz)   Height:        Intake/Output Summary (Last 24 hours) at 06/27/17 1311 Last data filed at 06/27/17 1251  Gross  per 24 hour  Intake          2056.25 ml  Output              300 ml  Net          1756.25 ml   Filed Weights   06/26/17 1751 06/27/17 0808  Weight: 122.8 kg (270 lb 12.8 oz) 120.1 kg (264 lb 12.8 oz)    Examination:  General exam: Appears calm and comfortable  HEENT: AC/AT, PERRLA, OP moist and clear Respiratory system: Clear to auscultation. No wheezes,crackle or rhonchi Cardiovascular system: S1 & S2 heard, RRR. No JVD, murmurs, rubs or gallops Gastrointestinal system: Abdomen is nondistended, soft and nontender. No organomegaly or masses felt. Normal bowel sounds heard. Central nervous system: Alert and oriented. No focal neurological deficits. Extremities: No pedal edema. Symmetric, strength 5/5   Skin: No rashes, lesions or ulcers Psychiatry: Judgement and insight appear normal. Mood & affect appropriate.    Data Reviewed: I have personally reviewed following labs and imaging studies  CBC:  Recent Labs Lab 06/25/17 1617 06/27/17 0406  WBC 8.5 6.8  HGB 14.8 13.0  HCT 43.0 39.1  MCV 87.6 89.1  PLT 195 161   Basic Metabolic Panel:  Recent Labs Lab 06/25/17 1617 06/26/17 1044 06/27/17 0406  NA 138 139 138  K 3.9 4.1  3.9  CL 101 105 107  CO2 24 21* 26  GLUCOSE 112* 125* 83  BUN CREATININE 1.13 0.98 1.05  CALCIUM 9.8 9.2 8.9   GFR: Estimated Creatinine Clearance: 103.2 mL/min (by C-G formula based on SCr of 1.05 mg/dL). Liver Function Tests:  Recent Labs Lab 06/25/17 2204  AST 29  ALT 28  ALKPHOS 80  BILITOT 1.2  PROT 7.6  ALBUMIN 4.5    Recent Labs Lab 06/25/17 2204  LIPASE 38   No results for input(s): AMMONIA in the last 168 hours. Coagulation Profile: No results for input(s): INR, PROTIME in the last 168 hours. Cardiac Enzymes:  Recent Labs Lab 06/25/17 2204  TROPONINI <0.03   BNP (last 3 results) No results for input(s): PROBNP in the last 8760 hours. HbA1C: No results for input(s): HGBA1C in the last 72  hours. CBG: No results for input(s): GLUCAP in the last 168 hours. Lipid Profile: No results for input(s): CHOL, HDL, LDLCALC, TRIG, CHOLHDL, LDLDIRECT in the last 72 hours. Thyroid Function Tests: No results for input(s): TSH, T4TOTAL, FREET4, T3FREE, THYROIDAB in the last 72 hours. Anemia Panel: No results for input(s): VITAMINB12, FOLATE, FERRITIN, TIBC, IRON, RETICCTPCT in the last 72 hours. Sepsis Labs: No results for input(s): PROCALCITON, LATICACIDVEN in the last 168 hours.  No results found for this or any previous visit (from the past 240 hour(s)).    Radiology Studies: Dg Chest 2 View  Result Date: 06/25/2017 CLINICAL DATA:  Epigastric and chest pain. EXAM: CHEST  2 VIEW COMPARISON:  04/19/2017 . FINDINGS: Mediastinum and hilar structures are normal. Cardiomegaly with normal pulmonary vascularity. No focal infiltrate. No pleural effusion or pneumothorax. IMPRESSION: 1. Cardiomegaly.  No pulmonary venous congestion. 2. No acute pulmonary disease. Electronically Signed   By: Maisie Fus  Register   On: 06/25/2017 16:43   Dg Abd Portable 1v  Result Date: 06/27/2017 CLINICAL DATA:  Abdominal pain. EXAM: PORTABLE ABDOMEN - 1 VIEW COMPARISON:  CT 06/26/2017 . FINDINGS: Soft tissue structures are unremarkable. Slight distended air-filled loops of small bowel again noted loops small bowel again noted. Colonic gas pattern is unremarkable. No free air. O acute bony abnormality. Calcified pelvic density consistent phlebolith again noted . IMPRESSION: Slight distended air-filled loops of small bowel again noted. Similar findings noted on prior CT . Continued follow-up exams can be obtained to demonstrate resolution Electronically Signed   By: Maisie Fus  Register   On: 06/27/2017 08:40   Ct Renal Stone Study  Result Date: 06/26/2017 CLINICAL DATA:  Upper abdominal pain radiating to the chest. EXAM: CT ABDOMEN AND PELVIS WITHOUT CONTRAST TECHNIQUE: Multidetector CT imaging of the abdomen and pelvis was  performed following the standard protocol without IV contrast. COMPARISON:  None. FINDINGS: Lower chest: The lung bases are clear. Hepatobiliary: No focal liver abnormality is seen. No gallstones, gallbladder wall thickening, or biliary dilatation. Pancreas: Unremarkable. No pancreatic ductal dilatation or surrounding inflammatory changes. Spleen: Normal in size without focal abnormality. Adrenals/Urinary Tract: No adrenal gland nodules. Kidneys are symmetrical in size. subcentimeter exophytic lesion from the right kidney likely represents a cyst. No hydronephrosis or hydroureter. No renal, ureteral, or bladder stones. No bladder wall thickening. Stomach/Bowel: Stomach is unremarkable. Mildly dilated fluid-filled mid abdominal small bowel with mild mesenteric edema. This could represent early obstruction or enteritis. Colon is decompressed and is diffusely stool-filled. Appendix is normal. Vascular/Lymphatic: Scattered calcifications in the aorta. No aneurysm. No retroperitoneal lymphadenopathy. Reproductive: Prostate is unremarkable. Other: No free air in the abdomen. Abdominal  wall musculature appears intact. Musculoskeletal: Degenerative changes in the lumbar spine with degenerative disc disease at L4-5 and L5-S1. No destructive bone lesions. IMPRESSION: 1. No renal or ureteral stone or obstruction. 2. Mildly dilated fluid-filled mid abdominal small bowel with mesenteric edema. Changes may represent early obstruction or enteritis. Electronically Signed   By: Burman Nieves M.D.   On: 06/26/2017 00:23      Scheduled Meds: . aspirin  81 mg Oral Daily  . atorvastatin  40 mg Oral q1800  . [START ON 06/28/2017] enoxaparin (LOVENOX) injection  60 mg Subcutaneous Daily  . lisinopril  10 mg Oral Daily  . methocarbamol  500 mg Oral BID  . metoprolol succinate  50 mg Oral Daily  . polyethylene glycol  17 g Oral Daily  . potassium chloride SA  20 mEq Oral BID   Continuous Infusions: . sodium chloride 75  mL/hr (06/27/17 0546)  . famotidine (PEPCID) IV Stopped (06/27/17 1321)     LOS: 1 day    Time spent: Total of 25 minutes spent with pt, greater than 50% of which was spent in discussion of  treatment, counseling and coordination of care    Latrelle Dodrill, MD Pager: Text Page via [email or web address]   If 7PM-7AM, please contact night-coverage [email or web address] 06/27/2017, 1:11 PM

## 2017-06-28 ENCOUNTER — Encounter (HOSPITAL_COMMUNITY): Payer: Self-pay | Admitting: General Practice

## 2017-06-28 DIAGNOSIS — K529 Noninfective gastroenteritis and colitis, unspecified: Secondary | ICD-10-CM

## 2017-06-28 MED ORDER — RANITIDINE HCL 150 MG PO TABS: 150.0000 mg | ORAL_TABLET | Freq: Two times a day (BID) | ORAL | 0 refills | Status: DC

## 2017-06-28 MED ORDER — FAMOTIDINE 20 MG PO TABS: 20.0000 mg | ORAL_TABLET | Freq: Two times a day (BID) | ORAL | Status: DC

## 2017-06-28 MED ORDER — PANTOPRAZOLE SODIUM 20 MG PO TBEC: 20.0000 mg | DELAYED_RELEASE_TABLET | Freq: Every day | ORAL | 0 refills | Status: DC

## 2017-06-28 NOTE — Progress Notes (Signed)
Patient said that he doesn't have any ride until 6 PM.

## 2017-06-28 NOTE — Progress Notes (Signed)
Patient has an order to be D/C home but he wants to eat first and is waiting for his tray. The kitchen sent a wrong tray.

## 2017-06-28 NOTE — Progress Notes (Signed)
Patient was discharged home by MD order; discharged instructions  review and give to patient with care notes and prescriptions; IV DIC; patient will be escorted to the car by nurse tech via wheelchair.  

## 2017-06-28 NOTE — Discharge Summary (Signed)
Physician Discharge Summary  [name]  ONG:[id number]  DOB: Mar 02, 1961  DOA: 06/25/2017 PCP: [name], PA-C  Admit date: 06/25/2017 Discharge date: 06/28/2017  Admitted From: Home  Disposition:  Home   Recommendations for Outpatient Follow-up:  1. Follow up with PCP in 1-2 weeks 2. Avoid spicy food, late night eating and alcohol. 3. Recommend referral for GI eval if symptoms persist    Discharge Condition: Stable   CODE STATUS: FULL   Diet recommendation: Heart Healthy  Brief/Interim Summary: [name] a 56 year old male with medical history significant for normal ischemic cardiomyopathy. Presented to the ED complaining of epigastric abdominal pain. Patient was admitted CT shows enteritis versus early obstruction, NG tube was placed with minimal output therefore was removed. Patient was placed nothing by mouth and pain medications. Subsequently started on oral diet and tolerating. Patient passing gas and having bowel movements. Pain has resolved. Afebrile. Patient was started on pepcid and pantoprazole for 1 month.   Subjective: Patient seen and examined on the day of discharge, tolerating diet, denies abdominal pain, N/V and diarrhea. No acute events overnight   Discharge Diagnoses/Hospital Course:  Abdminal pain - clinically undetermined  Unclear etiology at this time CT of the abdomen concerning for early bowel obstruction versus enteritis Pain likely 2/2 gastritis  Repeat abdominal x-ray shows increase in gas pattern. Advised on weight loss  Patient was treated with IVF, Pepcid and PPI, with significant improvement will continue both for 1 month  No need for abx at this time. No WBC or fevers  Recommend referral for GI eval if symptoms persist   HTN  Initially soft - subsequently normalized  Continue home meds with no changes   Chronic systolic CHF  EF in 7/18 20-25% No signs of fluid overload  Continue home medications   All other chronic  medical condition were stable during the hospitalization.  On the day of the discharge the patient's vitals were stable, and no other acute medical condition were reported by patient. the patient was felt safe to be discharge to home  Discharge Instructions  You were cared for by a hospitalist during your hospital stay. If you have any questions about your discharge medications or the care you received while you were in the hospital after you are discharged, you can call the unit and asked to speak with the hospitalist on call if the hospitalist that took care of you is not available. Once you are discharged, your primary care physician will handle any further medical issues. Please note that NO REFILLS for any discharge medications will be authorized once you are discharged, as it is imperative that you return to your primary care physician (or establish a relationship with a primary care physician if you do not have one) for your aftercare needs so that they can reassess your need for medications and monitor your lab values.  Discharge Instructions    Call MD for:  difficulty breathing, headache or visual disturbances    Complete by:  As directed    Call MD for:  extreme fatigue    Complete by:  As directed    Call MD for:  hives    Complete by:  As directed    Call MD for:  persistant dizziness or light-headedness    Complete by:  As directed    Call MD for:  persistant nausea and vomiting    Complete by:  As directed    Call MD for:  redness, tenderness, or signs  of infection (pain, swelling, redness, odor or green/yellow discharge around incision site)    Complete by:  As directed    Call MD for:  severe uncontrolled pain    Complete by:  As directed    Call MD for:  temperature >100.4    Complete by:  As directed    Diet - low sodium heart healthy    Complete by:  As directed    Increase activity slowly    Complete by:  As directed      Allergies as of 06/28/2017   No Known  Allergies     Medication List    STOP taking these medications   naproxen sodium 220 MG tablet Commonly known as:  ANAPROX     TAKE these medications   acetaminophen 325 MG tablet Commonly known as:  TYLENOL Take 2 tablets (650 mg total) by mouth every 4 (four) hours as needed for headache or mild pain.   aspirin 81 MG chewable tablet Chew 1 tablet (81 mg total) by mouth daily.   atorvastatin 40 MG tablet Commonly known as:  LIPITOR Take 1 tablet (40 mg total) by mouth daily at 6 PM.   furosemide 40 MG tablet Commonly known as:  LASIX Take 2 tablets (80 mg total) by mouth 2 (two) times daily.   lisinopril 10 MG tablet Commonly known as:  PRINIVIL,ZESTRIL Take 1 tablet (10 mg total) by mouth daily.   methocarbamol 500 MG tablet Commonly known as:  ROBAXIN Take 1 tablet (500 mg total) by mouth 2 (two) times daily.   metoprolol succinate 50 MG 24 hr tablet Commonly known as:  TOPROL XL Take 1 tablet (50 mg total) by mouth daily.   MULTIVITAMIN ADULTS 50+ PO Take 2 tablets by mouth daily.   pantoprazole 20 MG tablet Commonly known as:  PROTONIX Take 1 tablet (20 mg total) by mouth daily.   potassium chloride 10 MEQ tablet Commonly known as:  K-DUR Take 2 tablets (20 mEq total) by mouth 2 (two) times daily.   ranitidine 150 MG tablet Commonly known as:  ZANTAC Take 1 tablet (150 mg total) by mouth 2 (two) times daily.            Discharge Care Instructions        Start     Ordered   06/28/17 0000  Increase activity slowly     06/28/17 1233   06/28/17 0000  Diet - low sodium heart healthy     06/28/17 1233   06/28/17 0000  Call MD for:  temperature >100.4     06/28/17 1233   06/28/17 0000  Call MD for:  persistant nausea and vomiting     06/28/17 1233   06/28/17 0000  Call MD for:  severe uncontrolled pain     06/28/17 1233   06/28/17 0000  Call MD for:  redness, tenderness, or signs of infection (pain, swelling, redness, odor or green/yellow  discharge around incision site)     06/28/17 1233   06/28/17 0000  Call MD for:  difficulty breathing, headache or visual disturbances     06/28/17 1233   06/28/17 0000  Call MD for:  hives     06/28/17 1233   06/28/17 0000  Call MD for:  persistant dizziness or light-headedness     06/28/17 1233   06/28/17 0000  Call MD for:  extreme fatigue     06/28/17 1233   06/25/17 0000  acetaminophen (TYLENOL) 325 MG tablet  Every  4 hours PRN     06/25/17 2358   06/25/17 0000  pantoprazole (PROTONIX) 20 MG tablet  Daily     06/25/17 2358   06/25/17 0000  ranitidine (ZANTAC) 150 MG tablet  2 times daily     06/25/17 2358     Follow-up Information    Schedule an appointment as soon as possible for a visit with [name], PA-C.   Specialty:  Family Medicine Contact information: [address] [place] [address] [place] [address] [phone]        [facility] EMERGENCY DEPARTMENT Follow up.   Specialty:  Emergency Medicine Why:  If symptoms worsen Contact information: [address] [id number] mc [place] [address] [phone]         No Known Allergies  Consultations:  None   Procedures/Studies: Dg Chest 2 View  Result Date: 06/25/2017 CLINICAL DATA:  Epigastric and chest pain. EXAM: CHEST  2 VIEW COMPARISON:  04/19/2017 . FINDINGS: Mediastinum and hilar structures are normal. Cardiomegaly with normal pulmonary vascularity. No focal infiltrate. No pleural effusion or pneumothorax. IMPRESSION: 1. Cardiomegaly.  No pulmonary venous congestion. 2. No acute pulmonary disease. Electronically Signed   By: Maisie Fus  Register   On: 06/25/2017 16:43   Dg Abd 2 Views  Result Date: 06/27/2017 CLINICAL DATA:  Abdominal pain for 2 days. EXAM: ABDOMEN - 2 VIEW COMPARISON:  None. FINDINGS: No evidence of dilated bowel loops on today's study. There is no evidence of free air. No radio-opaque calculi or other significant radiographic abnormality  is seen. Right pelvic phlebolith again noted. IMPRESSION: Unremarkable bowel gas pattern.  No acute findings. Electronically Signed   By: Myles Rosenthal M.D.   On: 06/27/2017 19:36   Dg Abd Portable 1v  Result Date: 06/27/2017 CLINICAL DATA:  Abdominal pain. EXAM: PORTABLE ABDOMEN - 1 VIEW COMPARISON:  CT 06/26/2017 . FINDINGS: Soft tissue structures are unremarkable. Slight distended air-filled loops of small bowel again noted loops small bowel again noted. Colonic gas pattern is unremarkable. No free air. O acute bony abnormality. Calcified pelvic density consistent phlebolith again noted . IMPRESSION: Slight distended air-filled loops of small bowel again noted. Similar findings noted on prior CT . Continued follow-up exams can be obtained to demonstrate resolution Electronically Signed   By: Maisie Fus  Register   On: 06/27/2017 08:40   Ct Renal Stone Study  Result Date: 06/26/2017 CLINICAL DATA:  Upper abdominal pain radiating to the chest. EXAM: CT ABDOMEN AND PELVIS WITHOUT CONTRAST TECHNIQUE: Multidetector CT imaging of the abdomen and pelvis was performed following the standard protocol without IV contrast. COMPARISON:  None. FINDINGS: Lower chest: The lung bases are clear. Hepatobiliary: No focal liver abnormality is seen. No gallstones, gallbladder wall thickening, or biliary dilatation. Pancreas: Unremarkable. No pancreatic ductal dilatation or surrounding inflammatory changes. Spleen: Normal in size without focal abnormality. Adrenals/Urinary Tract: No adrenal gland nodules. Kidneys are symmetrical in size. subcentimeter exophytic lesion from the right kidney likely represents a cyst. No hydronephrosis or hydroureter. No renal, ureteral, or bladder stones. No bladder wall thickening. Stomach/Bowel: Stomach is unremarkable. Mildly dilated fluid-filled mid abdominal small bowel with mild mesenteric edema. This could represent early obstruction or enteritis. Colon is decompressed and is diffusely  stool-filled. Appendix is normal. Vascular/Lymphatic: Scattered calcifications in the aorta. No aneurysm. No retroperitoneal lymphadenopathy. Reproductive: Prostate is unremarkable. Other: No free air in the abdomen. Abdominal wall musculature appears intact. Musculoskeletal: Degenerative changes in the lumbar spine with degenerative disc disease at L4-5 and  L5-S1. No destructive bone lesions. IMPRESSION: 1. No renal or ureteral stone or obstruction. 2. Mildly dilated fluid-filled mid abdominal small bowel with mesenteric edema. Changes may represent early obstruction or enteritis. Electronically Signed   By: Burman Nieves M.D.   On: 06/26/2017 00:23      Discharge Exam: Vitals:   06/27/17 2145 06/28/17 0922  BP: 114/67   Pulse: (!) 50 64  Resp: 15   Temp: 98.1 F (36.7 C)   SpO2: 97%    Vitals:   06/27/17 0830 06/27/17 1338 06/27/17 2145 06/28/17 0922  BP:  108/64 114/67   Pulse: 68 (!) 59 (!) 50 64  Resp:  19 15   Temp:  98.6 F (37 C) 98.1 F (36.7 C)   TempSrc:  Oral Oral   SpO2:  99% 97%   Weight:      Height:        General: Pt is alert, awake, not in acute distress Cardiovascular: RRR, S1/S2 +, no rubs, no gallops Respiratory: CTA bilaterally, no wheezing, no rhonchi Abdominal: Soft, NT, ND, bowel sounds + Extremities: no edema, no cyanosis    The results of significant diagnostics from this hospitalization (including imaging, microbiology, ancillary and laboratory) are listed below for reference.     Microbiology: No results found for this or any previous visit (from the past 240 hour(s)).   Labs: BNP (last 3 results)  Recent Labs  04/19/17 1615 06/25/17 2204  BNP 654.7* 88.2   Basic Metabolic Panel:  Recent Labs Lab 06/25/17 1617 06/26/17 1044 06/27/17 0406  NA 138 139 138  K 3.9 4.1 3.9  CL 101 105 107  CO2 24 21* 26  GLUCOSE 112* 125* 83  BUN CREATININE 1.13 0.98 1.05  CALCIUM 9.8 9.2 8.9   Liver Function Tests:  Recent  Labs Lab 06/25/17 2204  AST 29  ALT 28  ALKPHOS 80  BILITOT 1.2  PROT 7.6  ALBUMIN 4.5    Recent Labs Lab 06/25/17 2204  LIPASE 38   No results for input(s): AMMONIA in the last 168 hours. CBC:  Recent Labs Lab 06/25/17 1617 06/27/17 0406  WBC 8.5 6.8  HGB 14.8 13.0  HCT 43.0 39.1  MCV 87.6 89.1  PLT 195 161   Cardiac Enzymes:  Recent Labs Lab 06/25/17 2204  TROPONINI <0.03   BNP: Invalid input(s): POCBNP CBG: No results for input(s): GLUCAP in the last 168 hours. D-Dimer No results for input(s): DDIMER in the last 72 hours. Hgb A1c No results for input(s): HGBA1C in the last 72 hours. Lipid Profile No results for input(s): CHOL, HDL, LDLCALC, TRIG, CHOLHDL, LDLDIRECT in the last 72 hours. Thyroid function studies No results for input(s): TSH, T4TOTAL, T3FREE, THYROIDAB in the last 72 hours.  Invalid input(s): FREET3 Anemia work up No results for input(s): VITAMINB12, FOLATE, FERRITIN, TIBC, IRON, RETICCTPCT in the last 72 hours. Urinalysis    Component Value Date/Time   COLORURINE STRAW (A) 04/20/2017 0206   APPEARANCEUR CLEAR 04/20/2017 0206   LABSPEC 1.005 04/20/2017 0206   PHURINE 7.0 04/20/2017 0206   GLUCOSEU NEGATIVE 04/20/2017 0206   HGBUR NEGATIVE 04/20/2017 0206   BILIRUBINUR NEGATIVE 04/20/2017 0206   KETONESUR NEGATIVE 04/20/2017 0206   PROTEINUR NEGATIVE 04/20/2017 0206   NITRITE NEGATIVE 04/20/2017 0206   LEUKOCYTESUR NEGATIVE 04/20/2017 0206   Sepsis Labs Invalid input(s): PROCALCITONIN,  WBC,  LACTICIDVEN Microbiology No results found for this or any previous visit (from the past 240 hour(s)).   Time  coordinating discharge:  30 minutes  SIGNED:  Latrelle Dodrill, MD  [facility] 06/28/2017, 12:33 PM  Pager please text page via  [email or web address] Password [email or web address]

## 2017-06-30 ENCOUNTER — Ambulatory Visit (HOSPITAL_BASED_OUTPATIENT_CLINIC_OR_DEPARTMENT_OTHER): Payer: PRIVATE HEALTH INSURANCE | Attending: Physician Assistant | Admitting: Cardiology

## 2017-06-30 VITALS — Ht 70.0 in | Wt 264.0 lb

## 2017-06-30 DIAGNOSIS — R0683 Snoring: Secondary | ICD-10-CM

## 2017-06-30 DIAGNOSIS — G4733 Obstructive sleep apnea (adult) (pediatric): Secondary | ICD-10-CM | POA: Insufficient documentation

## 2017-07-03 ENCOUNTER — Ambulatory Visit (INDEPENDENT_AMBULATORY_CARE_PROVIDER_SITE_OTHER): Payer: PRIVATE HEALTH INSURANCE | Admitting: Pharmacist

## 2017-07-03 VITALS — BP 112/60 | HR 55 | Wt 264.0 lb

## 2017-07-03 DIAGNOSIS — I5042 Chronic combined systolic (congestive) and diastolic (congestive) heart failure: Secondary | ICD-10-CM | POA: Diagnosis not present

## 2017-07-03 NOTE — Progress Notes (Signed)
Patient ID: [name]                 DOB: 28-May-1961                      MRN: [id number]    HPI: [name] is a 56 y.o. male patient of Dr. Katrinka Blazing referred to HTN clinic. PMH is significant for chronic combined systolic and diastolic HF, HTN, OSA, NICM, NSVT. He was recently diagnosed with nonischemic cardiomyopathy, combined systolic and diastolic heart failure. He was admitted 04/2017 with acute CHF with unclear etiology. 2D Echo 04/21/17 showed EF 20-25%. He was placed on Lopressor, with the intention to later transition to Toprol XL as well as ACE inhibitor therapy with lisinopril. It was felt that his BP would not tolerate addition of Entresto at time of hospital d/c. He was also placed on Lasix for fluid management.  He was seen on 06/12/2017 for follow-up with D. Dunn, PA-C. His weight was noted to be up to 278 pounds. His discharge weight from the hospital in July was 250 pounds. He admitted to dietary indiscretion with sodium. Lasix dose was increased to 40 mg twice a day and he was started on supplemental potassium, 10 mEq twice a day. On his follow-up 9/7 OV, lasix was increased to 80 mg BID. His blood pressure has tolerated the increased dose of Lasix at 108/74 mmHg. Lisinopril was continued, lopressor was transitioned to Toprol XL. May consider Entresto if BP stable. --- Patient was hospitalized last week (Last Tuesday 9/18-Friday 9/21) for GI obstruction. At discharge he was initiated on a 30 day supply of Protonix 20 mg daily and zantac 150 mg twice daily. He was told to not eat any leafy vegetables or spicy foods as this can exacerbate this in the future. He reports no issues since discharge.   Today, he presents with no acute complaints. He denies dizziness, recent falls, or lightheadedness. He reports compliance with his current HF meds and his LEE is much improved. His weight is also trending back down to his baseline of ~250 lbs at 265 lbs. He has been making huge strides  in reducing sodium in his diet and has become aware of how to read the sodium content on food labels. He presents with many questions on how the medications work, when his heart function will improve, and when he can potentially come off of some of these medications. Time was spent answering his questions and ensuring that mortality/cardiac benefit of his current HF medications. He has a BP cuff at home but states that he does not check at home. 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   Current HTN meds: furosemide 80 mg BID, lisinopril 10 mg daily, Toprol XL 50 mg daily   Previously tried: lopressor 25 mg twice daily (switched to Toprol XL for HF optimization)  BP goal: <130/80 mmHg   Family History: Not significant.   Social History: Never smoker, never used smokeless tobacco, married. 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   Diet: Does not add salt to his foods. Looking at labels on foods and not choosing items that have a high sodium content. Tends to eat a lot of non-leafy vegetables. Eats fruit and [place] burgers.  Told in the hospital to avoid lettuce and spicy foods to avoid GI obstruction.   Exercise:  Put in an application for a new gym membership. Going to try to go swimming and start going to the gym more often.   Home BP readings: Has a cuff at home but does not take pressures.   Wt Readings from Last 3 Encounters:  06/30/17 264 lb (119.7 kg)  06/27/17 264 lb 12.8 oz (120.1 kg)  06/14/17 272 lb 1.9 oz (123.4 kg)   BP Readings from Last 3 Encounters:  06/28/17 114/67  06/14/17 108/74  06/12/17 122/74   Pulse Readings from Last 3 Encounters:  06/28/17 60  06/14/17 62  06/12/17 84    Renal function: Estimated Creatinine Clearance: 103.1 mL/min (by C-G formula based on SCr of 1.05 mg/dL).  Past Medical History:  Diagnosis Date  . Arthritis    "knees"  (06/28/2017)  . Chronic combined systolic and diastolic heart failure (HCC) 04/19/2017   a. LHC 04/22/17 - Normal coronary arteries; LVEDP 33 // Echo 04/21/17 - Mild LVH, EF 20-25, diff HK, restrictive physiology, mod to severe LAE, mild RVE, mild RAE, PASP 35.  [name] History of gout   . Migraine    "I had 1 in the 1990s" (06/28/2017)  . NICM (nonischemic cardiomyopathy) (HCC) 04/23/2017  . Obesity   . Sleep apnea 04/23/2017    Current Outpatient Prescriptions on File Prior to Visit  Medication Sig Dispense Refill  . acetaminophen (TYLENOL) 325 MG tablet Take 2 tablets (650 mg total) by mouth every 4 (four) hours as needed for headache or mild pain. 30 tablet 0  . aspirin 81 MG chewable tablet Chew 1 tablet (81 mg total) by mouth daily.    [name] atorvastatin (LIPITOR) 40 MG tablet Take 1 tablet (40 mg total) by mouth daily at 6 PM. 30 tablet 6  . furosemide (LASIX) 40 MG tablet Take 2 tablets (80 mg total) by mouth 2 (two) times daily. 120 tablet 3  . lisinopril (PRINIVIL,ZESTRIL) 10 MG tablet Take 1 tablet (10 mg total) by mouth daily. 90 tablet 3  . methocarbamol (ROBAXIN) 500 MG tablet Take 1 tablet (500 mg total) by mouth 2 (two) times daily. 20 tablet 0  . metoprolol succinate (TOPROL-XL) 50 MG 24 hr tablet Take 1 tablet (50 mg total) by mouth daily. 30 tablet 4  . Multiple Vitamins-Minerals (MULTIVITAMIN ADULTS 50+ PO) Take 2 tablets by mouth daily.    . pantoprazole (PROTONIX) 20 MG tablet Take 1 tablet (20 mg total) by mouth daily. 30 tablet 0  . potassium chloride (K-DUR) 10 MEQ tablet Take 2 tablets (20 mEq total) by mouth 2 (two) times daily. 120 tablet 3  . ranitidine (ZANTAC) 150 MG tablet Take 1 tablet (150 mg total) by mouth 2 (two) times daily. 60 tablet 0   No current facility-administered medications on file prior to visit.     No Known Allergies   Assessment/Plan:  1. Hypertension/HF optimization: Patient's blood pressure is at goal of <130/80 mmHg at 112/60 mmHg. He is  currently on appropriate ACEi and beta blocker therapy (HR 55) but HF regimen could be further optimized by adding spironolactone. Do not feel that his blood pressure could tolerate the transition to Entresto at this time. Will initiate spironolactone 12.5 mg daily and continue furosemide 80 mg twice daily, lisinopril 10 mg daily, Toprol XL 50 mg daily. He was advised to start taking his blood pressures at home, monitor his weight, and to continue to watch his sodium intake in his diet.  Follow-up with Wende Mott, PA-C on Oct.5 as scheduled. Will  check BMET at that time with addition of spironolactone. F/u in pharmacy clinic as needed.   Jonnell Hentges L. Marcy Salvo, PharmD, MS PGY1 Pharmacy Resident [facility]

## 2017-07-03 NOTE — Patient Instructions (Addendum)
It was nice to meet you today!  Your blood pressure was 112/60 mmHg today in clinic.  To optimize your heart failure medication regimen, we will ADD spironolactone 12.5 mg daily. Take this medication with your morning medications.   Take your blood pressure at home a few times a week and take your blood pressure if you are feeling dizzy or lightheaded. Keep watching your salt in your diet and I encourage your recent decision to join a gym!   Follow-up in clinic on October 5th with Wende Mott, PA-C with labs. We are particularly looking at your potassium level and kidney function.   Call us if you have any concerns. [phone].

## 2017-07-11 NOTE — Progress Notes (Signed)
Cardiology Office Note:    Date:  07/12/2017   ID:  GOLDEN [name], DOB [birth year], MRN [id number]  PCP:  [name], PA-C  Cardiologist:  [name]    Referring MD: [name], PA-C   Chief Complaint  Patient presents with  . Congestive Heart Failure    Follow-up    History of Present Illness:    [name] is a 56 y.o. male with a hx of combined systolic and diastolic HF, NICM with EF 20-25, Cardiac Catheterization with normal coronary arteries 7/18.  Last seen in clinic by Robert Lis, PA-C for follow up on congestive heart failure.  He had recently been diuresed for volume excess.  He was then admitted 9/18-9/21 with abdominal pain 2/2 early small bowel obstruction vs enteritis.  BNP was 88.2 (normal).  He was seen by M. Supple, PharmD 07/03/17 and placed on Spironolactone 12.5 mg Once daily to advance his congestive heart failure regimen.    [name] returns for follow-up. He is here alone. He has been doing well. He has not had significant short of breath. He has not had chest pain. He denies syncope, orthopnea, PND or edema. He has not yet started spironolactone.  Prior CV studies:   The following studies were reviewed today:  LHC 04/22/17 Normal coronary arteries; LVEDP 33  Echo 04/21/17 Mild LVH, EF 20-25, diff HK, restrictive physiology, mod to severe LAE, mild RVE, mild RAE, PASP 35  Past Medical History:  Diagnosis Date  . Arthritis    "knees" (06/28/2017)  . Chronic combined systolic and diastolic heart failure (HCC) 04/19/2017   a. LHC 04/22/17 - Normal coronary arteries; LVEDP 33 // Echo 04/21/17 - Mild LVH, EF 20-25, diff HK, restrictive physiology, mod to severe LAE, mild RVE, mild RAE, PASP 35.  [name] History of gout   . Migraine    "I had 1 in the 1990s" (06/28/2017)  . NICM (nonischemic cardiomyopathy) (HCC) 04/23/2017  . Obesity   . Sleep apnea 04/23/2017    Past Surgical History:  Procedure Laterality Date  . CARDIAC  CATHETERIZATION  04/2017   patent coronary arteries/medical hx  . KNEE ARTHROSCOPY Left ~ 2008  . LEFT HEART CATH AND CORONARY ANGIOGRAPHY [name] 04/22/2017   Procedure: Left Heart Cath and Coronary Angiography;  Surgeon: Lyn Records, MD;  Location[facility] INVASIVE CV LAB;  Service: Cardiovascular;  Laterality: [name];    Current Medications: Current Meds  Medication Sig  . acetaminophen (TYLENOL) 325 MG tablet Take 2 tablets (650 mg total) by mouth every 4 (four) hours as needed for headache or mild pain.  [name] aspirin 81 MG chewable tablet Chew 1 tablet (81 mg total) by mouth daily.  [name] atorvastatin (LIPITOR) 40 MG tablet Take 1 tablet (40 mg total) by mouth daily at 6 PM.  . furosemide (LASIX) 40 MG tablet Take 2 tablets (80 mg total) by mouth 2 (two) times daily.  [name] lisinopril (PRINIVIL,ZESTRIL) 10 MG tablet Take 1 tablet (10 mg total) by mouth daily.  . methocarbamol (ROBAXIN) 500 MG tablet Take 1 tablet (500 mg total) by mouth 2 (two) times daily.  . metoprolol succinate (TOPROL-XL) 50 MG 24 hr tablet Take 1 tablet (50 mg total) by mouth daily.  . Multiple Vitamins-Minerals (MULTIVITAMIN ADULTS 50+ PO) Take 2 tablets by mouth daily.  . pantoprazole (PROTONIX) 20 MG tablet Take 1 tablet (20 mg total) by mouth daily.  . potassium chloride (K-DUR) 10 MEQ tablet Take 2 tablets (20 mEq  total) by mouth 2 (two) times daily.  . ranitidine (ZANTAC) 150 MG tablet Take 1 tablet (150 mg total) by mouth 2 (two) times daily.     Allergies:   Patient has no known allergies.   Social History   Social History  . Marital status: Married    Spouse name: [name]  . Number of children: [name]  . Years of education: [name]   Social History Main Topics  . Smoking status: Never Smoker  . Smokeless tobacco: Never Used  . Alcohol use No  . Drug use: No  . Sexual activity: Not Currently   Other Topics Concern  . None   Social History Narrative  . None     Family Hx: The patient's Family history is unknown by  patient.  ROS:   Please see the history of present illness.    ROS All other systems reviewed and are negative.   EKGs/Labs/Other Test Reviewed:    EKG:  EKG is ordered today.  The ekg ordered today demonstrates Sinus rhythm, HR 60, left axis deviation, QTc 430 ms, findings similar to prior tracings  Recent Labs: 04/19/2017: Magnesium 2.2 04/20/2017: TSH 1.974 06/25/2017: ALT 28; B Natriuretic Peptide 88.2 06/27/2017: BUN 12; Creatinine, Ser 1.05; Hemoglobin 13.0; Platelets 161; Potassium 3.9; Sodium 138   Recent Lipid Panel Lab Results  Component Value Date/Time   CHOL 156 06/11/2017 11:01 AM   TRIG 77 06/11/2017 11:01 AM   HDL 47 06/11/2017 11:01 AM   CHOLHDL 3.3 06/11/2017 11:01 AM   CHOLHDL 3.9 04/20/2017 02:24 AM   LDLCALC 94 06/11/2017 11:01 AM    Physical Exam:    VS:  BP 108/70   Pulse 60   Ht  (1.778 m)   Wt 264 lb 12.8 oz (120.1 kg)   BMI 37.99 kg/m     Wt Readings from Last 3 Encounters:  07/12/17 264 lb 12.8 oz (120.1 kg)  07/03/17 264 lb (119.7 kg)  06/30/17 264 lb (119.7 kg)     Physical Exam  Constitutional: He is oriented to person, place, and time. He appears well-developed and well-nourished.  HENT:  Head: Normocephalic and atraumatic.  Neck: No JVD present.  Cardiovascular: Normal rate and regular rhythm.   No murmur heard. Pulmonary/Chest: Effort normal. He has no rales.  Abdominal: Soft.  Musculoskeletal: He exhibits no edema.  Neurological: He is alert and oriented to person, place, and time.  Skin: Skin is warm and dry.  Psychiatric: He has a normal mood and affect.    ASSESSMENT:    1. Chronic combined systolic and diastolic heart failure (HCC)   2. NICM (nonischemic cardiomyopathy) (HCC)    PLAN:    In order of problems listed above:  1. Chronic combined systolic and diastolic heart failure (HCC) New York Heart Association class II. His volume is normal. He has not yet started spironolactone. He plans to pick it up today.  We will reschedule follow-up BMET for next week. Continue current dose of lisinopril, beta blocker.  2. NICM (nonischemic cardiomyopathy) (HCC) His blood pressure will not tolerate further adjustments in his medications. Therefore, a follow-up echo will be arranged in about one month. If his EF remains <35%, refer to EP for consideration of ICD.   Dispo:  Return in about 4 weeks (around 08/09/2017) for Routine Follow Up with Dr. Katrinka Blazing.   Medication Adjustments/Labs and Tests Ordered: Current medicines are reviewed at length with the patient today.  Concerns regarding medicines are outlined above.  Tests Ordered:  Orders Placed This Encounter  Procedures  . EKG 12-Lead  . ECHOCARDIOGRAM COMPLETE   Medication Changes: No orders of the defined types were placed in this encounter.   Signed, Tereso Newcomer, PA-C  07/12/2017 2:02 PM    [facility] [address] [place], [place], [place]  [address] Phone: [phone]; Fax: [phone]

## 2017-07-12 ENCOUNTER — Other Ambulatory Visit: Payer: PRIVATE HEALTH INSURANCE

## 2017-07-12 ENCOUNTER — Telehealth: Payer: Self-pay | Admitting: *Deleted

## 2017-07-12 ENCOUNTER — Other Ambulatory Visit: Payer: Self-pay | Admitting: *Deleted

## 2017-07-12 ENCOUNTER — Encounter: Payer: Self-pay | Admitting: Physician Assistant

## 2017-07-12 ENCOUNTER — Ambulatory Visit (INDEPENDENT_AMBULATORY_CARE_PROVIDER_SITE_OTHER): Payer: PRIVATE HEALTH INSURANCE | Admitting: Physician Assistant

## 2017-07-12 ENCOUNTER — Encounter (INDEPENDENT_AMBULATORY_CARE_PROVIDER_SITE_OTHER): Payer: Self-pay

## 2017-07-12 VITALS — BP 108/70 | HR 60 | Ht 70.0 in | Wt 264.8 lb

## 2017-07-12 DIAGNOSIS — I5042 Chronic combined systolic (congestive) and diastolic (congestive) heart failure: Secondary | ICD-10-CM

## 2017-07-12 DIAGNOSIS — I428 Other cardiomyopathies: Secondary | ICD-10-CM | POA: Diagnosis not present

## 2017-07-12 MED ORDER — SPIRONOLACTONE 25 MG PO TABS: 12.5000 mg | ORAL_TABLET | Freq: Every day | ORAL | 11 refills | Status: DC

## 2017-07-12 NOTE — Patient Instructions (Signed)
Medication Instructions:  The current medical regimen is effective;  continue present plan and medications.  Labwork: Please reschedule your blood work to next week (BMP)  Testing/Procedures: Your physician has requested that you have an echocardiogram in approximately 1 month. Echocardiography is a painless test that uses sound waves to create images of your heart. It provides your doctor with information about the size and shape of your heart and how well your heart's chambers and valves are working. This procedure takes approximately one hour. There are no restrictions for this procedure.  Follow-Up: Follow up with Dr Katrinka Blazing in approximately 1 month. (after you have had your 2 D Echo)  If you need a refill on your cardiac medications before your next appointment, please call your pharmacy.  Thank you for choosing [facility]!!

## 2017-07-12 NOTE — Telephone Encounter (Signed)
Pt calling because the person he saw last week was supposed to call him in a medication and it hasn't been done yer.  He is asking this be completed.  I believe the medication he is speaking of is spironolactone 12.5 mg a day.  Will forward this to be verified by Margaretmary Dys as this is in her recent dictation.  Pt aware RX will be sent into the pharmacy once verified.  1. Hypertension/HF optimization: Patient's blood pressure is at goal of <130/80 mmHg at 112/60 mmHg. He is currently on appropriate ACEi and beta blocker therapy (HR 55) but HF regimen could be further optimized by adding spironolactone. Do not feel that his blood pressure could tolerate the transition to Entresto at this time. Will initiate spironolactone 12.5 mg daily and continue furosemide 80 mg twice daily, lisinopril 10 mg daily, Toprol XL 50 mg daily. He was advised to start taking his blood pressures at home, monitor his weight, and to continue to watch his sodium intake in his diet.

## 2017-07-12 NOTE — Telephone Encounter (Signed)
Pt seen by resident Dellie Catholic on 9/26. Do not see that spironolactone has been sent in, will do this now. Pt's wife is aware. Pt will call clinic on Monday to reschedule lab work and f/u appt.

## 2017-07-15 NOTE — Telephone Encounter (Signed)
Pt returned call. BMET moved to 10/15 due to recent spironolactone start. Pt would not like to schedule f/u in HTN clinic at this time because he is seeing his PCP on Friday and will have his BP checked there. Advised pt to have his BP checked at the pharmacy as well and to call clinic with any signs of low BP or dizziness. He verbalized understanding.

## 2017-07-15 NOTE — Telephone Encounter (Signed)
Unable to reach patient via telephone. Will call back tomorrow.

## 2017-07-17 ENCOUNTER — Telehealth: Payer: Self-pay | Admitting: Interventional Cardiology

## 2017-07-17 ENCOUNTER — Other Ambulatory Visit (HOSPITAL_COMMUNITY): Payer: PRIVATE HEALTH INSURANCE

## 2017-07-17 NOTE — Telephone Encounter (Signed)
Pt states that his Short Term Disability people told him that they needed a letter stating the start date for him being out of work and a return to work date.  Pt has not returned to work as of yet.  Pt has appt with MD's at [facility] on 08/02/17.  Pt has limited echo scheduled 11/5 and appt with Dr. Katrinka Blazing on 12/6.  Advised pt I would send message to Dr. Katrinka Blazing to see if ok to write letter for pt's short term disability.  Pt provided name and fax number for short term disability coordinator.    [employer] ATTN: Darcell Hardgraves Fax # [phone]

## 2017-07-17 NOTE — Telephone Encounter (Signed)
Find out when return to work anticipated based on how he feels and what he has to do at work.

## 2017-07-17 NOTE — Telephone Encounter (Signed)
New message    For his std:   Needs a letter from the time he was first off until he is to return to work, or they are going to cancel his disability payments

## 2017-07-18 NOTE — Telephone Encounter (Signed)
Attempted home number and message states that call can not be completed as dialed.  Called pt's cell number and left message.

## 2017-07-18 NOTE — Telephone Encounter (Signed)
If there are forms that need to be filled out, he should provide those. I'll probably need to see him before he returns to work.

## 2017-07-18 NOTE — Telephone Encounter (Signed)
Left message to call back  

## 2017-07-18 NOTE — Telephone Encounter (Signed)
Follow Up:; ° ° °Returning your call. °

## 2017-07-18 NOTE — Telephone Encounter (Signed)
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  Does a lot of lifting, up to 40lbs at a time.  Pt hoping to return to work on 08/01/17 if Dr. Katrinka Blazing feels this is safe.  Will send information to Dr. Katrinka Blazing to review.

## 2017-07-19 NOTE — Telephone Encounter (Signed)
Attempted to call pt again, received same error message as previous call.

## 2017-07-20 NOTE — Procedures (Signed)
Patient Name: Robert, Snow Date: 06/30/2017 Gender: Male D.O.B: Apr 06, 1961 Age (years): 56 Referring Provider: Richardson Dopp Height (inches): 30 Interpreting Physician: Fransico Him MD, ABSM Weight (lbs): 264 RPSGT: Zadie Rhine BMI: 38 MRN: [id number] Neck Size: 18.50  CLINICAL INFORMATION Sleep Study Type: Split Night CPAP  Indication for sleep study: Snoring  Epworth Sleepiness Score: 13  SLEEP STUDY TECHNIQUE As per the AASM Manual for the Scoring of Sleep and Associated Events v2.3 (April 2016) with a hypopnea requiring 4% desaturations.  The channels recorded and monitored were frontal, central and occipital EEG, electrooculogram (EOG), submentalis EMG (chin), nasal and oral airflow, thoracic and abdominal wall motion, anterior tibialis EMG, snore microphone, electrocardiogram, and pulse oximetry. Continuous positive airway pressure (CPAP) was initiated when the patient met split night criteria and was titrated according to treat sleep-disordered breathing.  MEDICATIONS Medications self-administered by patient taken the night of the study : [name]  RESPIRATORY PARAMETERS Diagnostic Total AHI (/hr): 22.5  RDI (/hr):25.8  OA Index (/hr): 8  CA Index (/hr): 0.0 REM AHI (/hr): 16.4  NREM AHI (/hr):23.8  Supine AHI (/hr):22.5  Non-supine AHI (/hr):[name] Min O2 Sat (%):86.00  Mean O2 (%): 94.04  Time below 88% (min):2.8    Titration Optimal Pressure (cm):12  AHI at Optimal Pressure (/hr):2.3  Min O2 at Optimal Pressure (%):94.0 Supine % at Optimal (%):100  Sleep % at Optimal (%):99    SLEEP ARCHITECTURE The recording time for the entire night was 368.8 minutes.  During a baseline period of 149.6 minutes, the patient slept for 128.0 minutes in REM and nonREM, yielding a sleep efficiency of 85.6%. Sleep onset after lights out was 8.0 minutes with a REM latency of 52.0 minutes. The patient spent 5.08% of the night in stage N1 sleep, 77.73% in stage N2  sleep, 0.00% in stage N3 and 17.19% in REM.  During the titration period of 214.3 minutes, the patient slept for 208.0 minutes in REM and nonREM, yielding a sleep efficiency of 97.1%. Sleep onset after CPAP initiation was 3.4 minutes with a REM latency of 65.5 minutes. The patient spent 2.88% of the night in stage N1 sleep, 66.83% in stage N2 sleep, 0.00% in stage N3 and 30.29% in REM.  CARDIAC DATA The 2 lead EKG demonstrated sinus rhythm. The mean heart rate was 52.09 beats per minute. Other EKG findings include: PVCs.  LEG MOVEMENT DATA The total Periodic Limb Movements of Sleep (PLMS) were 0. The PLMS index was 0.00 .  IMPRESSIONS - Moderate obstructive sleep apnea occurred during the diagnostic portion of the study(AHI = 22.5/hour). An optimal PAP pressure was selected for this patient ( 12 cm of water) - No significant central sleep apnea occurred during the diagnostic portion of the study (CAI = 0.0/hour). - The patient had minimal or no oxygen desaturation during the diagnostic portion of the study (Min O2 = 86.00%) - No snoring was audible during the diagnostic portion of the study. - EKG findings include PVCs. - Clinically significant periodic limb movements did not occur during sleep.  DIAGNOSIS - Obstructive Sleep Apnea (327.23 [G47.33 ICD-10])  RECOMMENDATIONS - Trial of CPAP therapy on 12 cm H2O with a Medium size Resmed Full Face Mask AirFit F20 mask and heated humidification. - Avoid alcohol, sedatives and other CNS depressants that may worsen sleep apnea and disrupt normal sleep architecture. - Sleep hygiene should be reviewed to assess factors that may improve sleep quality. - Weight management and regular exercise should be initiated or continued. -  Return to Sleep Center for re-evaluation after 10 weeks of therapy  [place], [place] [facility]  ELECTRONICALLY SIGNED ON:  07/20/2017, 8:47 PM [place] PH: [phone]  [phone]   FX: [phone] [place]

## 2017-07-22 ENCOUNTER — Encounter: Payer: Self-pay | Admitting: *Deleted

## 2017-07-22 ENCOUNTER — Other Ambulatory Visit: Payer: PRIVATE HEALTH INSURANCE

## 2017-07-22 DIAGNOSIS — I5042 Chronic combined systolic (congestive) and diastolic (congestive) heart failure: Secondary | ICD-10-CM

## 2017-07-22 NOTE — Telephone Encounter (Signed)
Spoke with person that answered the phone and he asked that I call pt back as he is unavailable at this time.

## 2017-07-22 NOTE — Telephone Encounter (Signed)
[name] is returning your call. He is wanting to know if you were able to fax a return to work note to his Job . States it needs to be there by today or else they will not pay him for this upcoming week . Please call

## 2017-07-22 NOTE — Telephone Encounter (Signed)
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  Spoke with pt and he states that he feels like he can complete his job duties as long as he takes his time.  Pt did start Spironolactone and has had no issues and states medication is working.  Advised I would speak with Dr. Katrinka Blazing and then call back.  Spoke with Dr. Katrinka Blazing and he said ok for pt to return to work.  Letter completed and faxed as requested.  Spoke with pt and made him aware. Pt appreciative for assistance.

## 2017-07-23 LAB — BASIC METABOLIC PANEL
BUN/Creatinine Ratio: 15 (ref 9–20)
BUN: 23 mg/dL (ref 6–24)
CALCIUM: 9.8 mg/dL (ref 8.7–10.2)
CO2: 25 mmol/L (ref 20–29)
Chloride: 103 mmol/L (ref 96–106)
Creatinine, Ser: 1.49 mg/dL — ABNORMAL HIGH (ref 0.76–1.27)
GFR, EST AFRICAN AMERICAN: 60 mL/min/{1.73_m2} (ref >59)
GFR, EST NON AFRICAN AMERICAN: 52 mL/min/{1.73_m2} — AB (ref >59)
Glucose: 93 mg/dL (ref 65–99)
Potassium: 4.1 mmol/L (ref 3.5–5.2)
SODIUM: 143 mmol/L (ref 134–144)

## 2017-07-25 ENCOUNTER — Other Ambulatory Visit: Payer: Self-pay

## 2017-07-25 DIAGNOSIS — I10 Essential (primary) hypertension: Secondary | ICD-10-CM

## 2017-07-25 DIAGNOSIS — Z79899 Other long term (current) drug therapy: Secondary | ICD-10-CM

## 2017-07-26 ENCOUNTER — Telehealth: Payer: Self-pay | Admitting: *Deleted

## 2017-07-26 NOTE — Telephone Encounter (Signed)
-----   Message from Quintella Reichert, MD sent at 07/20/2017  9:02 PM EDT ----- Please let patient know that they have significant sleep apnea and had successful CPAP titration and will be set up with CPAP unit.  Please let DME know that order is in EPIC.  Please set patient up for OV in 10 weeks

## 2017-07-26 NOTE — Telephone Encounter (Signed)
Informed patient of titration results and verbalized understanding was indicated. Patient understands he has significant sleep apnea and had a successful CPAP titration and will be set up with a CPAP unit. Patient understands he will be contacted by [name] to set up his cpap. He understands to call if [facility] does not contact her with new setup in a timely manner. He understands he will be called once confirmation has been received from [facility] that he has received his new machine to schedule 10 week follow up appointment.  [facility] notified of new cpap order  Please add to [name] He was grateful for the call and thanked me

## 2017-08-08 ENCOUNTER — Other Ambulatory Visit: Payer: PRIVATE HEALTH INSURANCE | Admitting: *Deleted

## 2017-08-08 DIAGNOSIS — Z79899 Other long term (current) drug therapy: Secondary | ICD-10-CM

## 2017-08-08 DIAGNOSIS — I10 Essential (primary) hypertension: Secondary | ICD-10-CM

## 2017-08-08 LAB — BASIC METABOLIC PANEL
BUN / CREAT RATIO: 10 (ref 9–20)
BUN: 10 mg/dL (ref 6–24)
CO2: 24 mmol/L (ref 20–29)
CREATININE: 1.04 mg/dL (ref 0.76–1.27)
Calcium: 9.5 mg/dL (ref 8.7–10.2)
Chloride: 101 mmol/L (ref 96–106)
GFR calc Af Amer: 93 mL/min/{1.73_m2} (ref >59)
GFR, EST NON AFRICAN AMERICAN: 80 mL/min/{1.73_m2} (ref >59)
Glucose: 89 mg/dL (ref 65–99)
Potassium: 4.3 mmol/L (ref 3.5–5.2)
SODIUM: 140 mmol/L (ref 134–144)

## 2017-08-12 ENCOUNTER — Other Ambulatory Visit: Payer: Self-pay

## 2017-08-12 ENCOUNTER — Ambulatory Visit (HOSPITAL_COMMUNITY): Payer: PRIVATE HEALTH INSURANCE | Attending: Cardiology

## 2017-08-12 DIAGNOSIS — G4733 Obstructive sleep apnea (adult) (pediatric): Secondary | ICD-10-CM | POA: Insufficient documentation

## 2017-08-12 DIAGNOSIS — Z6838 Body mass index (BMI) 38.0-38.9, adult: Secondary | ICD-10-CM | POA: Insufficient documentation

## 2017-08-12 DIAGNOSIS — I110 Hypertensive heart disease with heart failure: Secondary | ICD-10-CM | POA: Insufficient documentation

## 2017-08-12 DIAGNOSIS — I5042 Chronic combined systolic (congestive) and diastolic (congestive) heart failure: Secondary | ICD-10-CM | POA: Diagnosis present

## 2017-08-12 DIAGNOSIS — E669 Obesity, unspecified: Secondary | ICD-10-CM | POA: Insufficient documentation

## 2017-08-12 DIAGNOSIS — I429 Cardiomyopathy, unspecified: Secondary | ICD-10-CM | POA: Diagnosis not present

## 2017-08-13 ENCOUNTER — Encounter: Payer: Self-pay | Admitting: Physician Assistant

## 2017-09-04 ENCOUNTER — Encounter: Payer: Self-pay | Admitting: Interventional Cardiology

## 2017-09-11 NOTE — Progress Notes (Deleted)
Cardiology Office Note    Date:  09/11/2017   ID:  Clemetine MarkerJohnny R Snow, DOB 07/28/1961, MRN [id number]  PCP:  Etta QuillBryan, Daniel J, PA-C  Cardiologist: Lesleigh NoeHenry W Ludger Bones III, MD   No chief complaint on file.   History of Present Illness:  Robert CrockerJohnny R Snow is a 56 y.o. male with a hx of combined systolic and diastolic HF, NICM with EF 20-25, Cardiac Catheterization with normal coronary arteries 7/18.        Past Medical History:  Diagnosis Date  . Arthritis    "knees" (06/28/2017)  . Chronic combined systolic and diastolic heart failure (HCC) 04/19/2017   a. LHC 04/22/17 - Normal coronary arteries; LVEDP 33 // Echo 04/21/17 - Mild LVH, EF 20-25, diff HK, restrictive physiology, mod to severe LAE, mild RVE, mild RAE, PASP 35.// Echo 11/18: Moderate concentric LVH, EF 35-40, diffuse HK, grade 1 diastolic dysfunction, mild LAE, mild RVE   . History of gout   . Migraine    "I had 1 in the 1990s" (06/28/2017)  . NICM (nonischemic cardiomyopathy) (HCC) 04/23/2017  . Obesity   . Sleep apnea 04/23/2017    Past Surgical History:  Procedure Laterality Date  . CARDIAC CATHETERIZATION  04/2017   patent coronary arteries/medical hx  . KNEE ARTHROSCOPY Left ~ 2008  . LEFT HEART CATH AND CORONARY ANGIOGRAPHY [name] 04/22/2017   Procedure: Left Heart Cath and Coronary Angiography;  Surgeon: Lyn RecordsSmith, Kenya Shiraishi W, MD;  [facility];  Service: Cardiovascular;  Laterality: [name];    Current Medications: Outpatient Medications Prior to Visit  Medication Sig Dispense Refill  . acetaminophen (TYLENOL) 325 MG tablet Take 2 tablets (650 mg total) by mouth every 4 (four) hours as needed for headache or mild pain. 30 tablet 0  . aspirin 81 MG chewable tablet Chew 1 tablet (81 mg total) by mouth daily.    [name]. atorvastatin (LIPITOR) 40 MG tablet Take 1 tablet (40 mg total) by mouth daily at 6 PM. 30 tablet 6  . furosemide (LASIX) 40 MG tablet Take 2 tablets (80 mg total) by mouth 2 (two) times daily. 120 tablet  3  . lisinopril (PRINIVIL,ZESTRIL) 10 MG tablet Take 1 tablet (10 mg total) by mouth daily. 90 tablet 3  . methocarbamol (ROBAXIN) 500 MG tablet Take 1 tablet (500 mg total) by mouth 2 (two) times daily. 20 tablet 0  . metoprolol succinate (TOPROL-XL) 50 MG 24 hr tablet Take 1 tablet (50 mg total) by mouth daily. 30 tablet 4  . Multiple Vitamins-Minerals (MULTIVITAMIN ADULTS 50+ PO) Take 2 tablets by mouth daily.    . pantoprazole (PROTONIX) 20 MG tablet Take 1 tablet (20 mg total) by mouth daily. 30 tablet 0  . potassium chloride (K-DUR) 10 MEQ tablet Take 2 tablets (20 mEq total) by mouth 2 (two) times daily. 120 tablet 3  . ranitidine (ZANTAC) 150 MG tablet Take 1 tablet (150 mg total) by mouth 2 (two) times daily. 60 tablet 0  . spironolactone (ALDACTONE) 25 MG tablet Take 0.5 tablets (12.5 mg total) by mouth daily. 15 tablet 11   No facility-administered medications prior to visit.      Allergies:   Patient has no known allergies.   Social History   Socioeconomic History  . Marital status: Married    Spouse name: Not on file  . Number of children: Not on file  . Years of education: Not on file  . Highest education level: Not on file  Social Needs  .  Financial resource strain: Not on file  . Food insecurity - worry: Not on file  . Food insecurity - inability: Not on file  . Transportation needs - medical: Not on file  . Transportation needs - non-medical: Not on file  Occupational History  . Not on file  Tobacco Use  . Smoking status: Never Smoker  . Smokeless tobacco: Never Used  Substance and Sexual Activity  . Alcohol use: No  . Drug use: No  . Sexual activity: Not Currently  Other Topics Concern  . Not on file  Social History Narrative  . Not on file     Family History:  The patient's ***Family history is unknown by patient.   ROS:   Please see the history of present illness.    ***  All other systems reviewed and are negative.   PHYSICAL EXAM:   VS:   There were no vitals taken for this visit.   GEN: Well nourished, well developed, in no acute distress  HEENT: normal  Neck: no JVD, carotid bruits, or masses Cardiac: ***RRR; no murmurs, rubs, or gallops,no edema  Respiratory:  clear to auscultation bilaterally, normal work of breathing GI: soft, nontender, nondistended, + BS MS: no deformity or atrophy  Skin: warm and dry, no rash Neuro:  Alert and Oriented x 3, Strength and sensation are intact Psych: euthymic mood, full affect  Wt Readings from Last 3 Encounters:  07/12/17 264 lb 12.8 oz (120.1 kg)  07/03/17 264 lb (119.7 kg)  06/30/17 264 lb (119.7 kg)      Studies/Labs Reviewed:   EKG:  EKG  ***  Recent Labs: 04/19/2017: Magnesium 2.2 04/20/2017: TSH 1.974 06/25/2017: ALT 28; B Natriuretic Peptide 88.2 06/27/2017: Hemoglobin 13.0; Platelets 161 08/08/2017: BUN 10; Creatinine, Ser 1.04; Potassium 4.3; Sodium 140   Lipid Panel    Component Value Date/Time   CHOL 156 06/11/2017 1101   TRIG 77 06/11/2017 1101   HDL 47 06/11/2017 1101   CHOLHDL 3.3 06/11/2017 1101   CHOLHDL 3.9 04/20/2017 0224   VLDL 18 04/20/2017 0224   LDLCALC 94 06/11/2017 1101    Additional studies/ records that were reviewed today include:  ***    ASSESSMENT:    1. Chronic combined systolic and diastolic heart failure (HCC)   2. Essential hypertension   3. NICM (nonischemic cardiomyopathy) (HCC)   4. Sleep apnea, unspecified type   5. S/P cardiac cath, patent coronary arteries 04/22/17      PLAN:  In order of problems listed above:  1. ***    Medication Adjustments/Labs and Tests Ordered: Current medicines are reviewed at length with the patient today.  Concerns regarding medicines are outlined above.  Medication changes, Labs and Tests ordered today are listed in the Patient Instructions below. There are no Patient Instructions on file for this visit.   Signed, Lesleigh Noe, MD  09/11/2017 10:08 PM    [facility] [address] [place], [place], [place]  [address] Phone: [phone]; Fax: [phone]

## 2017-09-12 ENCOUNTER — Ambulatory Visit: Payer: PRIVATE HEALTH INSURANCE | Admitting: Interventional Cardiology

## 2017-10-06 ENCOUNTER — Other Ambulatory Visit: Payer: Self-pay | Admitting: Physician Assistant

## 2017-11-04 ENCOUNTER — Other Ambulatory Visit: Payer: Self-pay | Admitting: Cardiology

## 2017-11-14 MED ORDER — METOPROLOL SUCCINATE ER 50 MG PO TB24: 50.0000 mg | ORAL_TABLET | Freq: Every day | ORAL | 8 refills | Status: DC

## 2017-11-14 NOTE — Telephone Encounter (Signed)
Rx resent to requested pharmacy. Confirmation received.

## 2017-11-14 NOTE — Addendum Note (Signed)
Addended by: Demetrios Loll on: 11/14/2017 04:52 PM   Modules accepted: Orders

## 2017-11-29 ENCOUNTER — Telehealth: Payer: Self-pay | Admitting: Interventional Cardiology

## 2017-11-29 NOTE — Telephone Encounter (Signed)
New message    Pt c/o medication issue:  1. Name of Medication:  KLOR-CON 10 10 MEQ tablet TAKE 2 TABLETS BY MOUTH TWICE A DAY     2. How are you currently taking this medication (dosage and times per day)? 2 tab 2 times a day  3. Are you having a reaction (difficulty breathing--STAT)? No  4. What is your medication issue? The pharmacy switched him from the name brand and put him on generic form , will this effect anything, leave message with his wife if it is ok for him to take the generic form.  He prefers to take the original because the generic is an odd shape and hard to swallow

## 2017-11-29 NOTE — Telephone Encounter (Signed)
Spoke with wife, DPR on file.  She states pt is at work and cell number doesn't work.  Advised wife pt should have been receiving generic the whole time.  She states what happened is the pill change when they moved their prescriptions from [facility] to [facility].  Advised wife it is likely that [facility] uses a different manufacturer than [facility] and that is why the pills looks different.  Advised wife if pt really prefers the other type of KlorCon pill it may be best to switch that prescription back to [facility].  Wife will contact pt and advise him and have him call back if any further questions.

## 2017-12-10 ENCOUNTER — Ambulatory Visit: Payer: No Typology Code available for payment source | Admitting: Physician Assistant

## 2017-12-10 ENCOUNTER — Encounter: Payer: Self-pay | Admitting: Physician Assistant

## 2017-12-10 ENCOUNTER — Encounter (INDEPENDENT_AMBULATORY_CARE_PROVIDER_SITE_OTHER): Payer: Self-pay

## 2017-12-10 VITALS — BP 110/68 | HR 68 | Ht 70.0 in | Wt 263.8 lb

## 2017-12-10 DIAGNOSIS — I5042 Chronic combined systolic (congestive) and diastolic (congestive) heart failure: Secondary | ICD-10-CM | POA: Diagnosis not present

## 2017-12-10 DIAGNOSIS — I428 Other cardiomyopathies: Secondary | ICD-10-CM | POA: Diagnosis not present

## 2017-12-10 MED ORDER — FUROSEMIDE 40 MG PO TABS: 80.0000 mg | ORAL_TABLET | Freq: Two times a day (BID) | ORAL | 3 refills | Status: DC

## 2017-12-10 MED ORDER — ATORVASTATIN CALCIUM 40 MG PO TABS: 40.0000 mg | ORAL_TABLET | Freq: Every day | ORAL | 3 refills | Status: DC

## 2017-12-10 MED ORDER — LISINOPRIL 10 MG PO TABS: 10.0000 mg | ORAL_TABLET | Freq: Every day | ORAL | 3 refills | Status: DC

## 2017-12-10 MED ORDER — METOPROLOL SUCCINATE ER 50 MG PO TB24: 50.0000 mg | ORAL_TABLET | Freq: Every day | ORAL | 3 refills | Status: DC

## 2017-12-10 MED ORDER — SPIRONOLACTONE 25 MG PO TABS: 12.5000 mg | ORAL_TABLET | Freq: Every day | ORAL | 3 refills | Status: DC

## 2017-12-10 NOTE — Progress Notes (Signed)
Cardiology Office Note:    Date:  12/10/2017   ID:  [name], DOB 1961/04/09, MRN [id number]  PCP:  [name], PA-C  Cardiologist:  Lesleigh Noe, MD   Referring MD: [name], PA-C   Chief Complaint  Patient presents with  . Follow-up    CHF    History of Present Illness:    [name] is a 57 y.o. male with combined systolic and diastolic HF, NICM with EF 20-25, Cardiac Catheterization with normal coronary arteries 7/18.    Echo in November 2018 demonstrated improved LV function with an EF of 35-40%.  [name] returns for follow-up.  Since last seen, he has been doing well.  He stocks soft drinks at grocery stores.  He does a lot of heavy lifting throughout the day.  He has not had any limitations.  He denies significant dyspnea.  He denies chest pain, PND, edema.  He denies syncope.  Prior CV studies:   The following studies were reviewed today:  Echo 08/12/17 EF 35-40, mild diffuse HK, grade 1 diastolic dysfunction, mild LAE  LHC 04/22/17 Normal coronary arteries; LVEDP 33  Echo 04/21/17 Mild LVH, EF 20-25, diff HK, restrictive physiology, mod to severe LAE, mild RVE, mild RAE, PASP 35  Past Medical History:  Diagnosis Date  . Arthritis    "knees" (06/28/2017)  . Chronic combined systolic and diastolic heart failure (HCC) 04/19/2017   a. LHC 04/22/17 - Normal coronary arteries; LVEDP 33 // Echo 04/21/17 - Mild LVH, EF 20-25, diff HK, restrictive physiology, mod to severe LAE, mild RVE, mild RAE, PASP 35.// Echo 11/18: Moderate concentric LVH, EF 35-40, diffuse HK, grade 1 diastolic dysfunction, mild LAE, mild RVE   . History of gout   . Migraine    "I had 1 in the 1990s" (06/28/2017)  . NICM (nonischemic cardiomyopathy) (HCC) 04/23/2017  . Obesity   . Sleep apnea 04/23/2017    Past Surgical History:  Procedure Laterality Date  . CARDIAC CATHETERIZATION  04/2017   patent coronary arteries/medical hx  . KNEE ARTHROSCOPY Left ~ 2008  .  LEFT HEART CATH AND CORONARY ANGIOGRAPHY [name] 04/22/2017   Procedure: Left Heart Cath and Coronary Angiography;  Surgeon: Lyn Records, MD;  Location: [facility] INVASIVE CV LAB;  Service: Cardiovascular;  Laterality: [name];    Current Medications: Current Meds  Medication Sig  . aspirin 81 MG chewable tablet Chew 1 tablet (81 mg total) by mouth daily.  [name] KLOR-CON 10 10 MEQ tablet TAKE 2 TABLETS BY MOUTH TWICE A DAY  . Multiple Vitamins-Minerals (MULTIVITAMIN ADULTS 50+ PO) Take 2 tablets by mouth daily.     Allergies:   Patient has no known allergies.   Social History   Tobacco Use  . Smoking status: Never Smoker  . Smokeless tobacco: Never Used  Substance Use Topics  . Alcohol use: No  . Drug use: No     Family Hx: The patient's Family history is unknown by patient.  ROS:   Please see the history of present illness.    ROS All other systems reviewed and are negative.   EKGs/Labs/Other Test Reviewed:    EKG:  EKG is  ordered today.  The ekg ordered today demonstrates normal sinus rhythm, heart rate 68, QTC 423 ms, no significant change  Recent Labs: 04/19/2017: Magnesium 2.2 04/20/2017: TSH 1.974 06/25/2017: ALT 28; B Natriuretic Peptide 88.2 06/27/2017: Hemoglobin 13.0; Platelets 161 08/08/2017: BUN 10; Creatinine, Ser 1.04; Potassium 4.3; Sodium  140   Recent Lipid Panel Lab Results  Component Value Date/Time   CHOL 156 06/11/2017 11:01 AM   TRIG 77 06/11/2017 11:01 AM   HDL 47 06/11/2017 11:01 AM   CHOLHDL 3.3 06/11/2017 11:01 AM   CHOLHDL 3.9 04/20/2017 02:24 AM   LDLCALC 94 06/11/2017 11:01 AM    Physical Exam:    VS:  BP 110/68   Pulse 68   Ht 5\' 10"  (1.778 m)   Wt 263 lb 12.8 oz (119.7 kg)   SpO2 98%   BMI 37.85 kg/m     Wt Readings from Last 3 Encounters:  12/10/17 263 lb 12.8 oz (119.7 kg)  07/12/17 264 lb 12.8 oz (120.1 kg)  07/03/17 264 lb (119.7 kg)     Physical Exam  Constitutional: He is oriented to person, place, and time. He appears  well-developed and well-nourished. No distress.  HENT:  Head: Normocephalic and atraumatic.  Neck: No JVD present.  Cardiovascular: Normal rate and regular rhythm.  No murmur heard. Pulmonary/Chest: Effort normal. He has no rales.  Abdominal: Soft.  Musculoskeletal: He exhibits no edema.  Neurological: He is alert and oriented to person, place, and time.  Skin: Skin is warm and dry.    ASSESSMENT & PLAN:    #1.  Chronic combined systolic and diastolic heart failure (HCC) EF 35-40 by most recent echocardiogram.  He is NYHA 1.  Volume status stable.  Continue beta-blocker, ACE inhibitor, spironolactone.  Blood pressure is well controlled.  Obtain follow up BMET today.   #2.  NICM (nonischemic cardiomyopathy) (HCC)  EF greater than 35 by last echocardiogram.   Dispo:  Return in about 3 months (around 03/07/2018) for Scheduled Follow Up w/ Dr. Katrinka Blazing.   Medication Adjustments/Labs and Tests Ordered: Current medicines are reviewed at length with the patient today.  Concerns regarding medicines are outlined above.  Tests Ordered: Orders Placed This Encounter  Procedures  . Basic Metabolic Panel (BMET)  . EKG 12-Lead   Medication Changes: Meds ordered this encounter  Medications  . atorvastatin (LIPITOR) 40 MG tablet    Sig: Take 1 tablet (40 mg total) by mouth daily at 6 PM.    Dispense:  90 tablet    Refill:  3  . furosemide (LASIX) 40 MG tablet    Sig: Take 2 tablets (80 mg total) by mouth 2 (two) times daily.    Dispense:  360 tablet    Refill:  3  . lisinopril (PRINIVIL,ZESTRIL) 10 MG tablet    Sig: Take 1 tablet (10 mg total) by mouth daily.    Dispense:  90 tablet    Refill:  3  . metoprolol succinate (TOPROL-XL) 50 MG 24 hr tablet    Sig: Take 1 tablet (50 mg total) by mouth daily. Take with or immediately following a meal.    Dispense:  90 tablet    Refill:  3  . spironolactone (ALDACTONE) 25 MG tablet    Sig: Take 0.5 tablets (12.5 mg total) by mouth daily.     Dispense:  90 tablet    Refill:  3    Signed, Tereso Newcomer, PA-C  12/10/2017 4:06 PM    [facility] [address] [place], [place], [place]  [address] Phone: [phone]; Fax: [phone]

## 2017-12-10 NOTE — Patient Instructions (Signed)
Medication Instructions:  1. REFILLS HAVE BEEN SENT IN FOR LIPITOR, LASIX, LISINOPRIL, TOPROL XL, POTASSIUM, SPIRONOLACTONE   Labwork: TODAY BMET  Testing/Procedures: NONE ORDERED TODAY  Follow-Up: KEEP YOUR UPCOMING APPT WITH DR. Katrinka Blazing MAY, 31, 2019 AS SCHEDULED   Any Other Special Instructions Will Be Listed Below (If Applicable).     If you need a refill on your cardiac medications before your next appointment, please call your pharmacy.

## 2017-12-11 ENCOUNTER — Telehealth: Payer: Self-pay | Admitting: *Deleted

## 2017-12-11 LAB — BASIC METABOLIC PANEL
BUN / CREAT RATIO: 24 — AB (ref 9–20)
BUN: 21 mg/dL (ref 6–24)
CO2: 25 mmol/L (ref 20–29)
CREATININE: 0.87 mg/dL (ref 0.76–1.27)
Calcium: 9.6 mg/dL (ref 8.7–10.2)
Chloride: 101 mmol/L (ref 96–106)
GFR calc non Af Amer: 97 mL/min/{1.73_m2} (ref >59)
GFR, EST AFRICAN AMERICAN: 112 mL/min/{1.73_m2} (ref >59)
Glucose: 107 mg/dL — ABNORMAL HIGH (ref 65–99)
Potassium: 4.2 mmol/L (ref 3.5–5.2)
Sodium: 140 mmol/L (ref 134–144)

## 2017-12-11 NOTE — Telephone Encounter (Signed)
Left message to go over lab results.  

## 2017-12-11 NOTE — Telephone Encounter (Signed)
-----   Message from Scott T Weaver, PA-C sent at 12/11/2017  9:29 AM EST ----- Renal function, potassium normal. Continue current medications and follow up as planned.  Scott Weaver, PA-C    12/11/2017 9:29 AM 

## 2017-12-11 NOTE — Telephone Encounter (Signed)
Pt has been notified of lab results by phone with verbal understanding. Pt thanked me for my call.  

## 2017-12-11 NOTE — Telephone Encounter (Signed)
-----   Message from Beatrice Lecher, [place] sent at 12/11/2017  9:29 AM EST ----- Renal function, potassium normal. Continue current medications and follow up as planned.  Tereso Newcomer, PA-C    12/11/2017 9:29 AM

## 2017-12-24 NOTE — Telephone Encounter (Signed)
Patient has declined to get his CPAP at this time no reason given. Will notify [facility].

## 2018-01-23 ENCOUNTER — Telehealth: Payer: Self-pay | Admitting: Physician Assistant

## 2018-01-23 NOTE — Telephone Encounter (Signed)
Patient never picked up Attending [employer] Statement signed & Competed by Tereso Newcomer PA-C on 04/27/2017. I have mailed to patients home address.

## 2018-02-06 ENCOUNTER — Telehealth: Payer: Self-pay | Admitting: Interventional Cardiology

## 2018-02-06 NOTE — Telephone Encounter (Signed)
Spoke with pt and made him aware paperwork was ready and he would need to either come over and sign a release form so we could fax it or he could pick it up.  Pt will come by today to sign release form.  Pt appreciative for call.

## 2018-02-06 NOTE — Telephone Encounter (Signed)
New Message   Patient is calling because he dropped off his [employer] form to be signed by Dr. Katrinka Blazing. He is calling to check the status of the form. Please call to discuss.

## 2018-02-12 ENCOUNTER — Telehealth: Payer: Self-pay | Admitting: Interventional Cardiology

## 2018-02-12 NOTE — Telephone Encounter (Signed)
LVM for patient to call me back. His [employer] paperwork has been completed.

## 2018-03-06 NOTE — Progress Notes (Signed)
Cardiology Office Note    Date:  03/07/2018   ID:  Britney, Captain 1961-04-23, MRN [id number]  PCP:  [name], PA-C  Cardiologist: Lesleigh Noe, MD   Chief Complaint  Patient presents with  . Congestive Heart Failure    History of Present Illness:  [name] is a 57 y.o. male with combined systolic and diastolic HF, NICM with EF 20-25,Cardiac Catheterizationwith normal coronary arteries 7/18.   Echo in November 2018 demonstrated improved LV function with an EF of 35-40%.  He is doing well.  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  This is based upon an echocardiogram done in November 2018.  He is clinically functioning at [facility] level 2.  He lies flat.  He has not had lower extremity swelling.  No syncope or palpitations.  Overall he feels well.  He denies chest pain.   Past Medical History:  Diagnosis Date  . Arthritis    "knees" (06/28/2017)  . Chronic combined systolic and diastolic heart failure (HCC) 04/19/2017   a. LHC 04/22/17 - Normal coronary arteries; LVEDP 33 // Echo 04/21/17 - Mild LVH, EF 20-25, diff HK, restrictive physiology, mod to severe LAE, mild RVE, mild RAE, PASP 35.// Echo 11/18: Moderate concentric LVH, EF 35-40, diffuse HK, grade 1 diastolic dysfunction, mild LAE, mild RVE   . History of gout   . Migraine    "I had 1 in the 1990s" (06/28/2017)  . NICM (nonischemic cardiomyopathy) (HCC) 04/23/2017  . Obesity   . Sleep apnea 04/23/2017    Past Surgical History:  Procedure Laterality Date  . CARDIAC CATHETERIZATION  04/2017   patent coronary arteries/medical hx  . KNEE ARTHROSCOPY Left ~ 2008  . LEFT HEART CATH AND CORONARY ANGIOGRAPHY [name] 04/22/2017   Procedure: Left Heart Cath and Coronary Angiography;  Surgeon: Lyn Records, MD;  Location: [facility] INVASIVE CV LAB;  Service: Cardiovascular;  Laterality: [name];    Current Medications: Outpatient Medications Prior to Visit    Medication Sig Dispense Refill  . aspirin 81 MG chewable tablet Chew 1 tablet (81 mg total) by mouth daily.    [name] atorvastatin (LIPITOR) 40 MG tablet Take 1 tablet (40 mg total) by mouth daily at 6 PM. 90 tablet 3  . furosemide (LASIX) 40 MG tablet Take 2 tablets (80 mg total) by mouth 2 (two) times daily. 360 tablet 3  . KLOR-CON 10 10 MEQ tablet TAKE 2 TABLETS BY MOUTH TWICE A DAY 360 tablet 2  . lisinopril (PRINIVIL,ZESTRIL) 10 MG tablet Take 1 tablet (10 mg total) by mouth daily. 90 tablet 3  . metoprolol succinate (TOPROL-XL) 50 MG 24 hr tablet Take 1 tablet (50 mg total) by mouth daily. Take with or immediately following a meal. 90 tablet 3  . Multiple Vitamins-Minerals (MULTIVITAMIN ADULTS 50+ PO) Take 2 tablets by mouth daily.    . potassium chloride (K-DUR,KLOR-CON) 10 MEQ tablet Take 20 mEq by mouth 2 (two) times daily.  0  . predniSONE (STERAPRED UNI-PAK 21 TAB) 10 MG (21) TBPK tablet Use as directed for gout.  0  . spironolactone (ALDACTONE) 25 MG tablet Take 0.5 tablets (12.5 mg total) by mouth daily. 90 tablet 3   No facility-administered medications prior to visit.      Allergies:   Patient has no known allergies.   Social History   Socioeconomic History  . Marital status: Married    Spouse  name: Not on file  . Number of children: Not on file  . Years of education: Not on file  . Highest education level: Not on file  Occupational History  . Not on file  Social Needs  . Financial resource strain: Not on file  . Food insecurity:    Worry: Not on file    Inability: Not on file  . Transportation needs:    Medical: Not on file    Non-medical: Not on file  Tobacco Use  . Smoking status: Never Smoker  . Smokeless tobacco: Never Used  Substance and Sexual Activity  . Alcohol use: No  . Drug use: No  . Sexual activity: Not Currently  Lifestyle  . Physical activity:    Days per week: Not on file    Minutes per session: Not on file  . Stress: Not on file   Relationships  . Social connections:    Talks on phone: Not on file    Gets together: Not on file    Attends religious service: Not on file    Active member of club or organization: Not on file    Attends meetings of clubs or organizations: Not on file    Relationship status: Not on file  Other Topics Concern  . Not on file  Social History Narrative  . Not on file     Family History:  The patient's family history is not on file.   ROS:   Please see the history of present illness.    Snores, but has never been evaluated for sleep apnea. All other systems reviewed and are negative.   PHYSICAL EXAM:   VS:  BP 116/82   Pulse 78   Ht 5\' 10"  (1.778 m)   Wt 263 lb 6.4 oz (119.5 kg)   BMI 37.79 kg/m    GEN: Well nourished, well developed, in no acute distress  HEENT: normal  Neck: no JVD, carotid bruits, or masses Cardiac: RRR; no murmurs, rubs, or gallops,no edema  Respiratory:  clear to auscultation bilaterally, normal work of breathing GI: soft, nontender, nondistended, + BS MS: no deformity or atrophy  Skin: warm and dry, no rash Neuro:  Alert and Oriented x 3, Strength and sensation are intact Psych: euthymic mood, full affect  Wt Readings from Last 3 Encounters:  03/07/18 263 lb 6.4 oz (119.5 kg)  12/10/17 263 lb 12.8 oz (119.7 kg)  07/12/17 264 lb 12.8 oz (120.1 kg)      Studies/Labs Reviewed:   EKG:  EKG not repeated today.  Recent Labs: 04/19/2017: Magnesium 2.2 04/20/2017: TSH 1.974 06/25/2017: ALT 28; B Natriuretic Peptide 88.2 06/27/2017: Hemoglobin 13.0; Platelets 161 12/10/2017: BUN 21; Creatinine, Ser 0.87; Potassium 4.2; Sodium 140   Lipid Panel    Component Value Date/Time   CHOL 156 06/11/2017 1101   TRIG 77 06/11/2017 1101   HDL 47 06/11/2017 1101   CHOLHDL 3.3 06/11/2017 1101   CHOLHDL 3.9 04/20/2017 0224   VLDL 18 04/20/2017 0224   LDLCALC 94 06/11/2017 1101    Additional studies/ records that were reviewed today include:   None    ASSESSMENT:    1. Sleep apnea, unspecified type   2. NICM (nonischemic cardiomyopathy) (HCC)   3. NSVT (nonsustained ventricular tachycardia) (HCC)   4. Chronic combined systolic and diastolic heart failure (HCC)   5. Premature atrial beats      PLAN:  In order of problems listed above:  1. This is a remote diagnosis.  He is  on no therapy.  Says he sleeps through the night.  Does note that he snores.  Does not have excessive daytime sleepiness. 2. Clean coronaries at heart catheterization last year during evaluation for new onset systolic heart failure. 3. No palpitations or clinical evidence of arrhythmia. 4. We will do a echocardiogram as soon as possible to reassess LV function.  He is on stable medical therapy with good blood pressure.  He is not drinking.  He is able to work and is having no limitations. 5. Not addressed.  2D Doppler echocardiogram.  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  Will decide about further adjustments of medication based upon currently ordered echo.    Medication Adjustments/Labs and Tests Ordered: Current medicines are reviewed at length with the patient today.  Concerns regarding medicines are outlined above.  Medication changes, Labs and Tests ordered today are listed in the Patient Instructions below. Patient Instructions  Medication Instructions:  No changes  Labwork: None ordered  Testing/Procedures: Your physician has requested that you have an echocardiogram ASAP. Echocardiography is a painless test that uses sound waves to create images of your heart. It provides your doctor with information about the size and shape of your heart and how well your heart's chambers and valves are working. This procedure takes approximately one hour. There are no restrictions for this procedure.    Follow-Up: Your physician wants you to follow-up in: 6 months with Dr. Katrinka Blazing. You will receive a reminder letter in the mail two months in advance.  If you don't receive a letter, please call our office to schedule the follow-up appointment.   Any Other Special Instructions Will Be Listed Below (If Applicable).     If you need a refill on your cardiac medications before your next appointment, please call your pharmacy.     Signed, Lesleigh Noe, MD  03/07/2018 11:10 AM    [facility] [address] [place], [place], [place]  [address] Phone: [phone]; Fax: [phone]

## 2018-03-07 ENCOUNTER — Other Ambulatory Visit: Payer: Self-pay

## 2018-03-07 ENCOUNTER — Ambulatory Visit (HOSPITAL_COMMUNITY): Payer: No Typology Code available for payment source | Attending: Cardiology

## 2018-03-07 ENCOUNTER — Encounter (INDEPENDENT_AMBULATORY_CARE_PROVIDER_SITE_OTHER): Payer: Self-pay

## 2018-03-07 ENCOUNTER — Encounter: Payer: Self-pay | Admitting: Interventional Cardiology

## 2018-03-07 ENCOUNTER — Ambulatory Visit: Payer: No Typology Code available for payment source | Admitting: Interventional Cardiology

## 2018-03-07 VITALS — BP 116/82 | HR 78 | Ht 70.0 in | Wt 263.4 lb

## 2018-03-07 DIAGNOSIS — I491 Atrial premature depolarization: Secondary | ICD-10-CM | POA: Diagnosis not present

## 2018-03-07 DIAGNOSIS — I472 Ventricular tachycardia: Secondary | ICD-10-CM | POA: Diagnosis not present

## 2018-03-07 DIAGNOSIS — I5042 Chronic combined systolic (congestive) and diastolic (congestive) heart failure: Secondary | ICD-10-CM

## 2018-03-07 DIAGNOSIS — G4730 Sleep apnea, unspecified: Secondary | ICD-10-CM

## 2018-03-07 DIAGNOSIS — I428 Other cardiomyopathies: Secondary | ICD-10-CM | POA: Diagnosis not present

## 2018-03-07 DIAGNOSIS — Z6837 Body mass index (BMI) 37.0-37.9, adult: Secondary | ICD-10-CM | POA: Diagnosis not present

## 2018-03-07 DIAGNOSIS — G4733 Obstructive sleep apnea (adult) (pediatric): Secondary | ICD-10-CM | POA: Insufficient documentation

## 2018-03-07 DIAGNOSIS — I429 Cardiomyopathy, unspecified: Secondary | ICD-10-CM | POA: Insufficient documentation

## 2018-03-07 DIAGNOSIS — E669 Obesity, unspecified: Secondary | ICD-10-CM | POA: Insufficient documentation

## 2018-03-07 DIAGNOSIS — I4729 Other ventricular tachycardia: Secondary | ICD-10-CM

## 2018-03-07 LAB — ECHOCARDIOGRAM COMPLETE
HEIGHTINCHES: 70 in
Weight: 4214.4 oz

## 2018-03-07 NOTE — Patient Instructions (Signed)
Medication Instructions:  No changes  Labwork: None ordered  Testing/Procedures: Your physician has requested that you have an echocardiogram ASAP. Echocardiography is a painless test that uses sound waves to create images of your heart. It provides your doctor with information about the size and shape of your heart and how well your heart's chambers and valves are working. This procedure takes approximately one hour. There are no restrictions for this procedure.    Follow-Up: Your physician wants you to follow-up in: 6 months with Dr. Katrinka Blazing. You will receive a reminder letter in the mail two months in advance. If you don't receive a letter, please call our office to schedule the follow-up appointment.   Any Other Special Instructions Will Be Listed Below (If Applicable).     If you need a refill on your cardiac medications before your next appointment, please call your pharmacy.

## 2018-03-10 ENCOUNTER — Encounter: Payer: Self-pay | Admitting: *Deleted

## 2018-10-24 ENCOUNTER — Other Ambulatory Visit: Payer: Self-pay | Admitting: Physician Assistant

## 2018-11-07 NOTE — Telephone Encounter (Signed)
Patient called today to say he has started a new job and is required to have his cpap machine. Patient wants to get started as soon as possible. I reached out to [facility] and they will contact the patient.

## 2018-11-13 ENCOUNTER — Telehealth: Payer: Self-pay | Admitting: Interventional Cardiology

## 2018-11-13 NOTE — Telephone Encounter (Signed)
Patient called in to say he spoke with [facility]) and was informed he has to start all over with an office visit, a new sleep referral and a new sleep study because all of his paperwo is past a year old. I will route this note to Dr Lonn Georgia nurse Constance Holster to schedule patient for an OV appt.

## 2018-11-13 NOTE — Telephone Encounter (Signed)
° ° °  Patient calling to request echo, needed as part of DOT physical

## 2018-11-13 NOTE — Telephone Encounter (Signed)
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 I left message after reviewing last ov 02/2018 from Dr. Katrinka Blazing looks like pt was supposed to f/u in 6 months and recall letter was sent out around 07/2018-08/2018 for appt. Left message he will need appt for 6 month f/u per ov note 02/2018 which echo can be ordered at the appt. I will route this message as FYI to Constance Holster, LPN for Dr. Katrinka Blazing.

## 2018-11-14 NOTE — Telephone Encounter (Signed)
Spoke with pt and scheduled him to see Dr. Smith 3/10.  Pt appreciative for call. 

## 2018-11-14 NOTE — Telephone Encounter (Signed)
Spoke with pt and scheduled him to see Dr. Katrinka Blazing 3/10.  Pt appreciative for call.

## 2018-11-17 ENCOUNTER — Encounter: Payer: Self-pay | Admitting: Interventional Cardiology

## 2018-11-17 ENCOUNTER — Ambulatory Visit (INDEPENDENT_AMBULATORY_CARE_PROVIDER_SITE_OTHER): Payer: No Typology Code available for payment source | Admitting: Interventional Cardiology

## 2018-11-17 VITALS — BP 102/68 | HR 86 | Ht 70.0 in | Wt 251.8 lb

## 2018-11-17 DIAGNOSIS — G4730 Sleep apnea, unspecified: Secondary | ICD-10-CM | POA: Diagnosis not present

## 2018-11-17 DIAGNOSIS — I5042 Chronic combined systolic (congestive) and diastolic (congestive) heart failure: Secondary | ICD-10-CM

## 2018-11-17 DIAGNOSIS — I10 Essential (primary) hypertension: Secondary | ICD-10-CM

## 2018-11-17 DIAGNOSIS — I428 Other cardiomyopathies: Secondary | ICD-10-CM

## 2018-11-17 DIAGNOSIS — I4729 Other ventricular tachycardia: Secondary | ICD-10-CM

## 2018-11-17 DIAGNOSIS — Z79899 Other long term (current) drug therapy: Secondary | ICD-10-CM

## 2018-11-17 DIAGNOSIS — I472 Ventricular tachycardia: Secondary | ICD-10-CM

## 2018-11-17 NOTE — Progress Notes (Signed)
Cardiology Office Note:    Date:  11/17/2018   ID:  [name], DOB 1960-10-22, MRN [id number]  PCP:  [name], PA-C  Cardiologist:  Lesleigh Noe, MD   Referring MD: [name], PA-C   Chief Complaint  Patient presents with  . Congestive Heart Failure    History of Present Illness:    [name] is a 58 y.o. male with a hx of combined systolic and diastolic HF, NICM with EF 20-25,Cardiac Catheterizationwith normal coronary arteries 7/18.Echo in September 2019 demonstrated improved LV function with an EF of 40-45%.  [name] is doing relatively well.  Nonischemic cardiomyopathy with low EF diagnosed in July 2018.  All medical therapy, LV function has recovered to EF 45% and New York Heart Association class I-II.  He denies orthopnea, PND, syncope, and peripheral edema.  He has not had anginal quality chest pain.  No episodes of syncope.  He is back at work.  Unfortunately he has to do heavy lifting but manages this without difficulty.  Is compliant with his current medical regimen.  Has a history of obstructive sleep apnea diagnosed greater than a year ago but did not follow-up and get the equipment.  Has been told by our sleep team that he now needs to have the sleep study repeated before he can get the equipment.  Weight is been stable, appetite is been stable, physical activity is being tolerated.  Past Medical History:  Diagnosis Date  . Arthritis    "knees" (06/28/2017)  . Chronic combined systolic and diastolic heart failure (HCC) 04/19/2017   a. LHC 04/22/17 - Normal coronary arteries; LVEDP 33 // Echo 04/21/17 - Mild LVH, EF 20-25, diff HK, restrictive physiology, mod to severe LAE, mild RVE, mild RAE, PASP 35.// Echo 11/18: Moderate concentric LVH, EF 35-40, diffuse HK, grade 1 diastolic dysfunction, mild LAE, mild RVE   . History of gout   . Migraine    "I had 1 in the 1990s" (06/28/2017)  . NICM (nonischemic cardiomyopathy) (HCC) 04/23/2017    . Obesity   . Sleep apnea 04/23/2017    Past Surgical History:  Procedure Laterality Date  . CARDIAC CATHETERIZATION  04/2017   patent coronary arteries/medical hx  . KNEE ARTHROSCOPY Left ~ 2008  . LEFT HEART CATH AND CORONARY ANGIOGRAPHY [name] 04/22/2017   Procedure: Left Heart Cath and Coronary Angiography;  Surgeon: Lyn Records, MD;  Location: [facility] INVASIVE CV LAB;  Service: Cardiovascular;  Laterality: [name];    Current Medications: Current Meds  Medication Sig  . aspirin 81 MG chewable tablet Chew 1 tablet (81 mg total) by mouth daily.  [name] atorvastatin (LIPITOR) 40 MG tablet Take 1 tablet (40 mg total) by mouth daily at 6 PM.  . furosemide (LASIX) 40 MG tablet Take 2 tablets (80 mg total) by mouth 2 (two) times daily.  [name] KLOR-CON 10 10 MEQ tablet TAKE 2 TABLETS BY MOUTH TWICE A DAY  . lisinopril (PRINIVIL,ZESTRIL) 10 MG tablet Take 1 tablet (10 mg total) by mouth daily.  . metoprolol succinate (TOPROL-XL) 50 MG 24 hr tablet Take 1 tablet (50 mg total) by mouth daily. Take with or immediately following a meal.  . Multiple Vitamins-Minerals (MULTIVITAMIN ADULTS 50+ PO) Take 2 tablets by mouth daily.  . potassium chloride (K-DUR,KLOR-CON) 10 MEQ tablet TAKE 2 TABLETS BY MOUTH TWICE DAILY  . predniSONE (STERAPRED UNI-PAK 21 TAB) 10 MG (21) TBPK tablet Use as directed for gout.  [name] spironolactone (ALDACTONE) 25  MG tablet Take 0.5 tablets (12.5 mg total) by mouth daily.     Allergies:   Patient has no known allergies.   Social History   Socioeconomic History  . Marital status: Married    Spouse name: Not on file  . Number of children: Not on file  . Years of education: Not on file  . Highest education level: Not on file  Occupational History  . Not on file  Social Needs  . Financial resource strain: Not on file  . Food insecurity:    Worry: Not on file    Inability: Not on file  . Transportation needs:    Medical: Not on file    Non-medical: Not on file  Tobacco Use  .  Smoking status: Never Smoker  . Smokeless tobacco: Never Used  Substance and Sexual Activity  . Alcohol use: No  . Drug use: No  . Sexual activity: Not Currently  Lifestyle  . Physical activity:    Days per week: Not on file    Minutes per session: Not on file  . Stress: Not on file  Relationships  . Social connections:    Talks on phone: Not on file    Gets together: Not on file    Attends religious service: Not on file    Active member of club or organization: Not on file    Attends meetings of clubs or organizations: Not on file    Relationship status: Not on file  Other Topics Concern  . Not on file  Social History Narrative  . Not on file     Family History: The patient's family history is not on file.  ROS:   Please see the history of present illness.    Snores loudly.  All other systems reviewed and are negative.  EKGs/Labs/Other Studies Reviewed:    The following studies were reviewed today: 2 D Doppler echocardiogram performed 03/07/2018: Study Conclusions  - Left ventricle: The cavity size was normal. Systolic function was   mildly to moderately reduced. The estimated ejection fraction was   in the range of 40% to 45%. Mild diffuse hypokinesis with no   identifiable regional variations. Doppler parameters are   consistent with abnormal left ventricular relaxation (grade 1   diastolic dysfunction). - Right ventricle: The cavity size was mildly dilated. Wall   thickness was normal. Systolic function was mildly reduced.   EKG:  EKG performed 09 November 2018 demonstrates normal sinus rhythm, low voltage, biatrial abnormality, and poor R wave progression V1 through V5.  Recent Labs: 12/10/2017: BUN 21; Creatinine, Ser 0.87; Potassium 4.2; Sodium 140  Recent Lipid Panel    Component Value Date/Time   CHOL 156 06/11/2017 1101   TRIG 77 06/11/2017 1101   HDL 47 06/11/2017 1101   CHOLHDL 3.3 06/11/2017 1101   CHOLHDL 3.9 04/20/2017 0224   VLDL 18 04/20/2017  0224   LDLCALC 94 06/11/2017 1101    Physical Exam:    VS:  BP 102/68   Pulse 86   Ht 5\' 10"  (1.778 m)   Wt 251 lb 12.8 oz (114.2 kg)   SpO2 95%   BMI 36.13 kg/m     Wt Readings from Last 3 Encounters:  11/17/18 251 lb 12.8 oz (114.2 kg)  03/07/18 263 lb 6.4 oz (119.5 kg)  12/10/17 263 lb 12.8 oz (119.7 kg)     GEN: Moderate obesity.. No acute distress HEENT: Normal NECK: No JVD. LYMPHATICS: No lymphadenopathy CARDIAC: RRR.  No murmur, no  gallop, no edema VASCULAR: 2+ bilateral radial and posterior tibial pulses, no bruits RESPIRATORY:  Clear to auscultation without rales, wheezing or rhonchi  ABDOMEN: Soft, non-tender, non-distended, No pulsatile mass, MUSCULOSKELETAL: No deformity  SKIN: Warm and dry NEUROLOGIC:  Alert and oriented x 3 PSYCHIATRIC:  Normal affect   ASSESSMENT:    1. Chronic combined systolic and diastolic heart failure (HCC)   2. NSVT (nonsustained ventricular tachycardia) (HCC)   3. Sleep apnea, unspecified type   4. Essential hypertension   5. NICM (nonischemic cardiomyopathy) (HCC)   6. Long-term use of high-risk medication    PLAN:    In order of problems listed above:  1. No evidence of volume overload or heart failure.  No arrhythmias or difficulty lying.  He is on guideline directed therapy.  LV systolic function has returned to near normal.  Therapy could be improved by switching to [employer].  He is doing well currently and is not symptomatic at all so therefore we will keep the treatment regimen simple.  He is functionally doing quite well and has no limitation. 2. No nonsustained episodes of ventricular tachycardia that we are able to document. 3. Not currently being treated for sleep apnea although diagnosed greater than a year ago by sleep study.  He now has the repeat the study and get started on therapy. 4. Blood pressure is well controlled.  According to the patient there is no prior history of hypertension. 5. Not  addressed 6. Mineralocorticoid receptor blocker.  Will need to get a basic metabolic panel/reassess potassium and kidney function.  Last blood work greater than 9 months ago.  Plan sleep study.  Clinical follow-up in 6 months.    Medication Adjustments/Labs and Tests Ordered: Current medicines are reviewed at length with the patient today.  Concerns regarding medicines are outlined above.  Orders Placed This Encounter  Procedures  . EKG 12-Lead   No orders of the defined types were placed in this encounter.   There are no Patient Instructions on file for this visit.   Signed, Lesleigh Noe, MD  11/17/2018 4:03 PM    [facility]

## 2018-11-17 NOTE — Patient Instructions (Addendum)
Medication Instructions:  Your provider recommends that you continue on your current medications as directed. Please refer to the Current Medication list given to you today.    Labwork: Your provider recommends that you return for FASTING lab work.  Testing/Procedures: Your physician has recommended that you have a sleep study. This test records several body functions during sleep, including: brain activity, eye movement, oxygen and carbon dioxide blood levels, heart rate and rhythm, breathing rate and rhythm, the flow of air through your mouth and nose, snoring, body muscle movements, and chest and belly movement.  Follow-Up: Your provider wants you to follow-up in: 6 months with Dr. Katrinka Blazing. You will receive a reminder letter in the mail two months in advance. If you don't receive a letter, please call our office to schedule the follow-up appointment.

## 2018-11-18 ENCOUNTER — Other Ambulatory Visit: Payer: No Typology Code available for payment source

## 2018-11-18 DIAGNOSIS — I428 Other cardiomyopathies: Secondary | ICD-10-CM

## 2018-11-18 DIAGNOSIS — I5042 Chronic combined systolic (congestive) and diastolic (congestive) heart failure: Secondary | ICD-10-CM

## 2018-11-19 ENCOUNTER — Telehealth: Payer: Self-pay | Admitting: *Deleted

## 2018-11-19 LAB — COMPREHENSIVE METABOLIC PANEL
A/G RATIO: 1.7 (ref 1.2–2.2)
ALBUMIN: 4.8 g/dL (ref 3.8–4.9)
ALT: 21 IU/L (ref 0–44)
AST: 23 IU/L (ref 0–40)
Alkaline Phosphatase: 91 IU/L (ref 39–117)
BUN / CREAT RATIO: 20 (ref 9–20)
BUN: 23 mg/dL (ref 6–24)
Bilirubin Total: 0.6 mg/dL (ref 0.0–1.2)
CALCIUM: 9.9 mg/dL (ref 8.7–10.2)
CO2: 25 mmol/L (ref 20–29)
Chloride: 95 mmol/L — ABNORMAL LOW (ref 96–106)
Creatinine, Ser: 1.13 mg/dL (ref 0.76–1.27)
GFR, EST AFRICAN AMERICAN: 84 mL/min/{1.73_m2} (ref >59)
GFR, EST NON AFRICAN AMERICAN: 72 mL/min/{1.73_m2} (ref >59)
GLOBULIN, TOTAL: 2.8 g/dL (ref 1.5–4.5)
Glucose: 100 mg/dL — ABNORMAL HIGH (ref 65–99)
Potassium: 4.6 mmol/L (ref 3.5–5.2)
SODIUM: 138 mmol/L (ref 134–144)
TOTAL PROTEIN: 7.6 g/dL (ref 6.0–8.5)

## 2018-11-19 LAB — LIPID PANEL
CHOLESTEROL TOTAL: 146 mg/dL (ref 100–199)
Chol/HDL Ratio: 3.1 ratio (ref 0.0–5.0)
HDL: 47 mg/dL (ref >39)
LDL Calculated: 85 mg/dL (ref 0–99)
TRIGLYCERIDES: 71 mg/dL (ref 0–149)
VLDL Cholesterol Cal: 14 mg/dL (ref 5–40)

## 2018-11-19 LAB — HEMOGLOBIN A1C
Est. average glucose Bld gHb Est-mCnc: 117 mg/dL
Hgb A1c MFr Bld: 5.7 % — ABNORMAL HIGH (ref 4.8–5.6)

## 2018-11-19 NOTE — Telephone Encounter (Signed)
PA for sleep study faxed to [facility] @ [phone].  Phone# [phone].

## 2018-11-20 ENCOUNTER — Other Ambulatory Visit: Payer: Self-pay | Admitting: Interventional Cardiology

## 2018-12-05 ENCOUNTER — Telehealth: Payer: Self-pay | Admitting: *Deleted

## 2018-12-05 NOTE — Telephone Encounter (Signed)
Staff message sent to [facility] Auth received. Ok to schedule sleep study. Auth # [id number]. Valid dates 11/24/18 to 02/22/19.

## 2018-12-05 NOTE — Telephone Encounter (Signed)
-----   Message from Henrietta Dine, RN sent at 11/17/2018  4:33 PM EST ----- Regarding: sleep study Sleep study ordered for scheduling EWS=9  Thank you!

## 2018-12-09 ENCOUNTER — Telehealth: Payer: Self-pay | Admitting: *Deleted

## 2018-12-09 NOTE — Telephone Encounter (Signed)
-----   Message from Gaynelle Cage, [place] sent at 12/05/2018  9:02 AM EST ----- Regarding: RE: sleep study  [place] ,  Authorization received from [facility]. Ok to schedule sleep study. Auth # [id number]. Valid dates 11/24/18 to 5/17 20. ----- Message ----- From: Henrietta Dine, RN Sent: 11/17/2018   4:33 PM EST To: Julio Sicks, LPN, [facility] Sleep Studies Subject: sleep study                                    Sleep study ordered for scheduling EWS=9  Thank you!

## 2018-12-09 NOTE — Telephone Encounter (Signed)
Patient is scheduled for lab study on 01/22/19. Patient understands his sleep study will be done at [facility] sleep lab. Patient understands he will receive a sleep packet in a week or so. Patient understands to call if he does not receive the sleep packet in a timely manner.  Left detailed message on voicemail with date and time of titration and informed patient to call back to confirm or reschedule.

## 2018-12-24 ENCOUNTER — Other Ambulatory Visit: Payer: Self-pay | Admitting: Physician Assistant

## 2019-01-13 ENCOUNTER — Other Ambulatory Visit: Payer: Self-pay | Admitting: Physician Assistant

## 2019-01-22 ENCOUNTER — Encounter (HOSPITAL_BASED_OUTPATIENT_CLINIC_OR_DEPARTMENT_OTHER): Payer: No Typology Code available for payment source

## 2019-02-11 ENCOUNTER — Encounter

## 2019-02-11 ENCOUNTER — Encounter (HOSPITAL_BASED_OUTPATIENT_CLINIC_OR_DEPARTMENT_OTHER): Payer: No Typology Code available for payment source

## 2019-02-19 NOTE — Telephone Encounter (Addendum)
Patient is scheduled for lab study on 03/03/19. Patient understands his sleep study will be done at [facility] sleep lab. Patient understands he will receive a sleep packet in a week or so. Patient understands to call if he does not receive the sleep packet in a timely manner. Patient agrees with treatment and thanked me for call . Days off Tuesday Wednesday Thursday.

## 2019-02-27 ENCOUNTER — Other Ambulatory Visit (HOSPITAL_COMMUNITY)
Admission: RE | Admit: 2019-02-27 | Discharge: 2019-02-27 | Disposition: A | Discharge status: Home / Self Care | Payer: No Typology Code available for payment source | Source: Ambulatory Visit | Attending: Interventional Cardiology | Admitting: Interventional Cardiology

## 2019-02-27 DIAGNOSIS — Z1159 Encounter for screening for other viral diseases: Secondary | ICD-10-CM | POA: Diagnosis present

## 2019-02-28 LAB — NOVEL CORONAVIRUS, NAA (HOSP ORDER, SEND-OUT TO REF LAB; TAT 18-24 HRS): SARS-CoV-2, NAA: NOT DETECTED

## 2019-03-03 ENCOUNTER — Other Ambulatory Visit: Payer: Self-pay

## 2019-03-03 ENCOUNTER — Ambulatory Visit (HOSPITAL_BASED_OUTPATIENT_CLINIC_OR_DEPARTMENT_OTHER): Payer: PRIVATE HEALTH INSURANCE | Attending: Interventional Cardiology | Admitting: Cardiovascular Disease

## 2019-03-03 VITALS — Ht 70.5 in | Wt 260.0 lb

## 2019-03-03 DIAGNOSIS — G4733 Obstructive sleep apnea (adult) (pediatric): Secondary | ICD-10-CM

## 2019-03-03 DIAGNOSIS — G4730 Sleep apnea, unspecified: Secondary | ICD-10-CM | POA: Diagnosis not present

## 2019-03-03 DIAGNOSIS — R0683 Snoring: Secondary | ICD-10-CM

## 2019-03-23 NOTE — Procedures (Signed)
Patient Name: [name], [name] Date: 03/03/2019 Gender: Male D.O.B: 08/02/1961 Age (years): 58 Referring Provider: Daneen Schick Height (inches): 71 Interpreting Physician: Fransico Him MD, ABSM Weight (lbs): 260 RPSGT: Gwenyth Allegra BMI: 37 MRN: [id number] Neck Size: 18.50  CLINICAL INFORMATION Sleep Study Type: Split Night CPAP  Indication for sleep study: Hypertension, OSA  Epworth Sleepiness Score: 7  SLEEP STUDY TECHNIQUE As per the AASM Manual for the Scoring of Sleep and Associated Events v2.3 (April 2016) with a hypopnea requiring 4% desaturations.  The channels recorded and monitored were frontal, central and occipital EEG, electrooculogram (EOG), submentalis EMG (chin), nasal and oral airflow, thoracic and abdominal wall motion, anterior tibialis EMG, snore microphone, electrocardiogram, and pulse oximetry. Continuous positive airway pressure (CPAP) was initiated when the patient met split night criteria and was titrated according to treat sleep-disordered breathing.  MEDICATIONS Medications self-administered by patient taken the night of the study : [name]  RESPIRATORY PARAMETERS Diagnostic Total AHI (/hr): 24.0  RDI (/hr):32.6  OA Index (/hr):4.7  CA Index (/hr): 0.4 REM AHI (/hr): 62.2  NREM AHI (/hr):19.9  Supine AHI (/hr):29.3  Non-supine AHI (/hr):9.6 Min O2 Sat (%):83.0  Mean O2 (%): 93.9  Time below 88% (min):2.2   Titration Optimal Pressure (cm):14  AHI at Optimal Pressure (/hr):0.0  Min O2 at Optimal Pressure (%):95.0 Supine % at Optimal (%):100  Sleep % at Optimal (%):99   SLEEP ARCHITECTURE The recording time for the entire night was 401 minutes.  During a baseline period of 154.5 minutes, the patient slept for 140.0 minutes in REM and nonREM, yielding a sleep efficiency of 90.6%. Sleep onset after lights out was 3.2 minutes with a REM latency of 72.0 minutes. The patient spent 12.5% of the night in stage N1 sleep, 77.9% in stage N2  sleep, 0.0% in stage N3 and 9.6% in REM.  During the titration period of 245.5 minutes, the patient slept for 88.0 minutes in REM and nonREM, yielding a sleep efficiency of 35.8%. Sleep onset after CPAP initiation was 129.2 minutes with a REM latency of 38.0 minutes. The patient spent 7.4% of the night in stage N1 sleep, 72.7% in stage N2 sleep, 0.0% in stage N3 and 19.9% in REM.  CARDIAC DATA The 2 lead EKG demonstrated sinus rhythm. The mean heart rate was 100.0 beats per minute. Other EKG findings include: PVCs.  LEG MOVEMENT DATA The total Periodic Limb Movements of Sleep (PLMS) were 0. The PLMS index was 0.0 .  IMPRESSIONS - Moderate obstructive sleep apnea occurred during the diagnostic portion of the study(AHI = 24.0/hour). An optimal PAP pressure was selected for this patient ( 14 cm of water) - No significant central sleep apnea occurred during the diagnostic portion of the study (CAI = 0.4/hour). - The patient had minimal or no oxygen desaturation during the diagnostic portion of the study (Min O2 = 83.0%) - The patient snored with loud snoring volume during the diagnostic portion of the study. - EKG findings include PVCs. - Clinically significant periodic limb movements did not occur during sleep.  DIAGNOSIS - Obstructive Sleep Apnea (327.23 [G47.33 ICD-10])  RECOMMENDATIONS - Trial of CPAP therapy on 14 cm H2O with a Medium size Fisher&Paykel Full Face Mask Simplus mask and heated humidification. - Avoid alcohol, sedatives and other CNS depressants that may worsen sleep apnea and disrupt normal sleep architecture. - Sleep hygiene should be reviewed to assess factors that may improve sleep quality. - Weight management and regular exercise should be initiated or continued. -  Return to Sleep Center for re-evaluation after 10 weeks of therapy  [Electronically signed] 03/23/2019 10:10 PM  Fransico Him MD, ABSM Diplomate, American Board of Sleep Medicine

## 2019-03-25 ENCOUNTER — Telehealth: Payer: Self-pay | Admitting: *Deleted

## 2019-03-25 NOTE — Telephone Encounter (Signed)
Informed patient of sleep study results and patient understanding was verbalized. Patient understands his sleep study showed they have significant sleep apnea and had successful PAP titration and will be set up with PAP unit.  Pt is aware and agreeable to these results and thanked me. Upon patient request DME selection is [facility]. Patient understands he will be contacted by [facility] to set up his cpap. Patient understands to call if [facility] does not contact him with new setup in a timely manner. Patient understands they will be called once confirmation has been received from [facility] that they have received their new machine to schedule 10 week follow up appointment.  [facility] notified of new cpap order  Please add to airview Patient was grateful for the call and thanked me.

## 2019-03-25 NOTE — Telephone Encounter (Signed)
-----   Message from Sueanne Margarita, MD sent at 03/23/2019 10:25 PM EDT ----- Please let patient know that they have significant sleep apnea and had successful PAP titration and will be set up with PAP unit.  Please let DME know that order is in EPIC.  Please set patient up for OV in 10 weeks

## 2019-04-30 NOTE — Telephone Encounter (Signed)
Patient has a 10 week follow up appointment scheduled for 06-29-19. Patient understands he needs to keep this appointment for insurance compliance. Patient was grateful for the call and thanked me.  

## 2019-05-19 ENCOUNTER — Telehealth: Payer: Self-pay | Admitting: Interventional Cardiology

## 2019-05-19 NOTE — Telephone Encounter (Signed)
Spoke with pt and he said he had scant amount of blood in urine.  Feels like he still needs to urinate after he is finished.  Denies burning sensation.  Advised pt to reach out to PCP in regards to these sx.  Pt appreciative for call.

## 2019-05-19 NOTE — Telephone Encounter (Signed)
New Message    Patient states he had blood in his urine and would like to speak to a nurse.

## 2019-05-19 NOTE — Telephone Encounter (Signed)
While on the phone, pt mentioned that his CPAP machine maxed on auto setting and he wakes up every morning with stomach pain and burps a lot.  Doesn't receive any relief until he gets all the gas out.  Advised I will forward to sleep team.

## 2019-05-20 ENCOUNTER — Ambulatory Visit
Admission: EM | Admit: 2019-05-20 | Discharge: 2019-05-20 | Disposition: A | Discharge status: Home / Self Care | Payer: PRIVATE HEALTH INSURANCE | Attending: Physician Assistant | Admitting: Physician Assistant

## 2019-05-20 DIAGNOSIS — R310 Gross hematuria: Secondary | ICD-10-CM

## 2019-05-20 LAB — POCT URINALYSIS DIP (MANUAL ENTRY)
Bilirubin, UA: NEGATIVE
Glucose, UA: NEGATIVE mg/dL
Ketones, POC UA: NEGATIVE mg/dL
Nitrite, UA: NEGATIVE
Protein Ur, POC: 100 mg/dL — AB
Spec Grav, UA: 1.025
Urobilinogen, UA: 1 U/dL
pH, UA: 6

## 2019-05-20 NOTE — ED Triage Notes (Signed)
Pt c/o blood in urine yesterday with an odor and has the urge to go a lot.

## 2019-05-20 NOTE — Discharge Instructions (Signed)
Your urine had trace bacteria, this is sent for urine culture, if shows urinary tract infection, we will call you for updated plan. Otherwise, keep hydrated, urine should be clear to pale yellow in color. Recheck your urine with PCP in 3-4 weeks to make sure blood in urine has resolved. If having worsening blood in urine, fever, back pain, nausea/vomiting, weakness, dizziness, go to the ED for further evaluation needed.

## 2019-05-20 NOTE — ED Provider Notes (Signed)
[facility]    CSN: [id number] Arrival date & time: 05/20/19  1053     History   Chief Complaint Chief Complaint  Patient presents with  . Urinary Tract Infection    HPI [name] is a 58 y.o. male.   58 year old male comes in for 2-day history of gross hematuria.  States noticed gross hematuria for 1-2 episodes, but has cleared up since then.  He had some urine odor as well.  He denies dysuria.  States he has urinary frequency at baseline due to the Lasix, however, no changes from baseline.  He recently started CPAP 3 weeks ago, which caused increased bloating and abdominal cramping.  States after he "passes all the gas", abdominal cramping resolves.  He is in the process of adjusting CPAP.  Denies nausea, vomiting.  Denies back/flank pain.  Denies painful bowel movement, painful sitting.  Denies testicular swelling/pain, penile discharge, penile pain, penile lesions.  Denies personal or family history of bladder cancer.     Past Medical History:  Diagnosis Date  . Arthritis    "knees" (06/28/2017)  . Chronic combined systolic and diastolic heart failure (HCC) 04/19/2017   a. LHC 04/22/17 - Normal coronary arteries; LVEDP 33 // Echo 04/21/17 - Mild LVH, EF 20-25, diff HK, restrictive physiology, mod to severe LAE, mild RVE, mild RAE, PASP 35.// Echo 11/18: Moderate concentric LVH, EF 35-40, diffuse HK, grade 1 diastolic dysfunction, mild LAE, mild RVE   . History of gout   . Migraine    "I had 1 in the 1990s" (06/28/2017)  . NICM (nonischemic cardiomyopathy) (HCC) 04/23/2017  . Obesity   . Sleep apnea 04/23/2017    Patient Active Problem List   Diagnosis Date Noted  . Enteritis 06/26/2017  . NICM (nonischemic cardiomyopathy) (HCC) 04/23/2017  . S/P cardiac cath, patent coronary arteries 04/22/17 04/23/2017  . Sleep apnea 04/23/2017  . Hypertension 04/20/2017  . Premature atrial beats 04/20/2017  . NSVT (nonsustained ventricular tachycardia) (HCC)   .  Chronic combined systolic and diastolic heart failure (HCC) 04/19/2017    Past Surgical History:  Procedure Laterality Date  . CARDIAC CATHETERIZATION  04/2017   patent coronary arteries/medical hx  . KNEE ARTHROSCOPY Left ~ 2008  . LEFT HEART CATH AND CORONARY ANGIOGRAPHY [name] 04/22/2017   Procedure: Left Heart Cath and Coronary Angiography;  Surgeon: Lyn RecordsSmith, Henry W, MD;  [facility];  Service: Cardiovascular;  Laterality: [name];       Home Medications    Prior to Admission medications   Medication Sig Start Date End Date Taking? Authorizing Provider  aspirin 81 MG chewable tablet Chew 1 tablet (81 mg total) by mouth daily. 04/24/17   Leone BrandIngold, Laura R, NP  atorvastatin (LIPITOR) 40 MG tablet TAKE 1 TABLET BY MOUTH ONCE DAILY AT  6  PM 01/13/19   Lyn RecordsSmith, Henry W, MD  furosemide (LASIX) 40 MG tablet Take 2 tablets by mouth twice daily 01/13/19   Lyn RecordsSmith, Henry W, MD  KLOR-CON 10 10 MEQ tablet TAKE 2 TABLETS BY MOUTH TWICE A DAY 10/07/17   Dunn, Tacey Ruizayna N, PA-C  lisinopril (PRINIVIL,ZESTRIL) 10 MG tablet TAKE 1 TABLET BY MOUTH ONCE DAILY 11/21/18   Lyn RecordsSmith, Henry W, MD  metoprolol succinate (TOPROL-XL) 50 MG 24 hr tablet TAKE 1 TABLET BY MOUTH ONCE DAILY (TAKE  WITH  OR  IMMEDIATELY  FOLLOWING  A  MEAL) 01/13/19   Lyn RecordsSmith, Henry W, MD  Multiple Vitamins-Minerals (MULTIVITAMIN ADULTS 50+ PO) Take 2 tablets  by mouth daily.    [provider]  spironolactone (ALDACTONE) 25 MG tablet Take 0.5 tablets (12.5 mg total) by mouth daily. 12/24/18   Tereso Newcomer T, PA-C  Turmeric 500 MG TABS Take 2 tablets by mouth daily.    [provider]    Family History History reviewed. No pertinent family history.  Social History Social History   Tobacco Use  . Smoking status: Never Smoker  . Smokeless tobacco: Never Used  Substance Use Topics  . Alcohol use: No  . Drug use: No     Allergies   Patient has no known allergies.   Review of Systems Review of Systems  Reason  unable to perform ROS: See HPI as above.     Physical Exam Triage Vital Signs ED Triage Vitals [05/20/19 1104]  Enc Vitals Group     BP 117/73     Pulse Rate (!) 52     Resp 18     Temp 98 F (36.7 C)     Temp Source Oral     SpO2 96 %     Weight      Height      Head Circumference      Peak Flow      Pain Score 0     Pain Loc      Pain Edu?      Excl. in GC?    No data found.  Updated Vital Signs BP 117/73 (BP Location: Left Arm)   Pulse (!) 52   Temp 98 F (36.7 C) (Oral)   Resp 18   SpO2 96%   Physical Exam Constitutional:      General: He is not in acute distress.    Appearance: He is well-developed.  HENT:     Head: Normocephalic and atraumatic.  Cardiovascular:     Rate and Rhythm: Normal rate and regular rhythm.     Heart sounds: Normal heart sounds. No murmur. No friction rub. No gallop.   Pulmonary:     Effort: Pulmonary effort is normal.     Breath sounds: Normal breath sounds. No wheezing or rales.  Abdominal:     General: Bowel sounds are normal.     Palpations: Abdomen is soft.     Tenderness: There is no abdominal tenderness. There is no right CVA tenderness, left CVA tenderness, guarding or rebound.  Skin:    General: Skin is warm and dry.  Neurological:     Mental Status: He is alert and oriented to person, place, and time.  Psychiatric:        Behavior: Behavior normal.        Judgment: Judgment normal.      UC Treatments / Results  Labs (all labs ordered are listed, but only abnormal results are displayed) Labs Reviewed  POCT URINALYSIS DIP (MANUAL ENTRY) - Abnormal; Notable for the following components:      Result Value   Clarity, UA cloudy (*)    Blood, UA trace-lysed (*)    Protein Ur, POC =100 (*)    Leukocytes, UA Trace (*)    All other components within normal limits  URINE CULTURE    EKG   Radiology No results found.  Procedures Procedures (including critical care time)  Medications Ordered in UC  Medications - No data to display  Initial Impression / Assessment and Plan / UC Course  I have reviewed the triage vital signs and the nursing notes.  Pertinent labs & imaging results that  were available during my care of the patient were reviewed by me and considered in my medical decision making (see chart for details).    Urine with trace leukocytes and trace blood.  Will send for urine culture to assess for urinary tract infection.  Discussed possible kidney stones causing symptoms as well.  Patient to push fluids at this time, and continue to monitor symptoms.  Patient to follow-up with PCP for urine recheck in 3 to 4 weeks to ensure resolution of hematuria.  Return precautions given.  Patient expresses understanding and agrees to plan.  Patient without PCP at this time, appointment made to establish care in 1 week with primary care at [facility].   Final Clinical Impressions(s) / UC Diagnoses   Final diagnoses:  Gross hematuria   ED Prescriptions    None        Ok Edwards, PA-C 05/20/19 1158

## 2019-05-21 LAB — URINE CULTURE: Culture: NO GROWTH

## 2019-05-21 NOTE — Telephone Encounter (Signed)
His last download showed his AHI was too high.  Please place on auto CPAP from 4 to 20cm H2O and get a download in 2 weeks and call to let us know how he is doing

## 2019-05-21 NOTE — Telephone Encounter (Signed)
Loren Racer, RN 2 days ago     While on the phone, pt mentioned that his CPAP machine maxed on auto setting and he wakes up every morning with stomach pain and burps a lot.  Doesn't receive any relief until he gets all the gas out.  Advised I will forward to sleep team.

## 2019-05-22 NOTE — Telephone Encounter (Signed)
[facility] has changed the pressures via the modem to place on auto CPAP from 4 to 20cm H2O and get a download in 2 weeks and call to let us know how he is doing.

## 2019-05-25 ENCOUNTER — Telehealth: Payer: Self-pay

## 2019-05-25 NOTE — Telephone Encounter (Signed)
Appointment changed to Tuesday 9/29 at 9:00 am due to provider schedule change. I will notify patient.

## 2019-05-25 NOTE — Telephone Encounter (Signed)
Called patient to do their pre-visit COVID screening.  Call went to voicemail. Unable to do prescreening.  

## 2019-05-26 ENCOUNTER — Other Ambulatory Visit: Payer: Self-pay

## 2019-05-26 ENCOUNTER — Encounter: Payer: Self-pay | Admitting: Internal Medicine

## 2019-05-26 ENCOUNTER — Ambulatory Visit (INDEPENDENT_AMBULATORY_CARE_PROVIDER_SITE_OTHER): Payer: PRIVATE HEALTH INSURANCE | Admitting: Internal Medicine

## 2019-05-26 ENCOUNTER — Inpatient Hospital Stay: Payer: No Typology Code available for payment source | Admitting: Internal Medicine

## 2019-05-26 VITALS — BP 120/75 | HR 50 | Temp 97.3°F | Resp 17 | Ht 70.0 in | Wt 259.4 lb

## 2019-05-26 DIAGNOSIS — Z9989 Dependence on other enabling machines and devices: Secondary | ICD-10-CM

## 2019-05-26 DIAGNOSIS — E6609 Other obesity due to excess calories: Secondary | ICD-10-CM | POA: Diagnosis not present

## 2019-05-26 DIAGNOSIS — R7303 Prediabetes: Secondary | ICD-10-CM

## 2019-05-26 DIAGNOSIS — G4733 Obstructive sleep apnea (adult) (pediatric): Secondary | ICD-10-CM

## 2019-05-26 DIAGNOSIS — E66812 Obesity, class 2: Secondary | ICD-10-CM

## 2019-05-26 DIAGNOSIS — Z6837 Body mass index (BMI) 37.0-37.9, adult: Secondary | ICD-10-CM | POA: Diagnosis not present

## 2019-05-26 DIAGNOSIS — R310 Gross hematuria: Secondary | ICD-10-CM | POA: Diagnosis not present

## 2019-05-26 DIAGNOSIS — I5042 Chronic combined systolic (congestive) and diastolic (congestive) heart failure: Secondary | ICD-10-CM

## 2019-05-26 NOTE — Progress Notes (Signed)
States that hematuria has resolved. No pain with urination, frequency, urgency, back pain, nausea

## 2019-05-26 NOTE — Patient Instructions (Signed)
Prediabetes Eating Plan Prediabetes is a condition that causes blood sugar (glucose) levels to be higher than normal. This increases the risk for developing diabetes. In order to prevent diabetes from developing, your health care provider may recommend a diet and other lifestyle changes to help you:  Control your blood glucose levels.  Improve your cholesterol levels.  Manage your blood pressure. Your health care provider may recommend working with a diet and nutrition specialist (dietitian) to make a meal plan that is best for you. What are tips for following this plan? Lifestyle  Set weight loss goals with the help of your health care team. It is recommended that most people with prediabetes lose 7% of their current body weight.  Exercise for at least 30 minutes at least 5 days a week.  Attend a support group or seek ongoing support from a mental health counselor.  Take over-the-counter and prescription medicines only as told by your health care provider. Reading food labels  Read food labels to check the amount of fat, salt (sodium), and sugar in prepackaged foods. Avoid foods that have: ? Saturated fats. ? Trans fats. ? Added sugars.  Avoid foods that have more than 300 milligrams (mg) of sodium per serving. Limit your daily sodium intake to less than 2,300 mg each day. Shopping  Avoid buying pre-made and processed foods. Cooking  Cook with olive oil. Do not use butter, lard, or ghee.  Bake, broil, grill, or boil foods. Avoid frying. Meal planning   Work with your dietitian to develop an eating plan that is right for you. This may include: ? Tracking how many calories you take in. Use a food diary, notebook, or mobile application to track what you eat at each meal. ? Using the glycemic index (GI) to plan your meals. The index tells you how quickly a food will raise your blood glucose. Choose low-GI foods. These foods take a longer time to raise blood glucose.  Consider  following a Mediterranean diet. This diet includes: ? Several servings each day of fresh fruits and vegetables. ? Eating fish at least twice a week. ? Several servings each day of whole grains, beans, nuts, and seeds. ? Using olive oil instead of other fats. ? Moderate alcohol consumption. ? Eating small amounts of red meat and whole-fat dairy.  If you have high blood pressure, you may need to limit your sodium intake or follow a diet such as the DASH eating plan. DASH is an eating plan that aims to lower high blood pressure. What foods are recommended? The items listed below may not be a complete list. Talk with your dietitian about what dietary choices are best for you. Grains Whole grains, such as whole-wheat or whole-grain breads, crackers, cereals, and pasta. Unsweetened oatmeal. Bulgur. Barley. Quinoa. Brown rice. Corn or whole-wheat flour tortillas or taco shells. Vegetables Lettuce. Spinach. Peas. Beets. Cauliflower. Cabbage. Broccoli. Carrots. Tomatoes. Squash. Eggplant. Herbs. Peppers. Onions. Cucumbers. Brussels sprouts. Fruits Berries. Bananas. Apples. Oranges. Grapes. Papaya. Mango. Pomegranate. Kiwi. Grapefruit. Cherries. Meats and other protein foods Seafood. Poultry without skin. Lean cuts of pork and beef. Tofu. Eggs. Nuts. Beans. Dairy Low-fat or fat-free dairy products, such as yogurt, cottage cheese, and cheese. Beverages Water. Tea. Coffee. Sugar-free or diet soda. Seltzer water. Lowfat or no-fat milk. Milk alternatives, such as soy or almond milk. Fats and oils Olive oil. Canola oil. Sunflower oil. Grapeseed oil. Avocado. Walnuts. Sweets and desserts Sugar-free or low-fat pudding. Sugar-free or low-fat ice cream and other frozen treats.   Seasoning and other foods Herbs. Sodium-free spices. Mustard. Relish. Low-fat, low-sugar ketchup. Low-fat, low-sugar barbecue sauce. Low-fat or fat-free mayonnaise. What foods are not recommended? The items listed below may not be a  complete list. Talk with your dietitian about what dietary choices are best for you. Grains Refined white flour and flour products, such as bread, pasta, snack foods, and cereals. Vegetables Canned vegetables. Frozen vegetables with butter or cream sauce. Fruits Fruits canned with syrup. Meats and other protein foods Fatty cuts of meat. Poultry with skin. Breaded or fried meat. Processed meats. Dairy Full-fat yogurt, cheese, or milk. Beverages Sweetened drinks, such as sweet iced tea and soda. Fats and oils Butter. Lard. Ghee. Sweets and desserts Baked goods, such as cake, cupcakes, pastries, cookies, and cheesecake. Seasoning and other foods Spice mixes with added salt. Ketchup. Barbecue sauce. Mayonnaise. Summary  To prevent diabetes from developing, you may need to make diet and other lifestyle changes to help control blood sugar, improve cholesterol levels, and manage your blood pressure.  Set weight loss goals with the help of your health care team. It is recommended that most people with prediabetes lose 7 percent of their current body weight.  Consider following a Mediterranean diet that includes plenty of fresh fruits and vegetables, whole grains, beans, nuts, seeds, fish, lean meat, low-fat dairy, and healthy oils. This information is not intended to replace advice given to you by your health care provider. Make sure you discuss any questions you have with your health care provider. Document Released: 02/08/2015 Document Revised: 01/16/2019 Document Reviewed: 11/28/2016 Elsevier Patient Education  2020 Elsevier Inc.   Preventing Type 2 Diabetes Mellitus Type 2 diabetes (type 2 diabetes mellitus) is a long-term (chronic) disease that affects blood sugar (glucose) levels. Normally, a hormone called insulin allows glucose to enter cells in the body. The cells use glucose for energy. In type 2 diabetes, one or both of these problems may be present:  The body does not make  enough insulin.  The body does not respond properly to insulin that it makes (insulin resistance). Insulin resistance or lack of insulin causes excess glucose to build up in the blood instead of going into cells. As a result, high blood glucose (hyperglycemia) develops, which can cause many complications. Being overweight or obese and having an inactive (sedentary) lifestyle can increase your risk for diabetes. Type 2 diabetes can be delayed or prevented by making certain nutrition and lifestyle changes. What nutrition changes can be made?   Eat healthy meals and snacks regularly. Keep a healthy snack with you for when you get hungry between meals, such as fruit or a handful of nuts.  Eat lean meats and proteins that are low in saturated fats, such as chicken, fish, egg whites, and beans. Avoid processed meats.  Eat plenty of fruits and vegetables and plenty of grains that have not been processed (whole grains). It is recommended that you eat: ? 1?2 cups of fruit every day. ? 2?3 cups of vegetables every day. ? 6?8 oz of whole grains every day, such as oats, whole wheat, bulgur, brown rice, quinoa, and millet.  Eat low-fat dairy products, such as milk, yogurt, and cheese.  Eat foods that contain healthy fats, such as nuts, avocado, olive oil, and canola oil.  Drink water throughout the day. Avoid drinks that contain added sugar, such as soda or sweet tea.  Follow instructions from your health care provider about specific eating or drinking restrictions.  Control how much food you eat  at a time (portion size). ? Check food labels to find out the serving sizes of foods. ? Use a kitchen scale to weigh amounts of foods.  Saute or steam food instead of frying it. Cook with water or broth instead of oils or butter.  Limit your intake of: ? Salt (sodium). Have no more than 1 tsp (2,400 mg) of sodium a day. If you have heart disease or high blood pressure, have less than ? tsp (1,500 mg)  of sodium a day. ? Saturated fat. This is fat that is solid at room temperature, such as butter or fat on meat. What lifestyle changes can be made? Activity   Do moderate-intensity physical activity for at least 30 minutes on at least 5 days of the week, or as much as told by your health care provider.  Ask your health care provider what activities are safe for you. A mix of physical activities may be best, such as walking, swimming, cycling, and strength training.  Try to add physical activity into your day. For example: ? Park in spots that are farther away than usual, so that you walk more. For example, park in a far corner of the parking lot when you go to the office or the grocery store. ? Take a walk during your lunch break. ? Use stairs instead of elevators or escalators. Weight Loss  Lose weight as directed. Your health care provider can determine how much weight loss is best for you and can help you lose weight safely.  If you are overweight or obese, you may be instructed to lose at least 5?7 % of your body weight. Alcohol and Tobacco   Limit alcohol intake to no more than 1 drink a day for nonpregnant women and 2 drinks a day for men. One drink equals 12 oz of beer, 5 oz of wine, or 1 oz of hard liquor.  Do not use any tobacco products, such as cigarettes, chewing tobacco, and e-cigarettes. If you need help quitting, ask your health care provider. Work With [place] Provider  Have your blood glucose tested regularly, as told by your health care provider.  Discuss your risk factors and how you can reduce your risk for diabetes.  Get screening tests as told by your health care provider. You may have screening tests regularly, especially if you have certain risk factors for type 2 diabetes.  Make an appointment with a diet and nutrition specialist (registered dietitian). A registered dietitian can help you make a healthy eating plan and can help you understand  portion sizes and food labels. Why are these changes important?  It is possible to prevent or delay type 2 diabetes and related health problems by making lifestyle and nutrition changes.  It can be difficult to recognize signs of type 2 diabetes. The best way to avoid possible damage to your body is to take actions to prevent the disease before you develop symptoms. What can happen if changes are not made?  Your blood glucose levels may keep increasing. Having high blood glucose for a long time is dangerous. Too much glucose in your blood can damage your blood vessels, heart, kidneys, nerves, and eyes.  You may develop prediabetes or type 2 diabetes. Type 2 diabetes can lead to many chronic health problems and complications, such as: ? Heart disease. ? Stroke. ? Blindness. ? Kidney disease. ? Depression. ? Poor circulation in the feet and legs, which could lead to surgical removal (amputation) in severe cases.  Where to find support  Ask your health care provider to recommend a registered dietitian, diabetes educator, or weight loss program.  Look for local or online weight loss groups.  Join a gym, fitness club, or outdoor activity group, such as a walking club. Where to find more information To learn more about diabetes and diabetes prevention, visit:  American Diabetes Association (ADA): www.diabetes.[employer] of Diabetes and Digestive and Kidney Diseases: ToyArticles.ca To learn more about healthy eating, visit:  The U.S. Department of Agriculture Architect), Choose My Plate: http://yates.biz/  Office of Disease Prevention and Health Promotion (ODPHP), Dietary Guidelines: ListingMagazine.si Summary  You can reduce your risk for type 2 diabetes by increasing your physical activity, eating healthy foods, and losing weight as directed.  Talk with your health care provider about your risk for type 2 diabetes.  Ask about any blood tests or screening tests that you need to have. This information is not intended to replace advice given to you by your health care provider. Make sure you discuss any questions you have with your health care provider. Document Released: 01/16/2016 Document Revised: 01/16/2019 Document Reviewed: 11/15/2015 Elsevier Patient Education  2020 [employer].

## 2019-05-26 NOTE — Progress Notes (Signed)
Patient ID: Robert CrockerJohnny R Snow, male    DOB: 28-Jan-1961  MRN: [id number]  CC: Establish Care and Follow-up (from recent urgent care follow up)   Subjective: Robert SnareJohnny Lo is a 58 y.o. male who presents for new pt visit f/u hematuria His concerns today include:  Patient with history of OSA, obesity, NICM, chronic combined systolic and diastolic CHF, prediabetes, migraines  Patient does not have a PCP.  He was followed mainly by his cardiologist Dr. Katrinka BlazingSmith.  Hematuria: Patient seen at urgent care recently with history of gross painless hematuria for 2 days.  No dysuria.  No previous episodes.  Urine dipstick revealed trace blood and leukocytes.  Urine culture was negative.  He has not had any further episodes.  OSA: Recently diagnosed with OSA.  Tolerating CPAP  CHF:  No SOB, PND, orthopnea.  No LE edema Limits salt  Reports compliance with medications as listed on his med list today.   PreDM: A1c done by cardiology 6 months ago was 5.7.  Previous level was 5.8.  Patient states that he was not made aware that he was in the range for prediabetes.   No fhx DM No blurred vision, no polydipsia "I do fine" with eating habits Does a lot of walking at work.  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   Patient Active Problem List   Diagnosis Date Noted  . Enteritis 06/26/2017  . NICM (nonischemic cardiomyopathy) (HCC) 04/23/2017  . S/P cardiac cath, patent coronary arteries 04/22/17 04/23/2017  . Sleep apnea 04/23/2017  . Hypertension 04/20/2017  . Premature atrial beats 04/20/2017  . NSVT (nonsustained ventricular tachycardia) (HCC)   . Chronic combined systolic and diastolic heart failure (HCC) 04/19/2017     Current Outpatient Medications on File Prior to Visit  Medication Sig Dispense Refill  . aspirin 81 MG chewable tablet Chew 1 tablet (81 mg total) by mouth daily.    [name]. atorvastatin (LIPITOR) 40 MG  tablet TAKE 1 TABLET BY MOUTH ONCE DAILY AT  6  PM 90 tablet 1  . furosemide (LASIX) 40 MG tablet Take 2 tablets by mouth twice daily 120 tablet 2  . lisinopril (PRINIVIL,ZESTRIL) 10 MG tablet TAKE 1 TABLET BY MOUTH ONCE DAILY 90 tablet 3  . metoprolol succinate (TOPROL-XL) 50 MG 24 hr tablet TAKE 1 TABLET BY MOUTH ONCE DAILY (TAKE  WITH  OR  IMMEDIATELY  FOLLOWING  A  MEAL) 90 tablet 1  . Multiple Vitamins-Minerals (MULTIVITAMIN ADULTS 50+ PO) Take 2 tablets by mouth daily.    . potassium chloride (K-DUR) 10 MEQ tablet Take 20 mEq by mouth 2 (two) times daily.    [name]. spironolactone (ALDACTONE) 25 MG tablet Take 0.5 tablets (12.5 mg total) by mouth daily. 45 tablet 3  . Turmeric 500 MG TABS Take 2 tablets by mouth daily.     No current facility-administered medications on file prior to visit.     No Known Allergies  Social History   Socioeconomic History  . Marital status: Married    Spouse name: Not on file  . Number of children: Not on file  . Years of education: Not on file  . Highest education level: Not on file  Occupational History  . Not on file  Social Needs  . Financial resource strain: Not on file  . Food insecurity    Worry: Not on file    Inability: Not on file  . Transportation needs  Medical: Not on file    Non-medical: Not on file  Tobacco Use  . Smoking status: Never Smoker  . Smokeless tobacco: Never Used  Substance and Sexual Activity  . Alcohol use: No  . Drug use: No  . Sexual activity: Not Currently  Lifestyle  . Physical activity    Days per week: Not on file    Minutes per session: Not on file  . Stress: Not on file  Relationships  . Social Herbalist on phone: Not on file    Gets together: Not on file    Attends religious service: Not on file    Active member of club or organization: Not on file    Attends meetings of clubs or organizations: Not on file    Relationship status: Not on file  . Intimate partner violence    Fear of  current or ex partner: Not on file    Emotionally abused: Not on file    Physically abused: Not on file    Forced sexual activity: Not on file  Other Topics Concern  . Not on file  Social History Narrative  . Not on file    No family history on file.  Past Surgical History:  Procedure Laterality Date  . CARDIAC CATHETERIZATION  04/2017   patent coronary arteries/medical hx  . KNEE ARTHROSCOPY Left ~ 2008  . LEFT HEART CATH AND CORONARY ANGIOGRAPHY [name] 04/22/2017   Procedure: Left Heart Cath and Coronary Angiography;  Surgeon: Belva Crome, MD;  Location: [facility];  Service: Cardiovascular;  Laterality: [name];    ROS: Review of Systems Negative except as stated above  PHYSICAL EXAM: BP 120/75   Pulse (!) 50   Temp (!) 97.3 F (36.3 C) (Temporal)   Resp 17   Ht 5\' 10"  (1.778 m)   Wt 259 lb 6.4 oz (117.7 kg)   SpO2 98%   BMI 37.22 kg/m   Physical Exam  General appearance - alert, well appearing, middle-aged African-American male and in no distress Mental status - normal mood, behavior, speech, dress, motor activity, and thought processes Neck - supple, no significant adenopathy Chest - clear to auscultation, no wheezes, rales or rhonchi, symmetric air entry Heart - normal rate, regular rhythm, normal S1, S2, no murmurs, rubs, clicks or gallops Abdomen - soft, nontender, nondistended, no masses or organomegaly Extremities -no lower extremity edema  CMP Latest Ref Rng & Units 11/18/2018 12/10/2017 08/08/2017  Glucose 65 - 99 mg/dL 100(H) 107(H) 89  BUN 6 - 24 mg/dL 23 21 10   Creatinine 0.76 - 1.27 mg/dL 1.13 0.87 1.04  Sodium 134 - 144 mmol/L 138 140 140  Potassium 3.5 - 5.2 mmol/L 4.6 4.2 4.3  Chloride 96 - 106 mmol/L 95(L) 101 101  CO2 20 - 29 mmol/L 25 25 24   Calcium 8.7 - 10.2 mg/dL 9.9 9.6 9.5  Total Protein 6.0 - 8.5 g/dL 7.6 - -  Total Bilirubin 0.0 - 1.2 mg/dL 0.6 - -  Alkaline Phos 39 - 117 IU/L 91 - -  AST 0 - 40 IU/L 23 - -  ALT 0 - 44 IU/L 21 - -    Lipid Panel     Component Value Date/Time   CHOL 146 11/18/2018 0753   TRIG 71 11/18/2018 0753   HDL 47 11/18/2018 0753   CHOLHDL 3.1 11/18/2018 0753   CHOLHDL 3.9 04/20/2017 0224   VLDL 18 04/20/2017 0224   LDLCALC 85 11/18/2018 0753  CBC    Component Value Date/Time   WBC 6.8 06/27/2017 0406   RBC 4.39 06/27/2017 0406   HGB 13.0 06/27/2017 0406   HCT 39.1 06/27/2017 0406   PLT 161 06/27/2017 0406   MCV 89.1 06/27/2017 0406   MCH 29.6 06/27/2017 0406   MCHC 33.2 06/27/2017 0406   RDW 12.7 06/27/2017 0406    ASSESSMENT AND PLAN: 1. Gross hematuria Will refer for CT urography and to urology once resulted.  Patient states that he will be starting a new job the middle of next week and will not have insurance for the first 90 days.  He is unable to afford Cobra. We will try to get the CT scan done before he loses insurance the middle part of next week. I advised him to apply for the orange card/cone discount card so that we can refer to urology using that once we get the results of the CT - Urinalysis  2. Prediabetes Discussed the importance of healthy eating habits and regular exercise.  Printed information given on prediabetes  3. Class 2 obesity due to excess calories without serious comorbidity with body mass index (BMI) of 37.0 to 37.9 in adult See #2 above  4. OSA on CPAP Continue using and being compliant with CPAP  5. Chronic combined systolic and diastolic heart failure (HCC) Followed by cardiology.  On clinical exam he is not fluid overloaded.  Advised to continue his current medications     Patient was given the opportunity to ask questions.  Patient verbalized understanding of the plan and was able to repeat key elements of the plan.   Orders Placed This Encounter  Procedures  . Urinalysis  . Ambulatory referral to Urology     Requested Prescriptions    No prescriptions requested or ordered in this encounter    Return in about 6 weeks (around  07/07/2019).  Jonah Blue, MD, FACP

## 2019-05-27 ENCOUNTER — Telehealth: Payer: Self-pay

## 2019-05-27 LAB — URINALYSIS
Bilirubin, UA: NEGATIVE
Glucose, UA: NEGATIVE
Ketones, UA: NEGATIVE
Nitrite, UA: NEGATIVE
Protein,UA: NEGATIVE
RBC, UA: NEGATIVE
Specific Gravity, UA: 1.009 (ref 1.005–1.030)
Urobilinogen, Ur: 0.2 mg/dL (ref 0.2–1.0)
pH, UA: 7 (ref 5.0–7.5)

## 2019-05-27 NOTE — Telephone Encounter (Signed)
Received multiple communication errors. Called PA line back to see if there was an alternate fax line. Was told to send it to [phone].

## 2019-05-27 NOTE — Telephone Encounter (Signed)
Called [phone] to initiate a prior authorization for CPT [phone]. Spoke with Consuela. Case # [id number]. She states that clinical information is required. Need to fax office notes to [phone].  She states that the standard turnaround time for the prior authorization is 7-10 days.

## 2019-05-27 NOTE — Telephone Encounter (Signed)
-----   Message from Ladell Pier, MD sent at 05/26/2019  9:33 PM EDT ----- This pt needs CT scan of abdomen and pelvis with and without contrast to eval history of gross hematuria.  He told me today that he will no longer have insurance as of next Wednesday.  So can we try and get this scheduled before then?  He will also need basic metabolic panel prior to the CT.  I forgot to order today.  Once CT is ordered, have him return to lab to have this done.  I have entered the order.

## 2019-05-27 NOTE — Progress Notes (Signed)
Patient notified of results & recommendations. Expressed understanding.

## 2019-05-27 NOTE — Telephone Encounter (Signed)
Patient-Called but could not leave a message.  Donna-Called but could not leave a message. Per dpr called son's phone LM for patient to call back.

## 2019-05-27 NOTE — Telephone Encounter (Signed)
Notes faxed to [phone]. Awaiting determination.

## 2019-05-29 NOTE — Telephone Encounter (Signed)
Was able to fax clinical information while at [facility] 05/28/2019. Did receive confirmation.

## 2019-05-31 ENCOUNTER — Other Ambulatory Visit: Payer: Self-pay | Admitting: Interventional Cardiology

## 2019-06-04 NOTE — Addendum Note (Signed)
Addended by: Karle Plumber B on: 06/04/2019 09:39 PM   Modules accepted: Orders

## 2019-06-04 NOTE — Telephone Encounter (Signed)
Received notification that prior authorization was approved for CT. Authorization # [id number]. Valid 05/27/2019-08/27/2019.  Called Central Scheduling([phone]) to get appointment for patient. Spoke with Ginger. She states that this exam is usually done with contrast & so she wanted me to clarify the order before I could schedule appointment.

## 2019-06-05 NOTE — Telephone Encounter (Signed)
Called PA [phone]) to see if the case could be modified to reflect CPT [phone] or if I would have to open a new prior authorization request. Spoke with [place]. She states that she has sent the request to update CPT code to the one of the clinical nurses & they will fax the new determination to the office fax.

## 2019-06-16 IMAGING — CT CT RENAL STONE PROTOCOL
2 of 4 series · 17 of 46 positions shown, 19 images · non-contrast
Comparison: None.

CLINICAL DATA: Upper abdominal pain radiating to the chest.

EXAM:
CT ABDOMEN AND PELVIS WITHOUT CONTRAST
TECHNIQUE: Multidetector CT imaging of the abdomen and pelvis was performed
following the standard protocol without IV contrast.

[Series 3: renal stone 5.0 · axial · 0.97mm/px · z∈[-438,+2]mm · 14 of 97 slices shown, 16 images]
[im 5/97  soft-tissue]
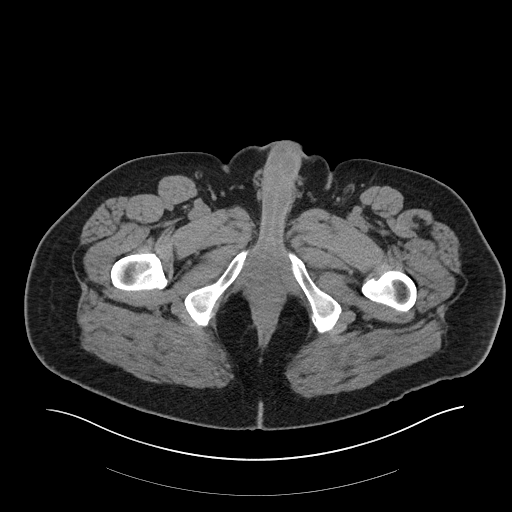
[im 5/97  bone]
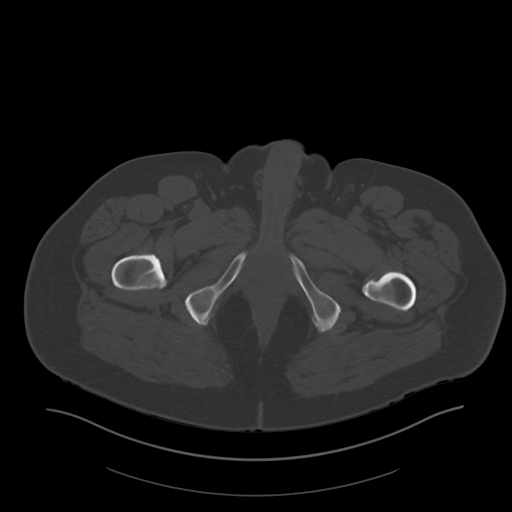
[im 13/97  soft-tissue]
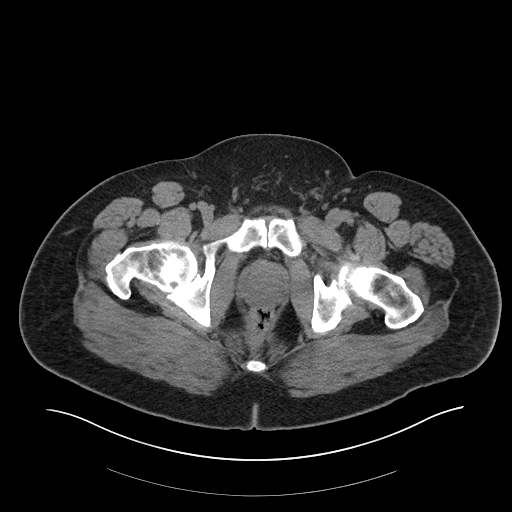
[im 21/97  soft-tissue]
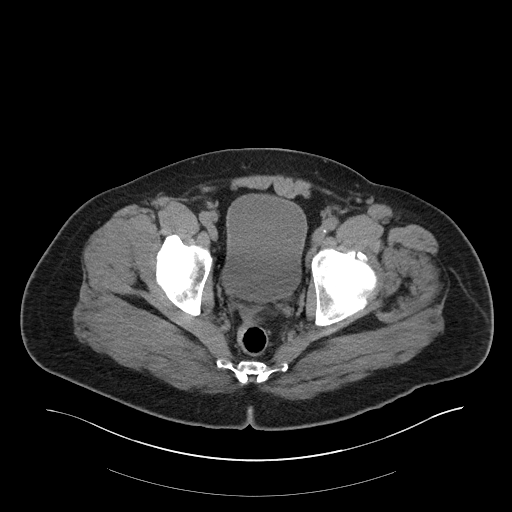
[im 25/97  soft-tissue]
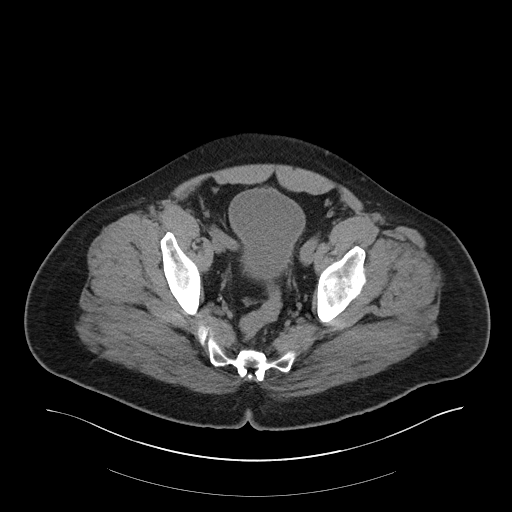
[im 33/97  soft-tissue]
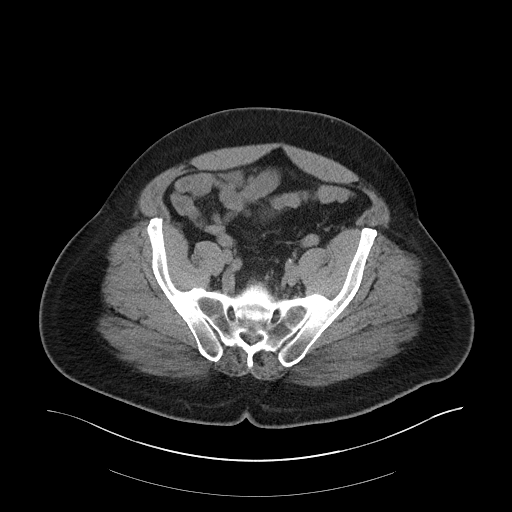
[im 41/97  soft-tissue]
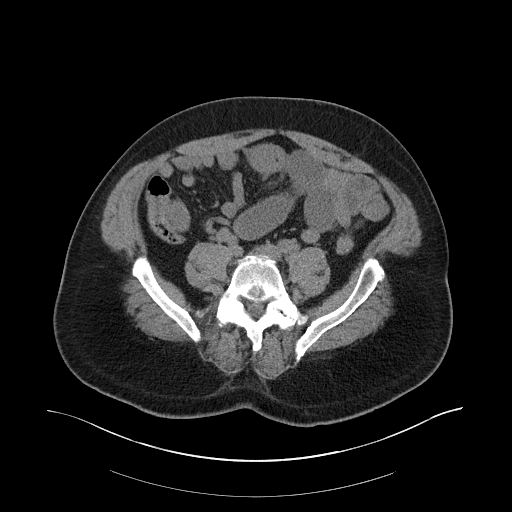
[im 45/97  soft-tissue]
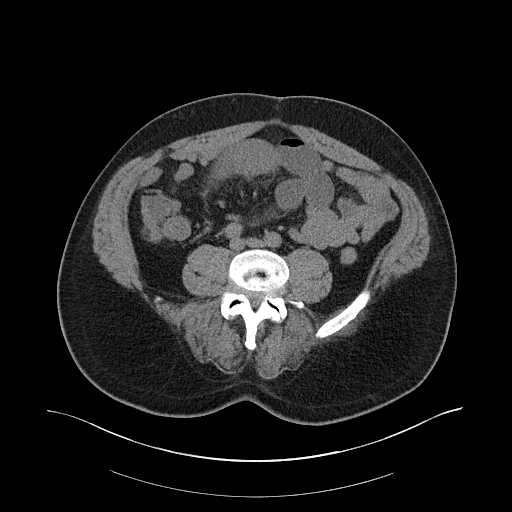
[im 53/97  soft-tissue]
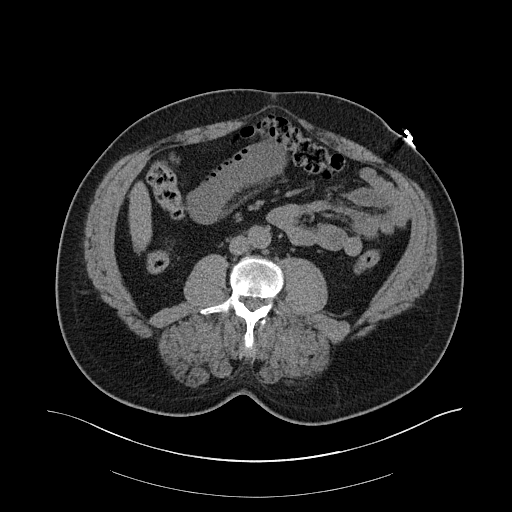
[im 57/97  soft-tissue]
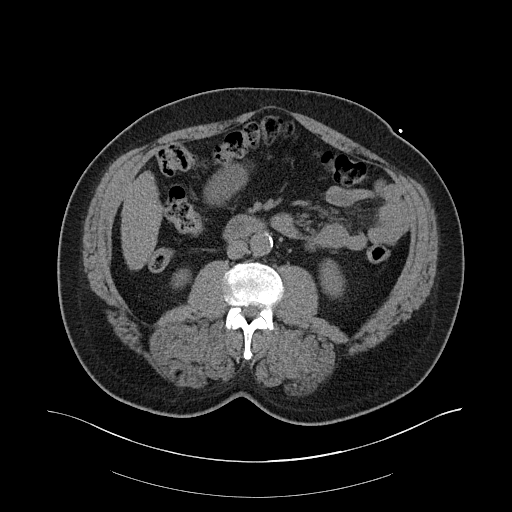
[im 57/97  bone]
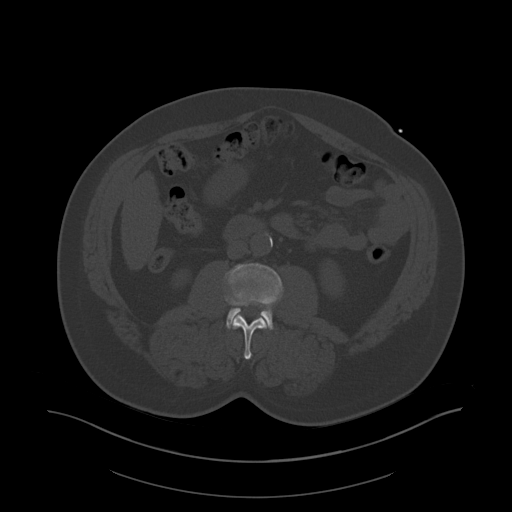
[im 65/97  soft-tissue]
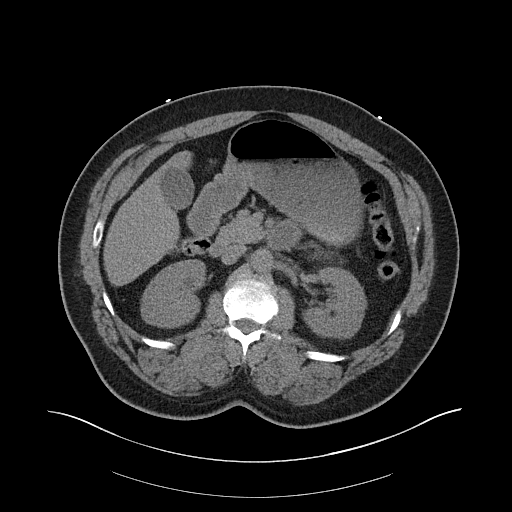
[im 73/97  soft-tissue]
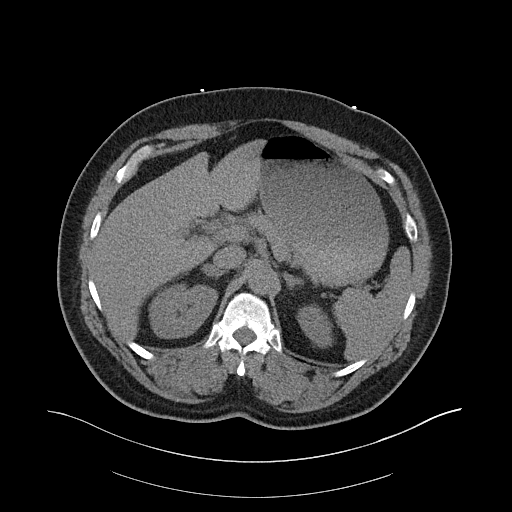
[im 77/97  soft-tissue]
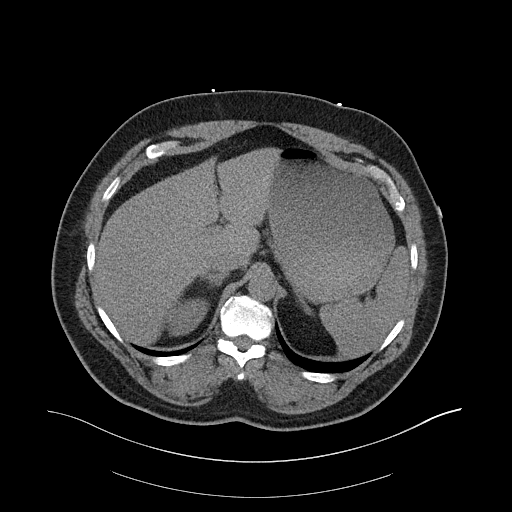
[im 85/97  soft-tissue]
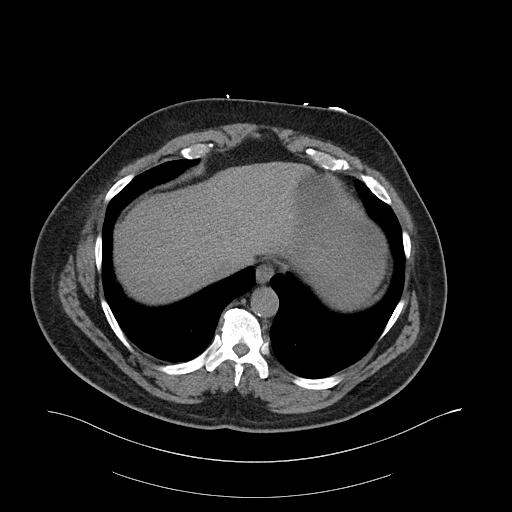
[im 93/97  soft-tissue]
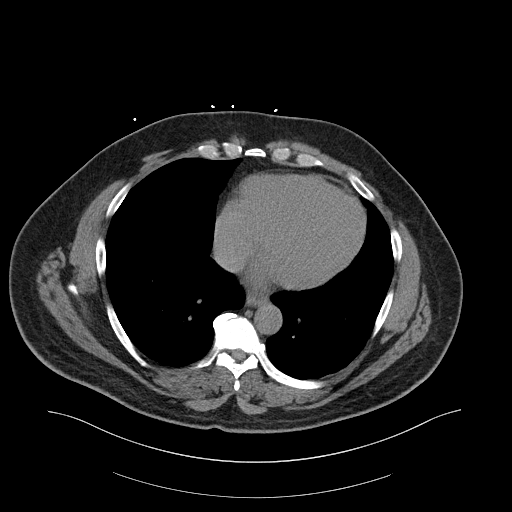

[Series 6: renal stone 3.0 cor · coronal · 0.90mm/px · 3 of 104 slices shown]
[im 35/104  soft-tissue]
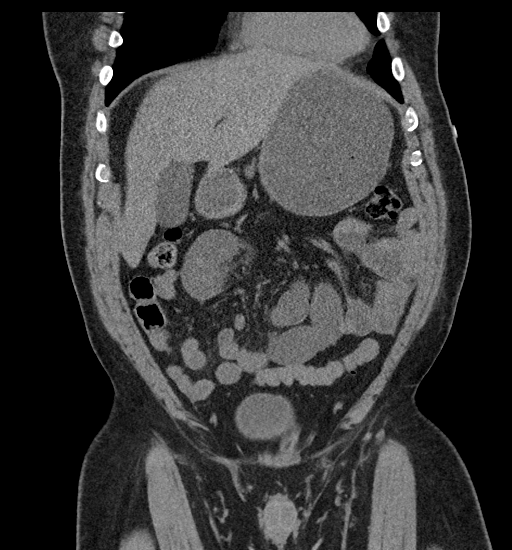
[im 46/104  soft-tissue]
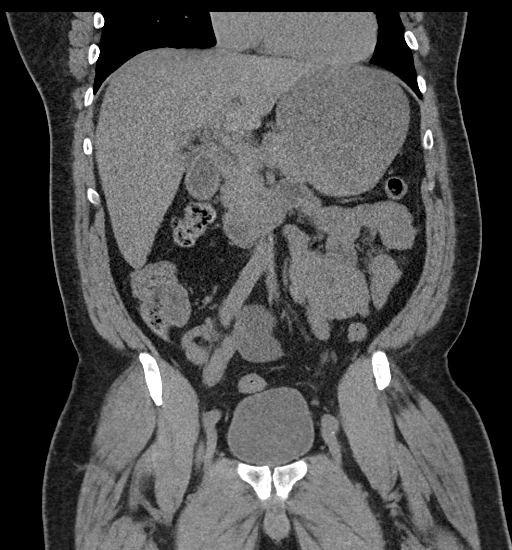
[im 58/104  soft-tissue]
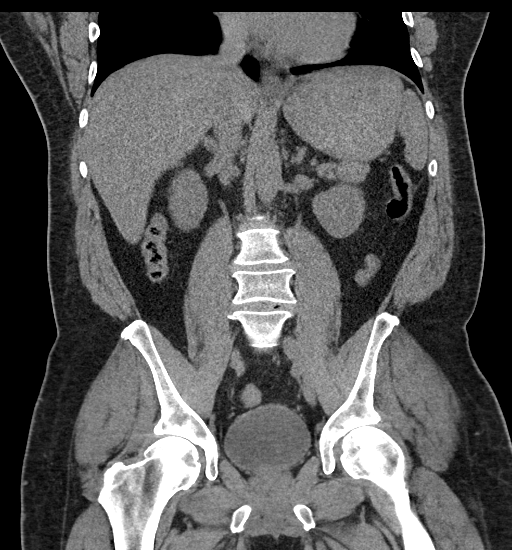

[17 of 46 positions shown; findings below may reference images not displayed]

FINDINGS: Lower chest: The lung bases are clear.

Hepatobiliary: No focal liver abnormality is seen. No gallstones,
gallbladder wall thickening, or biliary dilatation.

Pancreas: Unremarkable. No pancreatic ductal dilatation or
surrounding inflammatory changes.

Spleen: Normal in size without focal abnormality.

Adrenals/Urinary Tract: No adrenal gland nodules. Kidneys are
symmetrical in size. subcentimeter exophytic lesion from the right
kidney likely represents a cyst. No hydronephrosis or hydroureter.
No renal, ureteral, or bladder stones. No bladder wall thickening.

Stomach/Bowel: Stomach is unremarkable. Mildly dilated fluid-filled
mid abdominal small bowel with mild mesenteric edema. This could
represent early obstruction or enteritis. Colon is decompressed and
is diffusely stool-filled. Appendix is normal.

Vascular/Lymphatic: Scattered calcifications in the aorta. No
aneurysm. No retroperitoneal lymphadenopathy.

Reproductive: Prostate is unremarkable.

Other: No free air in the abdomen. Abdominal wall musculature
appears intact.

Musculoskeletal: Degenerative changes in the lumbar spine with
degenerative disc disease at L4-5 and L5-S1. No destructive bone
lesions.
IMPRESSION: 1. No renal or ureteral stone or obstruction.
2. Mildly dilated fluid-filled mid abdominal small bowel with
mesenteric edema. Changes may represent early obstruction or
enteritis.

## 2019-06-17 IMAGING — CR DG ABDOMEN 2V
2 series · 2 of 2 positions shown · non-contrast
Comparison: None.

CLINICAL DATA: Abdominal pain for 2 days.

EXAM:
ABDOMEN - 2 VIEW

[abdomen erect]
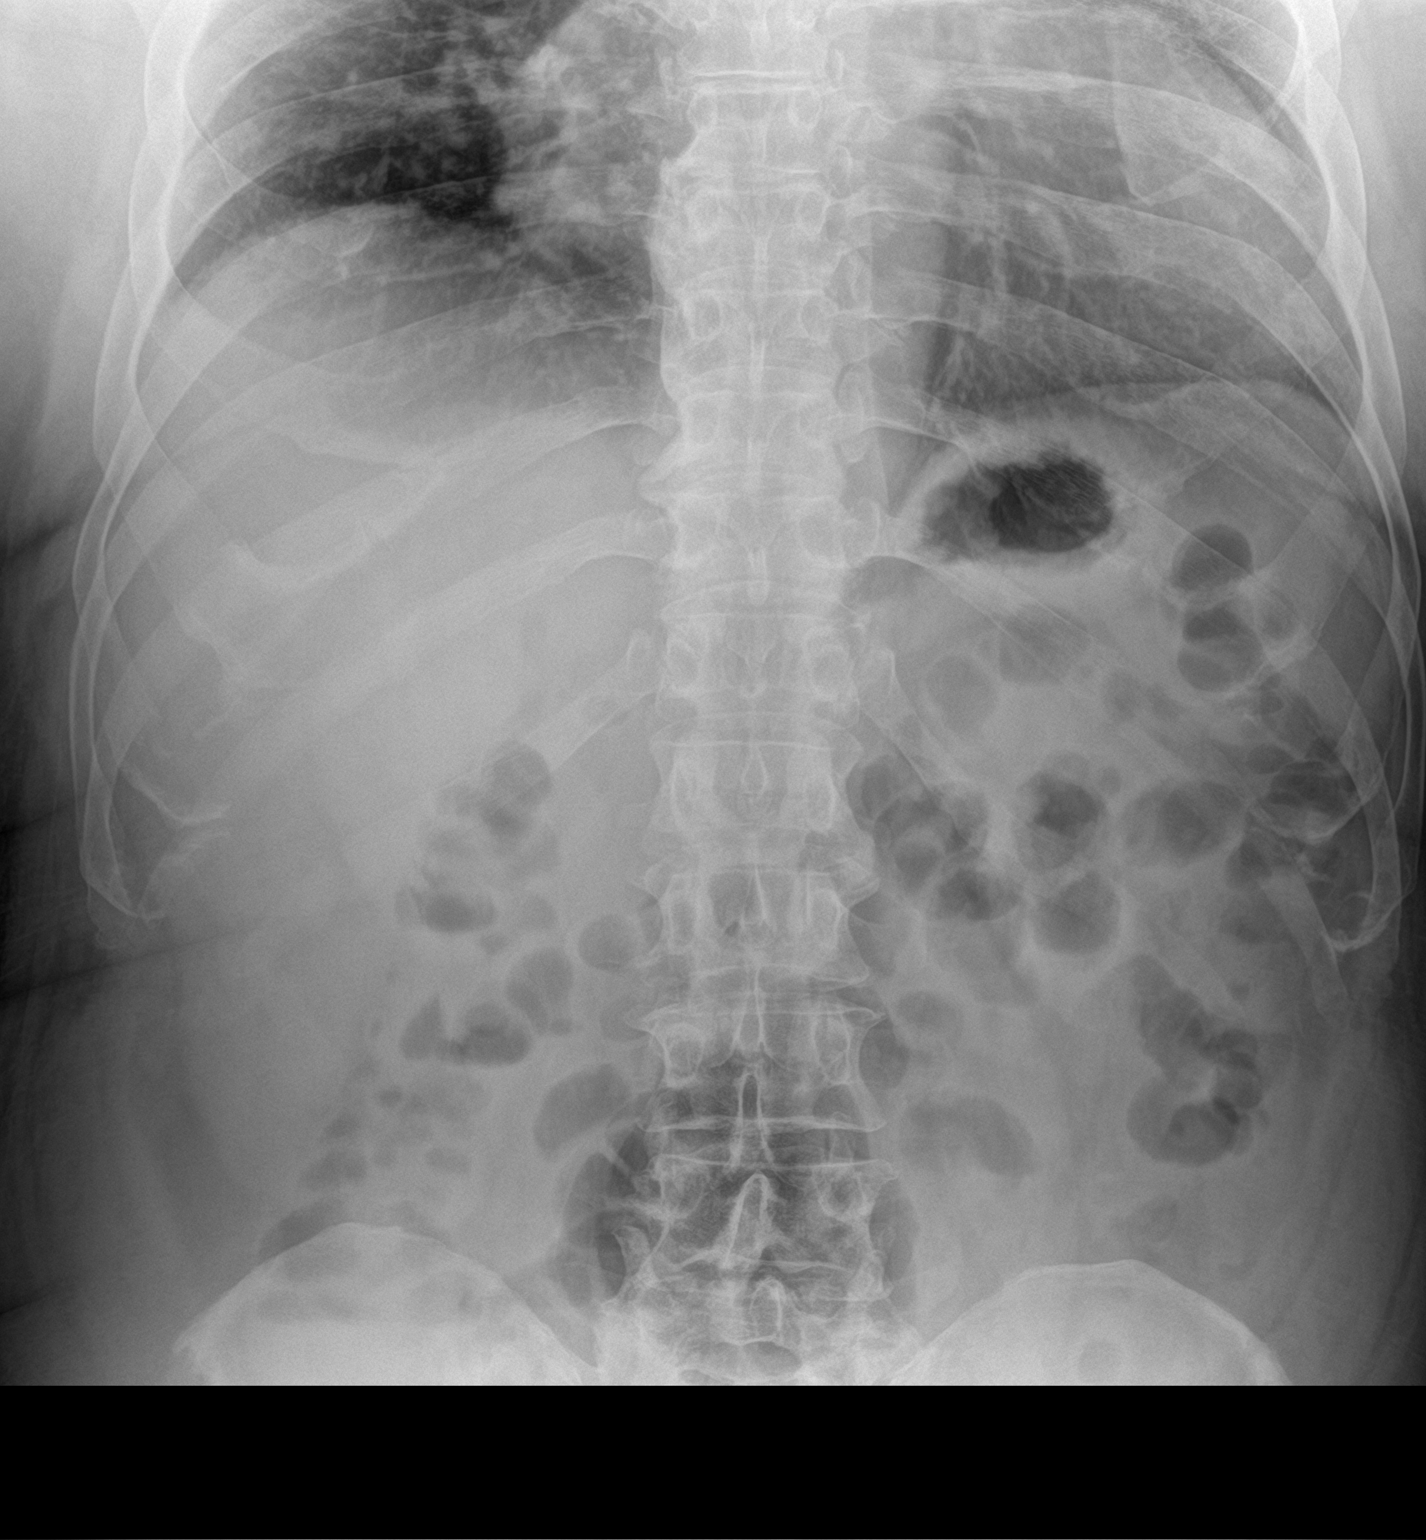

[abdomen supine]
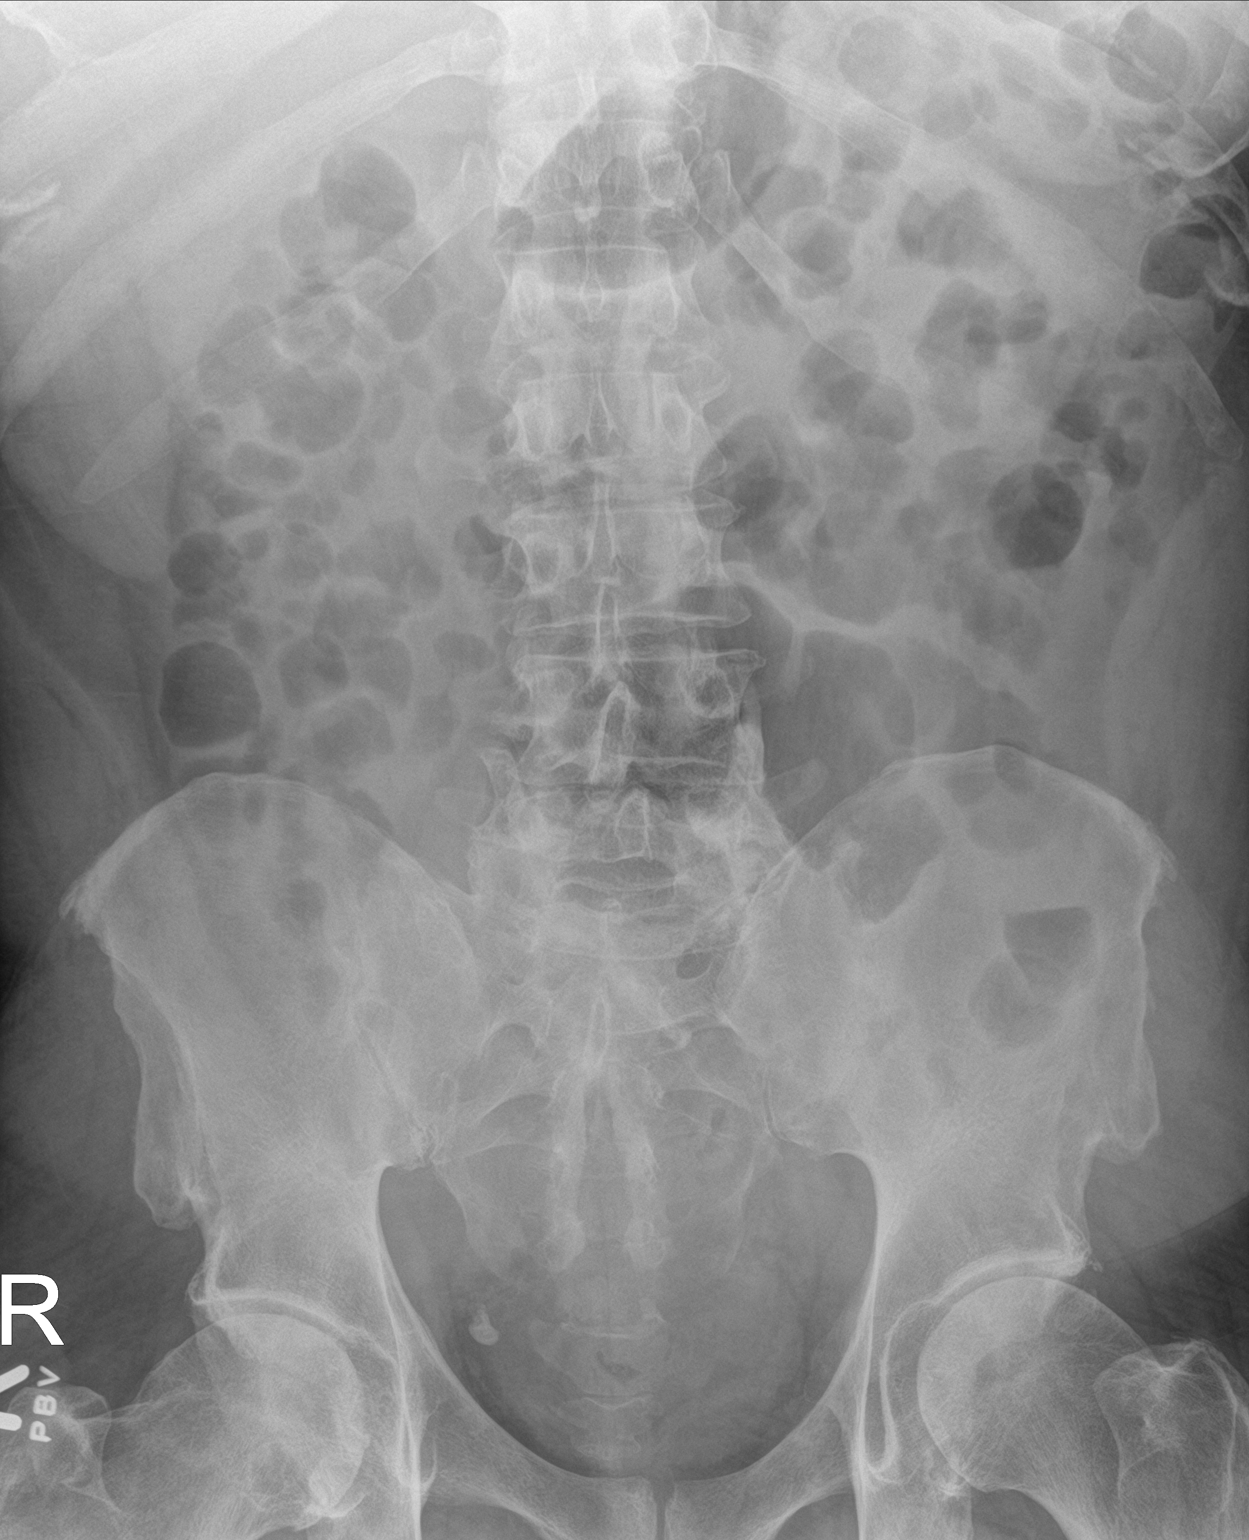

[2 of 2 positions shown; findings below may reference images not displayed]

FINDINGS: No evidence of dilated bowel loops on today's study. There is no
evidence of free air. No radio-opaque calculi or other significant
radiographic abnormality is seen. Right pelvic phlebolith again
noted.
IMPRESSION: Unremarkable bowel gas pattern.  No acute findings.

## 2019-06-29 ENCOUNTER — Telehealth: Payer: No Typology Code available for payment source | Admitting: Cardiology

## 2019-07-03 ENCOUNTER — Telehealth: Payer: Self-pay | Admitting: *Deleted

## 2019-07-03 NOTE — Telephone Encounter (Signed)
No voicemail setup could not leave a message.

## 2019-07-03 NOTE — Telephone Encounter (Signed)
-----   Message from Sueanne Margarita, MD sent at 06/30/2019  4:10 PM EDT ----- Please encourage patient to avoid sleeping supine.  It looks like his mask has a bad leak so please set up appt with DME to assess

## 2019-07-07 ENCOUNTER — Telehealth: Payer: PRIVATE HEALTH INSURANCE | Admitting: Cardiology

## 2019-07-07 ENCOUNTER — Inpatient Hospital Stay: Payer: No Typology Code available for payment source | Admitting: Internal Medicine

## 2019-07-23 ENCOUNTER — Telehealth: Payer: Self-pay | Admitting: *Deleted

## 2019-07-23 NOTE — Telephone Encounter (Signed)
Gave Wife/patient recommendation to avoid sleeping supine and set up appt with DME to assess possible mask leak. Pt/Wfe is aware and agreeable to treatment.

## 2019-07-23 NOTE — Telephone Encounter (Signed)
-----   Message from Traci R Turner, MD sent at 06/30/2019  4:10 PM EDT ----- Please encourage patient to avoid sleeping supine.  It looks like his mask has a bad leak so please set up appt with DME to assess 

## 2019-08-12 ENCOUNTER — Telehealth: Payer: Self-pay | Admitting: *Deleted

## 2019-08-12 NOTE — Telephone Encounter (Signed)

## 2019-08-17 NOTE — Progress Notes (Signed)
Virtual Visit via telephone Note   This visit type was conducted due to national recommendations for restrictions regarding the COVID-19 Pandemic (e.g. social distancing) in an effort to limit this patient's exposure and mitigate transmission in our community.  Due to his co-morbid illnesses, this patient is at least at moderate risk for complications without adequate follow up.  This format is felt to be most appropriate for this patient at this time.  All issues noted in this document were discussed and addressed.  A limited physical exam was performed with this format.  Please refer to the patient's chart for his consent to telehealth for [facility].   Evaluation Performed:  Follow-up visit  This visit type was conducted due to national recommendations for restrictions regarding the COVID-19 Pandemic (e.g. social distancing).  This format is felt to be most appropriate for this patient at this time.  All issues noted in this document were discussed and addressed.  No physical exam was performed (except for noted visual exam findings with Video Visits).  Please refer to the patient's chart (MyChart message for video visits and phone note for telephone visits) for the patient's consent to telehealth for [facility].  Date:  08/18/2019   ID:  [name], DOB May 14, 1961, MRN [id number]  Patient Location:  Home  Provider location:   [place]  PCP:  Robert Quill, PA-C  Cardiologist:  Robert Noe, MD  Sleep Medicine:  Robert Magic, MD Electrophysiologist:  None   Chief Complaint:  OSA  History of Present Illness:    [name] is a 58 y.o. male who presents via audio/video conferencing for a telehealth visit today.   This is a 58yo male with a hx of chronic combined systolic/diastolic CHF, NIDCM and OSA.  He was apparently dx with OSA in 2018 but was never set up on CPAP.  He is now referred by Dr. Katrinka Snow.  He underwent PSG with Epqorth sleepiness score of 7  and showed moderate OSA with AHI 24/hr and O2 desats as low as 83%.  He was titrated on CPAP to 14cm H2O.   He is doing well with his CPAP device and thinks that he has gotten used to it.  He tolerates the full face mask and feels the pressure is adequate.  Since going on CPAP he feels rested in the am and has no significant daytime sleepiness.  He denies any significant mouth or nasal dryness or nasal congestion.  He does not think that he snores.    The patient does not have symptoms concerning for COVID-19 infection (fever, chills, cough, or new shortness of breath).   Prior CV studies:   The following studies were reviewed today:  Sleep study, PAP compliance download  Past Medical History:  Diagnosis Date   Arthritis    "knees" (06/28/2017)   Chronic combined systolic and diastolic heart failure (HCC) 04/19/2017   a. LHC 04/22/17 - Normal coronary arteries; LVEDP 33 // Echo 04/21/17 - Mild LVH, EF 20-25, diff HK, restrictive physiology, mod to severe LAE, mild RVE, mild RAE, PASP 35.// Echo 11/18: Moderate concentric LVH, EF 35-40, diffuse HK, grade 1 diastolic dysfunction, mild LAE, mild RVE    History of gout    Migraine    "I had 1 in the 1990s" (06/28/2017)   NICM (nonischemic cardiomyopathy) (HCC) 04/23/2017   Obesity    Sleep apnea 04/23/2017   Past Surgical History:  Procedure Laterality Date   CARDIAC CATHETERIZATION  04/2017  patent coronary arteries/medical hx   KNEE ARTHROSCOPY Left ~ 2008   LEFT HEART CATH AND CORONARY ANGIOGRAPHY [name] 04/22/2017   Procedure: Left Heart Cath and Coronary Angiography;  Surgeon: Robert Records, MD;  Location: [facility] INVASIVE CV LAB;  Service: Cardiovascular;  Laterality: [name];     Current Meds  Medication Sig   aspirin 81 MG chewable tablet Chew 1 tablet (81 mg total) by mouth daily.   atorvastatin (LIPITOR) 40 MG tablet TAKE 1 TABLET BY MOUTH ONCE DAILY AT  6  PM   furosemide (LASIX) 40 MG tablet Take 2 tablets by mouth twice  daily   lisinopril (PRINIVIL,ZESTRIL) 10 MG tablet TAKE 1 TABLET BY MOUTH ONCE DAILY   metoprolol succinate (TOPROL-XL) 50 MG 24 hr tablet TAKE 1 TABLET BY MOUTH ONCE DAILY (TAKE  WITH  OR  IMMEDIATELY  FOLLOWING  A  MEAL)   potassium chloride (K-DUR) 10 MEQ tablet Take 20 mEq by mouth 2 (two) times daily.     Allergies:   Patient has no known allergies.   Social History   Tobacco Use   Smoking status: Never Smoker   Smokeless tobacco: Never Used  Substance Use Topics   Alcohol use: No   Drug use: No     Family Hx: The patient's family history is not on file.  ROS:   Please see the history of present illness.     All other systems reviewed and are negative.   Labs/Other Tests and Data Reviewed:    Recent Labs: 11/18/2018: ALT 21; BUN 23; Creatinine, Ser 1.13; Potassium 4.6; Sodium 138   Recent Lipid Panel Lab Results  Component Value Date/Time   CHOL 146 11/18/2018 07:53 AM   TRIG 71 11/18/2018 07:53 AM   HDL 47 11/18/2018 07:53 AM   CHOLHDL 3.1 11/18/2018 07:53 AM   CHOLHDL 3.9 04/20/2017 02:24 AM   LDLCALC 85 11/18/2018 07:53 AM    Wt Readings from Last 3 Encounters:  05/26/19 259 lb 6.4 oz (117.7 kg)  03/03/19 260 lb (117.9 kg)  11/17/18 251 lb 12.8 oz (114.2 kg)     Objective:    Vital Signs:  Ht 5\' 10"  (1.778 m)    BMI 37.22 kg/m     ASSESSMENT & PLAN:    1.  OSA - The pathophysiology of obstructive sleep apnea , it's cardiovascular consequences & modes of treatment including CPAP were discused with the patient in detail & they evidenced understanding.  The patient is tolerating PAP therapy well without any problems. The PAP download was reviewed today and showed an AHI of 1.4/hr on 13 cm H2O with 100% compliance in using more than 4 hours nightly.  The patient has been using and benefiting from PAP use and will continue to benefit from therapy.   2.  Chronic combined systolic/diastolic CHF -his weight is stable -denies any LE edema or  SOB -continue Lasix 80mg  BID, spiro 12.5mg  daily, Toprol XL 50mg  daily and Lisinopril 10mg  daily  3.  Obesity -I have encouraged him to get into a routine exercise program and cut back on carbs and portions.   COVID-19 Education: The signs and symptoms of COVID-19 were discussed with the patient and how to seek care for testing (follow up with PCP or arrange E-visit).  The importance of social distancing was discussed today.  Patient Risk:   After full review of this patient's clinical status, I feel that they are at least moderate risk at this time.  Time:   Today,  I have spent 20 minutes directly with the patient on telemedicine discussing medical problems including OSA, Obesity, CHF.  We also reviewed the symptoms of COVID 19 and the ways to protect against contracting the virus with telehealth technology.  I spent an additional 5 minutes reviewing patient's chart including PAP compliance download.  Medication Adjustments/Labs and Tests Ordered: Current medicines are reviewed at length with the patient today.  Concerns regarding medicines are outlined above.  Tests Ordered: No orders of the defined types were placed in this encounter.  Medication Changes: No orders of the defined types were placed in this encounter.   Disposition:  Follow up in 1 year(s)  Signed, Fransico Him, MD  08/18/2019 9:51 AM    [facility]

## 2019-08-18 ENCOUNTER — Other Ambulatory Visit: Payer: Self-pay

## 2019-08-18 ENCOUNTER — Encounter: Payer: Self-pay | Admitting: Cardiology

## 2019-08-18 ENCOUNTER — Telehealth (INDEPENDENT_AMBULATORY_CARE_PROVIDER_SITE_OTHER): Payer: PRIVATE HEALTH INSURANCE | Admitting: Cardiology

## 2019-08-18 VITALS — Ht 70.0 in | Wt 244.0 lb

## 2019-08-18 DIAGNOSIS — I5042 Chronic combined systolic (congestive) and diastolic (congestive) heart failure: Secondary | ICD-10-CM | POA: Diagnosis not present

## 2019-08-18 DIAGNOSIS — G4733 Obstructive sleep apnea (adult) (pediatric): Secondary | ICD-10-CM

## 2019-08-18 DIAGNOSIS — E669 Obesity, unspecified: Secondary | ICD-10-CM | POA: Diagnosis not present

## 2019-08-18 NOTE — Patient Instructions (Signed)
Medication Instructions:  Your physician recommends that you continue on your current medications as directed. Please refer to the Current Medication list given to you today.  *If you need a refill on your cardiac medications before your next appointment, please call your pharmacy*  Lab Work: none If you have labs (blood work) drawn today and your tests are completely normal, you will receive your results only by: . MyChart Message (if you have MyChart) OR . A paper copy in the mail If you have any lab test that is abnormal or we need to change your treatment, we will call you to review the results.  Testing/Procedures: none  Follow-Up: At [facility], you and your health needs are our priority.  As part of our continuing mission to provide you with exceptional heart care, we have created designated Provider Care Teams.  These Care Teams include your primary Cardiologist (physician) and Advanced Practice Providers (APPs -  Physician Assistants and Nurse Practitioners) who all work together to provide you with the care you need, when you need it.  Your next appointment:   12 month(s)  The format for your next appointment:   In Person  Provider:   Traci Turner, MD  Other Instructions    

## 2019-08-24 DIAGNOSIS — G4733 Obstructive sleep apnea (adult) (pediatric): Secondary | ICD-10-CM | POA: Diagnosis not present

## 2019-09-23 DIAGNOSIS — G4733 Obstructive sleep apnea (adult) (pediatric): Secondary | ICD-10-CM | POA: Diagnosis not present

## 2019-11-28 ENCOUNTER — Other Ambulatory Visit: Payer: Self-pay | Admitting: Interventional Cardiology

## 2019-11-28 ENCOUNTER — Other Ambulatory Visit: Payer: Self-pay | Admitting: Physician Assistant

## 2020-01-17 NOTE — Progress Notes (Signed)
Cardiology Office Note:    Date:  01/18/2020   ID:  [name], DOB [birth year], MRN [id number]  PCP:  [name], PA-C  Cardiologist:  Lesleigh Noe, MD   Referring MD: [name], PA-C   Chief Complaint  Patient presents with  . Congestive Heart Failure  . Advice Only    Obstructive sleep apnea    History of Present Illness:    [name] is a 59 y.o. male with a hx of combined systolic and diastolic HF, NICM with EF 20-25,Cardiac Catheterizationwith normal coronary arteries 7/18. Subsequent sleep study 2018 demonstrated obstructive sleep apnea and CPAP is now being used.  Echo in September 2019 demonstrated improved LV function with an EF of 40-45%.  He is doing well.  He no longer works for [employer] .  He works for company by the name of "[employer]".  He still drives.  His work is not physically taxing.  He does drive a long-haul.  He feels well.  He has not had palpitations or syncope.  He denies orthopnea.  There is no peripheral edema.  Past Medical History:  Diagnosis Date  . Arthritis    "knees" (06/28/2017)  . Chronic combined systolic and diastolic heart failure (HCC) 04/19/2017   a. LHC 04/22/17 - Normal coronary arteries; LVEDP 33 // Echo 04/21/17 - Mild LVH, EF 20-25, diff HK, restrictive physiology, mod to severe LAE, mild RVE, mild RAE, PASP 35.// Echo 11/18: Moderate concentric LVH, EF 35-40, diffuse HK, grade 1 diastolic dysfunction, mild LAE, mild RVE   . History of gout   . Migraine    "I had 1 in the 1990s" (06/28/2017)  . NICM (nonischemic cardiomyopathy) (HCC) 04/23/2017  . Obesity   . Sleep apnea 04/23/2017    Past Surgical History:  Procedure Laterality Date  . CARDIAC CATHETERIZATION  04/2017   patent coronary arteries/medical hx  . KNEE ARTHROSCOPY Left ~ 2008  . LEFT HEART CATH AND CORONARY ANGIOGRAPHY [name] 04/22/2017   Procedure: Left Heart Cath and Coronary Angiography;  Surgeon: Lyn Records, MD;  [facility] INVASIVE CV LAB;   Service: Cardiovascular;  Laterality: [name];    Current Medications: Current Meds  Medication Sig  . aspirin 81 MG chewable tablet Chew 1 tablet (81 mg total) by mouth daily.  [name] atorvastatin (LIPITOR) 40 MG tablet TAKE 1 TABLET BY MOUTH ONCE DAILY AT  6  PM  . furosemide (LASIX) 40 MG tablet Take 2 tablets by mouth twice daily  . lisinopril (ZESTRIL) 10 MG tablet Take 1 tablet by mouth once daily  . metoprolol succinate (TOPROL-XL) 50 MG 24 hr tablet TAKE 1 TABLET BY MOUTH ONCE DAILY (TAKE  WITH  OR  IMMEDIATELY  FOLLOWING  A  MEAL)  . Multiple Vitamin (MULTIVITAMIN) tablet Take 1 tablet by mouth daily.  . potassium chloride (KLOR-CON) 10 MEQ tablet Take 2 tablets by mouth twice daily  . spironolactone (ALDACTONE) 25 MG tablet Take 0.5 tablets (12.5 mg total) by mouth daily.  . Turmeric 500 MG TABS Take 2 tablets by mouth daily.     Allergies:   Patient has no known allergies.   Social History   Socioeconomic History  . Marital status: Married    Spouse name: Not on file  . Number of children: Not on file  . Years of education: Not on file  . Highest education level: Not on file  Occupational History  . Not on file  Tobacco Use  . Smoking status:  Never Smoker  . Smokeless tobacco: Never Used  Substance and Sexual Activity  . Alcohol use: No  . Drug use: No  . Sexual activity: Not Currently  Other Topics Concern  . Not on file  Social History Narrative  . Not on file   Social Determinants of Health   Financial Resource Strain:   . Difficulty of Paying Living Expenses:   Food Insecurity:   . Worried About Programme researcher, broadcasting/film/video in the Last Year:   . Barista in the Last Year:   Transportation Needs:   . Freight forwarder (Medical):   [name] Lack of Transportation (Non-Medical):   Physical Activity:   . Days of Exercise per Week:   . Minutes of Exercise per Session:   Stress:   . Feeling of Stress :   Social Connections:   . Frequency of Communication with  Friends and Family:   . Frequency of Social Gatherings with Friends and Family:   . Attends Religious Services:   . Active Member of Clubs or Organizations:   . Attends Banker Meetings:   [name] Marital Status:      Family History: The patient's family history is not on file.  ROS:   Please see the history of present illness.    Has an upcoming DOT physical.  Sleeps well at night.  He has not gotten the COVID-19 vaccine.  He is afraid and concerned that there may be an aftermath.  All other systems reviewed and are negative.  EKGs/Labs/Other Studies Reviewed:    The following studies were reviewed today: 2 D Echocardiogram 2019: Study Conclusions   - Left ventricle: The cavity size was normal. Systolic function was  mildly to moderately reduced. The estimated ejection fraction was  in the range of 40% to 45%. Mild diffuse hypokinesis with no  identifiable regional variations. Doppler parameters are  consistent with abnormal left ventricular relaxation (grade 1  diastolic dysfunction).  - Right ventricle: The cavity size was mildly dilated. Wall  thickness was normal. Systolic function was mildly reduced.   EKG:  EKG demonstrated normal sinus rhythm, low voltage, poor R wave progression V1 through V5.  When compared to the prior tracing performed February 2020 this is more horizontal.  Recent Labs: No results found for requested labs within last 8760 hours.  Recent Lipid Panel    Component Value Date/Time   CHOL 146 11/18/2018 0753   TRIG 71 11/18/2018 0753   HDL 47 11/18/2018 0753   CHOLHDL 3.1 11/18/2018 0753   CHOLHDL 3.9 04/20/2017 0224   VLDL 18 04/20/2017 0224   LDLCALC 85 11/18/2018 0753    Physical Exam:    VS:  BP 106/68   Pulse 61   Ht 5\' 10"  (1.778 m)   Wt 267 lb 1.9 oz (121.2 kg)   SpO2 95%   BMI 38.33 kg/m     Wt Readings from Last 3 Encounters:  01/18/20 267 lb 1.9 oz (121.2 kg)  08/18/19 244 lb (110.7 kg)  05/26/19 259 lb  6.4 oz (117.7 kg)     GEN: Moderate obesity. No acute distress HEENT: Normal NECK: No JVD. LYMPHATICS: No lymphadenopathy CARDIAC:  RRR without murmur, gallop, or edema. VASCULAR:  Normal Pulses. No bruits. RESPIRATORY:  Clear to auscultation without rales, wheezing or rhonchi  ABDOMEN: Soft, non-tender, non-distended, No pulsatile mass, MUSCULOSKELETAL: No deformity  SKIN: Warm and dry NEUROLOGIC:  Alert and oriented x 3 PSYCHIATRIC:  Normal affect   ASSESSMENT:  1. Chronic combined systolic and diastolic heart failure ([place])   2. OSA (obstructive sleep apnea)   3. NICM (nonischemic cardiomyopathy) ([place])   4. Essential hypertension   5. NSVT (nonsustained ventricular tachycardia) (HCC)   6. Educated about COVID-19 virus infection    PLAN:    In order of problems listed above:  1. No clinical evidence of volume overload.  2D Doppler echocardiogram to assess current LV function on Aldactone, Zestril, furosemide, and Toprol-XL which should be continued. 2. Continue compliance with CPAP. 3. Low voltage raises question of amyloid.  May need to consider a amyloid technetium pyrophosphate scan. 4. Blood pressure is excellent.  Continue medications as listed above under heart failure. 5. No symptoms.  No recent evaluation. 6. He is hesitant to take vaccine.  So far he is not excepted the idea of vaccination.  Social distancing, mask wearing, and handwashing is discussed.  Guideline directed therapy for left ventricular systolic dysfunction: Angiotensin receptor-neprilysin inhibitor (ARNI)-Entresto; beta-blocker therapy - carvedilol or metoprolol succinate; mineralocorticoid receptor antagonist (MRA) therapy -spironolactone or eplerenone.  These therapies have been shown to improve clinical outcomes including reduction of rehospitalization survival, and acute heart failure.    Medication Adjustments/Labs and Tests Ordered: Current medicines are reviewed at length with the patient  today.  Concerns regarding medicines are outlined above.  Orders Placed This Encounter  Procedures  . Hepatic function panel  . Lipid panel  . Basic metabolic panel  . HgB A1c  . EKG 12-Lead  . ECHOCARDIOGRAM COMPLETE   No orders of the defined types were placed in this encounter.   Patient Instructions  Medication Instructions:  Your physician recommends that you continue on your current medications as directed. Please refer to the Current Medication list given to you today.  *If you need a refill on your cardiac medications before your next appointment, please call your pharmacy*   Lab Work: BMET, Liver, Lipid and A1C when you come for your echo.  Make sure you are fasting (nothing to eat or drink after midnight except water and black coffee).  If you have labs (blood work) drawn today and your tests are completely normal, you will receive your results only by: [name] MyChart Message (if you have MyChart) OR . A paper copy in the mail If you have any lab test that is abnormal or we need to change your treatment, we will call you to review the results.   Testing/Procedures: Your physician has requested that you have an echocardiogram. Echocardiography is a painless test that uses sound waves to create images of your heart. It provides your doctor with information about the size and shape of your heart and how well your heart's chambers and valves are working. This procedure takes approximately one hour. There are no restrictions for this procedure.    Follow-Up: At [facility], you and your health needs are our priority.  As part of our continuing mission to provide you with exceptional heart care, we have created designated Provider Care Teams.  These Care Teams include your primary Cardiologist (physician) and Advanced Practice Providers (APPs -  Physician Assistants and Nurse Practitioners) who all work together to provide you with the care you need, when you need it.  We  recommend signing up for the patient portal called "MyChart".  Sign up information is provided on this After Visit Summary.  MyChart is used to connect with patients for Virtual Visits (Telemedicine).  Patients are able to view lab/test results, encounter notes, upcoming appointments,  etc.  Non-urgent messages can be sent to your provider as well.   To learn more about what you can do with MyChart, go to [email or web address].    Your next appointment:   12 month(s)  The format for your next appointment:   In Person  Provider:   You may see Lesleigh Noe, MD or one of the following Advanced Practice Providers on your designated Care Team:    Norma Fredrickson, NP  Nada Boozer, NP  Georgie Chard, NP    Other Instructions      Signed, Lesleigh Noe, MD  01/18/2020 5:47 PM    [facility]

## 2020-01-18 ENCOUNTER — Ambulatory Visit (INDEPENDENT_AMBULATORY_CARE_PROVIDER_SITE_OTHER): Payer: BC Managed Care – PPO | Admitting: Interventional Cardiology

## 2020-01-18 ENCOUNTER — Other Ambulatory Visit: Payer: Self-pay

## 2020-01-18 ENCOUNTER — Encounter: Payer: Self-pay | Admitting: Interventional Cardiology

## 2020-01-18 VITALS — BP 106/68 | HR 61 | Ht 70.0 in | Wt 267.1 lb

## 2020-01-18 DIAGNOSIS — I5042 Chronic combined systolic (congestive) and diastolic (congestive) heart failure: Secondary | ICD-10-CM | POA: Diagnosis not present

## 2020-01-18 DIAGNOSIS — I428 Other cardiomyopathies: Secondary | ICD-10-CM

## 2020-01-18 DIAGNOSIS — I4729 Other ventricular tachycardia: Secondary | ICD-10-CM

## 2020-01-18 DIAGNOSIS — Z7189 Other specified counseling: Secondary | ICD-10-CM

## 2020-01-18 DIAGNOSIS — G4733 Obstructive sleep apnea (adult) (pediatric): Secondary | ICD-10-CM | POA: Diagnosis not present

## 2020-01-18 DIAGNOSIS — I10 Essential (primary) hypertension: Secondary | ICD-10-CM

## 2020-01-18 DIAGNOSIS — I472 Ventricular tachycardia: Secondary | ICD-10-CM

## 2020-01-18 NOTE — Patient Instructions (Signed)
Medication Instructions:  Your physician recommends that you continue on your current medications as directed. Please refer to the Current Medication list given to you today.  *If you need a refill on your cardiac medications before your next appointment, please call your pharmacy*   Lab Work: BMET, Liver, Lipid and A1C when you come for your echo.  Make sure you are fasting (nothing to eat or drink after midnight except water and black coffee).  If you have labs (blood work) drawn today and your tests are completely normal, you will receive your results only by: [name] MyChart Message (if you have MyChart) OR . A paper copy in the mail If you have any lab test that is abnormal or we need to change your treatment, we will call you to review the results.   Testing/Procedures: Your physician has requested that you have an echocardiogram. Echocardiography is a painless test that uses sound waves to create images of your heart. It provides your doctor with information about the size and shape of your heart and how well your heart's chambers and valves are working. This procedure takes approximately one hour. There are no restrictions for this procedure.    Follow-Up: At [facility], you and your health needs are our priority.  As part of our continuing mission to provide you with exceptional heart care, we have created designated Provider Care Teams.  These Care Teams include your primary Cardiologist (physician) and Advanced Practice Providers (APPs -  Physician Assistants and Nurse Practitioners) who all work together to provide you with the care you need, when you need it.  We recommend signing up for the patient portal called "MyChart".  Sign up information is provided on this After Visit Summary.  MyChart is used to connect with patients for Virtual Visits (Telemedicine).  Patients are able to view lab/test results, encounter notes, upcoming appointments, etc.  Non-urgent messages can be sent  to your provider as well.   To learn more about what you can do with MyChart, go to [email or web address].    Your next appointment:   12 month(s)  The format for your next appointment:   In Person  Provider:   You may see Lesleigh Noe, MD or one of the following Advanced Practice Providers on your designated Care Team:    Norma Fredrickson, NP  Nada Boozer, NP  Georgie Chard, NP    Other Instructions

## 2020-01-26 ENCOUNTER — Other Ambulatory Visit: Payer: Self-pay | Admitting: Interventional Cardiology

## 2020-02-01 ENCOUNTER — Other Ambulatory Visit: Payer: BC Managed Care – PPO | Admitting: *Deleted

## 2020-02-01 ENCOUNTER — Ambulatory Visit (HOSPITAL_COMMUNITY): Payer: BC Managed Care – PPO | Attending: Internal Medicine

## 2020-02-01 ENCOUNTER — Other Ambulatory Visit: Payer: Self-pay

## 2020-02-01 DIAGNOSIS — I5042 Chronic combined systolic (congestive) and diastolic (congestive) heart failure: Secondary | ICD-10-CM | POA: Diagnosis not present

## 2020-02-01 DIAGNOSIS — I428 Other cardiomyopathies: Secondary | ICD-10-CM | POA: Diagnosis not present

## 2020-02-01 DIAGNOSIS — I10 Essential (primary) hypertension: Secondary | ICD-10-CM | POA: Diagnosis not present

## 2020-02-01 DIAGNOSIS — I472 Ventricular tachycardia: Secondary | ICD-10-CM | POA: Diagnosis not present

## 2020-02-01 DIAGNOSIS — I4729 Other ventricular tachycardia: Secondary | ICD-10-CM

## 2020-02-02 LAB — BASIC METABOLIC PANEL
BUN/Creatinine Ratio: 11 (ref 9–20)
BUN: 10 mg/dL (ref 6–24)
CO2: 28 mmol/L (ref 20–29)
Calcium: 9.7 mg/dL (ref 8.7–10.2)
Chloride: 99 mmol/L (ref 96–106)
Creatinine, Ser: 0.95 mg/dL (ref 0.76–1.27)
GFR calc Af Amer: 102 mL/min/{1.73_m2} (ref >59)
GFR calc non Af Amer: 88 mL/min/{1.73_m2} (ref >59)
Glucose: 97 mg/dL (ref 65–99)
Potassium: 4.3 mmol/L (ref 3.5–5.2)
Sodium: 140 mmol/L (ref 134–144)

## 2020-02-02 LAB — LIPID PANEL
Chol/HDL Ratio: 3.2 ratio (ref 0.0–5.0)
Cholesterol, Total: 136 mg/dL (ref 100–199)
HDL: 42 mg/dL (ref >39)
LDL Chol Calc (NIH): 78 mg/dL (ref 0–99)
Triglycerides: 80 mg/dL (ref 0–149)
VLDL Cholesterol Cal: 16 mg/dL (ref 5–40)

## 2020-02-02 LAB — HEPATIC FUNCTION PANEL
ALT: 16 IU/L (ref 0–44)
AST: 18 IU/L (ref 0–40)
Albumin: 4.5 g/dL (ref 3.8–4.9)
Alkaline Phosphatase: 110 IU/L (ref 39–117)
Bilirubin Total: 0.3 mg/dL (ref 0.0–1.2)
Bilirubin, Direct: 0.12 mg/dL (ref 0.00–0.40)
Total Protein: 7.4 g/dL (ref 6.0–8.5)

## 2020-02-02 LAB — HEMOGLOBIN A1C
Est. average glucose Bld gHb Est-mCnc: 123 mg/dL
Hgb A1c MFr Bld: 5.9 % — ABNORMAL HIGH (ref 4.8–5.6)

## 2020-06-06 ENCOUNTER — Other Ambulatory Visit: Payer: Self-pay | Admitting: Physician Assistant

## 2020-07-28 ENCOUNTER — Other Ambulatory Visit: Payer: Self-pay | Admitting: Cardiology

## 2020-09-15 DIAGNOSIS — H16002 Unspecified corneal ulcer, left eye: Secondary | ICD-10-CM | POA: Diagnosis not present

## 2020-09-16 ENCOUNTER — Other Ambulatory Visit: Payer: Self-pay | Admitting: Interventional Cardiology

## 2020-09-16 DIAGNOSIS — H16002 Unspecified corneal ulcer, left eye: Secondary | ICD-10-CM | POA: Diagnosis not present

## 2020-11-10 ENCOUNTER — Other Ambulatory Visit: Payer: Self-pay | Admitting: Physician Assistant

## 2021-01-24 ENCOUNTER — Telehealth: Payer: Self-pay | Admitting: Interventional Cardiology

## 2021-01-24 ENCOUNTER — Telehealth: Payer: Self-pay | Admitting: Cardiology

## 2021-01-24 DIAGNOSIS — I5042 Chronic combined systolic (congestive) and diastolic (congestive) heart failure: Secondary | ICD-10-CM

## 2021-01-24 NOTE — Telephone Encounter (Signed)
Yes: Combined systolic and diastolic heart faillure.

## 2021-01-24 NOTE — Telephone Encounter (Signed)
Ok to order echo

## 2021-01-24 NOTE — Telephone Encounter (Signed)
Order placed for echo.

## 2021-01-24 NOTE — Telephone Encounter (Signed)
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

## 2021-01-24 NOTE — Telephone Encounter (Signed)
Patient requesting the information from his sleep apnea machine for the past year. He states he has to have it for his physical and would like to pick it up at the office.

## 2021-01-31 NOTE — Progress Notes (Signed)
Cardiology Office Note:    Date:  02/02/2021   ID:  [name], DOB 1961-02-24, MRN [id number]  PCP:  [name], PA-C  Cardiologist:  Lesleigh Noe, MD   Referring MD: [name], PA-C   Chief Complaint  Patient presents with  . Congestive Heart Failure    History of Present Illness:    [name] is a 60 y.o. male with a hx of combined systolic and diastolic HF, NICM with EF 20-25 2018 to 45% 2019 on GDMT (BB, ACE, and MRA), normal coronary arteries 7/18, obstructive sleep apnea and CPAP (2018).  [name] is not getting much physical activity due to left knee arthritis.  He does not get much activity on his job.  He drives a commercial truck only.  He does not do any unloading.  He denies orthopnea, PND, chest pain, syncope, and peripheral edema. He is quasi compliant with CPAP.  There are no medication side effects.  Significant discussion concerning GDMT for systolic heart failure.  He is not on an Arni or an SGLT2.  The importance of the medications were discussed.  Past Medical History:  Diagnosis Date  . Arthritis    "knees" (06/28/2017)  . Chronic combined systolic and diastolic heart failure (HCC) 04/19/2017   a. LHC 04/22/17 - Normal coronary arteries; LVEDP 33 // Echo 04/21/17 - Mild LVH, EF 20-25, diff HK, restrictive physiology, mod to severe LAE, mild RVE, mild RAE, PASP 35.// Echo 11/18: Moderate concentric LVH, EF 35-40, diffuse HK, grade 1 diastolic dysfunction, mild LAE, mild RVE   . History of gout   . Migraine    "I had 1 in the 1990s" (06/28/2017)  . NICM (nonischemic cardiomyopathy) (HCC) 04/23/2017  . Obesity   . Sleep apnea 04/23/2017    Past Surgical History:  Procedure Laterality Date  . CARDIAC CATHETERIZATION  04/2017   patent coronary arteries/medical hx  . KNEE ARTHROSCOPY Left ~ 2008  . LEFT HEART CATH AND CORONARY ANGIOGRAPHY [name] 04/22/2017   Procedure: Left Heart Cath and Coronary Angiography;  Surgeon: Lyn Records,  MD;  Location: [facility] INVASIVE CV LAB;  Service: Cardiovascular;  Laterality: [name];    Current Medications: Current Meds  Medication Sig  . aspirin 81 MG chewable tablet Chew 1 tablet (81 mg total) by mouth daily.  [name] atorvastatin (LIPITOR) 40 MG tablet TAKE 1 TABLET BY MOUTH ONCE DAILY AT 6 PM  . furosemide (LASIX) 40 MG tablet Take 2 tablets by mouth twice daily  . lisinopril (ZESTRIL) 10 MG tablet Take 1 tablet by mouth once daily  . metoprolol succinate (TOPROL-XL) 50 MG 24 hr tablet TAKE 1 TABLET BY MOUTH ONCE DAILY ( TAKE WITH OR IMMEDIATELY FOLLOWING A MEAL )  . Multiple Vitamin (MULTIVITAMIN) tablet Take 1 tablet by mouth daily.  . potassium chloride (KLOR-CON) 10 MEQ tablet Take 2 tablets (20 mEq total) by mouth 2 (two) times daily. Please make yearly appt with Dr. Katrinka Blazing for April 2022 for future refills. Thank you 1st attempt  . spironolactone (ALDACTONE) 25 MG tablet TAKE 1/2 TABLET(12.5 MG) BY MOUTH DAILY     Allergies:   Patient has no known allergies.   Social History   Socioeconomic History  . Marital status: Married    Spouse name: Not on file  . Number of children: Not on file  . Years of education: Not on file  . Highest education level: Not on file  Occupational History  . Not on file  Tobacco Use  . Smoking status: Never Smoker  . Smokeless tobacco: Never Used  Vaping Use  . Vaping Use: Never used  Substance and Sexual Activity  . Alcohol use: No  . Drug use: No  . Sexual activity: Not Currently  Other Topics Concern  . Not on file  Social History Narrative  . Not on file   Social Determinants of Health   Financial Resource Strain: Not on file  Food Insecurity: Not on file  Transportation Needs: Not on file  Physical Activity: Not on file  Stress: Not on file  Social Connections: Not on file     Family History: The patient's family history is not on file.  ROS:   Please see the history of present illness.    Discerned about his uncle that lives  in [place].  He has started taking sea moss as a natural therapy for his heart condition.  I made him aware that there was no data that I could share with him towards improved outcome related to this over-the-counter therapy.  All other systems reviewed and are negative.  EKGs/Labs/Other Studies Reviewed:    The following studies were reviewed today:  2D Doppler echocardiogram February 01, 2020: IMPRESSIONS    1. LVEF is approximately 45 to 50% with mild inferoseptal and apical  hypokinesis. IN comparison to echo images from 2019 no significant change  in LVEF . There is mild left ventricular hypertrophy. Left ventricular  diastolic parameters are consistent  with Grade I diastolic dysfunction (impaired relaxation).  2. Right ventricular systolic function is normal. The right ventricular  size is normal.  3. The mitral valve is normal in structure. Mild mitral valve  regurgitation.  4. The aortic valve is normal in structure. Aortic valve regurgitation is  not visualized.  5. The inferior vena cava is normal in size with greater than 50%  respiratory variability, suggesting right atrial pressure of 3 mmHg.    EKG:  EKG normal sinus rhythm, low voltage, no change compared to prior tracing done in April 2021.  Recent Labs: No results found for requested labs within last 8760 hours.  Recent Lipid Panel    Component Value Date/Time   CHOL 136 02/01/2020 0738   TRIG 80 02/01/2020 0738   HDL 42 02/01/2020 0738   CHOLHDL 3.2 02/01/2020 0738   CHOLHDL 3.9 04/20/2017 0224   VLDL 18 04/20/2017 0224   LDLCALC 78 02/01/2020 0738    Physical Exam:    VS:  BP 124/74   Pulse 75   Ht 5\' 10"  (1.778 m)   Wt 296 lb 12.8 oz (134.6 kg)   SpO2 96%   BMI 42.59 kg/m     Wt Readings from Last 3 Encounters:  02/02/21 296 lb 12.8 oz (134.6 kg)  01/18/20 267 lb 1.9 oz (121.2 kg)  08/18/19 244 lb (110.7 kg)     GEN: Morbidly obese.  Weight is increased by 30 pounds since the last  office visit.  And by 52 pounds since November 2020.December 2020 No acute distress HEENT: Normal NECK: No JVD. LYMPHATICS: No lymphadenopathy CARDIAC: No murmur. RRR S4 gallop, or edema. VASCULAR:  Normal Pulses. No bruits. RESPIRATORY:  Clear to auscultation without rales, wheezing or rhonchi  ABDOMEN: Soft, non-tender, non-distended, No pulsatile mass, MUSCULOSKELETAL: No deformity  SKIN: Warm and dry NEUROLOGIC:  Alert and oriented x 3 PSYCHIATRIC:  Normal affect   ASSESSMENT:    1. Chronic combined systolic and diastolic heart failure (HCC)   2. OSA (  obstructive sleep apnea)   3. Essential hypertension   4. NSVT (nonsustained ventricular tachycardia) (HCC)   5. NICM (nonischemic cardiomyopathy) (HCC)    PLAN:    In order of problems listed above:  1. Low normal systolic function when last evaluated 1 year ago.  Continue current preventive therapy/GDMT for systolic dysfunction.  Consider adding SGLT2 and switching ACE inhibitor to [employer].  2D Doppler echocardiogram will be reevaluated.  Relatively low voltage on EKG compared with muscle thickness on prior echo raises question of amyloid heart.  We will get a technetium pyrophosphate amyloid scan. 2. Encourage compliance with CPAP 3. Continue current regimen as noted above including furosemide 40 mg twice daily, Toprol, Zestril, Aldactone.  Basic metabolic panel today. 4. No complaints of palpitations 5. As noted above under #1.  Guideline directed therapy for left ventricular systolic dysfunction: Angiotensin receptor-neprilysin inhibitor (ARNI)-Entresto; beta-blocker therapy - carvedilol, metoprolol succinate, or bisoprolol; mineralocorticoid receptor antagonist (MRA) therapy -spironolactone or eplerenone.  SGLT-2 agents -  Dapagliflozin Marcelline Deist) or Empagliflozin (Jardiance).These therapies have been shown to improve clinical outcomes including reduction of rehospitalization, survival, and acute heart failure.    Medication  Adjustments/Labs and Tests Ordered: Current medicines are reviewed at length with the patient today.  Concerns regarding medicines are outlined above.  Orders Placed This Encounter  Procedures  . EKG 12-Lead   No orders of the defined types were placed in this encounter.   There are no Patient Instructions on file for this visit.   Signed, Lesleigh Noe, MD  02/02/2021 8:55 AM    [facility]

## 2021-02-02 ENCOUNTER — Other Ambulatory Visit: Payer: Self-pay

## 2021-02-02 ENCOUNTER — Ambulatory Visit (INDEPENDENT_AMBULATORY_CARE_PROVIDER_SITE_OTHER): Payer: BC Managed Care – PPO | Admitting: Interventional Cardiology

## 2021-02-02 ENCOUNTER — Encounter: Payer: Self-pay | Admitting: Interventional Cardiology

## 2021-02-02 VITALS — BP 124/74 | HR 75 | Ht 70.0 in | Wt 296.8 lb

## 2021-02-02 DIAGNOSIS — I5042 Chronic combined systolic (congestive) and diastolic (congestive) heart failure: Secondary | ICD-10-CM

## 2021-02-02 DIAGNOSIS — I10 Essential (primary) hypertension: Secondary | ICD-10-CM | POA: Diagnosis not present

## 2021-02-02 DIAGNOSIS — G4733 Obstructive sleep apnea (adult) (pediatric): Secondary | ICD-10-CM | POA: Diagnosis not present

## 2021-02-02 DIAGNOSIS — I428 Other cardiomyopathies: Secondary | ICD-10-CM

## 2021-02-02 DIAGNOSIS — I472 Ventricular tachycardia: Secondary | ICD-10-CM

## 2021-02-02 DIAGNOSIS — I4729 Other ventricular tachycardia: Secondary | ICD-10-CM

## 2021-02-02 LAB — CBC
Hematocrit: 41.9 % (ref 37.5–51.0)
Hemoglobin: 14.3 g/dL (ref 13.0–17.7)
MCH: 30.6 pg (ref 26.6–33.0)
MCHC: 34.1 g/dL (ref 31.5–35.7)
MCV: 90 fL (ref 79–97)
Platelets: 282 10*3/uL (ref 150–450)
RBC: 4.68 x10E6/uL (ref 4.14–5.80)
RDW: 12.2 % (ref 11.6–15.4)
WBC: 5.9 10*3/uL (ref 3.4–10.8)

## 2021-02-02 LAB — BASIC METABOLIC PANEL
BUN/Creatinine Ratio: 13 (ref 9–20)
BUN: 15 mg/dL (ref 6–24)
CO2: 24 mmol/L (ref 20–29)
Calcium: 9.7 mg/dL (ref 8.7–10.2)
Chloride: 102 mmol/L (ref 96–106)
Creatinine, Ser: 1.15 mg/dL (ref 0.76–1.27)
Glucose: 107 mg/dL — ABNORMAL HIGH (ref 65–99)
Potassium: 4.2 mmol/L (ref 3.5–5.2)
Sodium: 142 mmol/L (ref 134–144)
eGFR: 73 mL/min/{1.73_m2} (ref >59)

## 2021-02-02 NOTE — Telephone Encounter (Signed)
Report printed and will be at the front desk.

## 2021-02-02 NOTE — Patient Instructions (Addendum)
Medication Instructions:  Your physician recommends that you continue on your current medications as directed. Please refer to the Current Medication list given to you today.  *If you need a refill on your cardiac medications before your next appointment, please call your pharmacy*   Lab Work: Bmet and CBC today If you have labs (blood work) drawn today and your tests are completely normal, you will receive your results only by: [name] MyChart Message (if you have MyChart) OR . A paper copy in the mail If you have any lab test that is abnormal or we need to change your treatment, we will call you to review the results.   Testing/Procedures: Your physician recommends you have a amyloid test.   Follow-Up: At [facility], you and your health needs are our priority.  As part of our continuing mission to provide you with exceptional heart care, we have created designated Provider Care Teams.  These Care Teams include your primary Cardiologist (physician) and Advanced Practice Providers (APPs -  Physician Assistants and Nurse Practitioners) who all work together to provide you with the care you need, when you need it.  We recommend signing up for the patient portal called "MyChart".  Sign up information is provided on this After Visit Summary.  MyChart is used to connect with patients for Virtual Visits (Telemedicine).  Patients are able to view lab/test results, encounter notes, upcoming appointments, etc.  Non-urgent messages can be sent to your provider as well.   To learn more about what you can do with MyChart, go to [email or web address].    Your next appointment:   1 year(s)  The format for your next appointment:   In Person  Provider:   You may see Lesleigh Noe, MD or one of the following Advanced Practice Providers on your designated Care Team:    Georgie Chard, NP    Other Instructions

## 2021-02-08 ENCOUNTER — Other Ambulatory Visit: Payer: Self-pay

## 2021-02-08 ENCOUNTER — Ambulatory Visit (HOSPITAL_COMMUNITY): Payer: BC Managed Care – PPO | Attending: Cardiology

## 2021-02-08 DIAGNOSIS — I5042 Chronic combined systolic (congestive) and diastolic (congestive) heart failure: Secondary | ICD-10-CM | POA: Diagnosis not present

## 2021-02-08 LAB — ECHOCARDIOGRAM COMPLETE
Area-P 1/2: 2.81 cm2
S' Lateral: 4.4 cm

## 2021-02-08 MED ORDER — PERFLUTREN LIPID MICROSPHERE
1.0000 mL | INTRAVENOUS | Status: AC | PRN
  Administered 2021-02-08: 1 mL via INTRAVENOUS

## 2021-02-09 ENCOUNTER — Telehealth: Payer: Self-pay | Admitting: Interventional Cardiology

## 2021-02-09 NOTE — Telephone Encounter (Signed)
Patient returning call for lab results. 

## 2021-02-09 NOTE — Telephone Encounter (Signed)
Informed pt of results. Pt verbalized understanding. 

## 2021-02-15 ENCOUNTER — Telehealth (HOSPITAL_COMMUNITY): Payer: Self-pay | Admitting: *Deleted

## 2021-02-15 NOTE — Telephone Encounter (Signed)
Attempted to leave a message on voicemail but no voicemail available and no answer in reference to upcoming appointment scheduled for 02/22/21. Phone number given for a call back so details instructions can be given. Crystalee Ventress, Adelene Idler No mychart available.

## 2021-02-21 ENCOUNTER — Telehealth (HOSPITAL_COMMUNITY): Payer: Self-pay | Admitting: *Deleted

## 2021-02-21 NOTE — Telephone Encounter (Signed)
Attempted to call patient regarding upcoming appointment- no answer, unable to leave a message.  [name]  

## 2021-02-22 ENCOUNTER — Encounter (HOSPITAL_COMMUNITY): Payer: BC Managed Care – PPO

## 2021-03-12 ENCOUNTER — Other Ambulatory Visit: Payer: Self-pay | Admitting: Cardiology

## 2021-03-26 ENCOUNTER — Other Ambulatory Visit: Payer: Self-pay | Admitting: Interventional Cardiology

## 2021-04-11 ENCOUNTER — Other Ambulatory Visit: Payer: Self-pay | Admitting: Interventional Cardiology

## 2021-06-08 ENCOUNTER — Ambulatory Visit: Admit: 2021-06-08 | Discharge: 2021-06-09 | Payer: PRIVATE HEALTH INSURANCE

## 2021-06-08 DIAGNOSIS — Z1211 Encounter for screening for malignant neoplasm of colon: Principal | ICD-10-CM

## 2021-06-08 DIAGNOSIS — Z0000 Encounter for general adult medical examination without abnormal findings: Principal | ICD-10-CM

## 2021-06-08 DIAGNOSIS — C9210 Chronic myeloid leukemia, BCR/ABL-positive, not having achieved remission: Principal | ICD-10-CM

## 2021-06-08 DIAGNOSIS — Z125 Encounter for screening for malignant neoplasm of prostate: Principal | ICD-10-CM

## 2021-06-14 ENCOUNTER — Telehealth (HOSPITAL_COMMUNITY): Payer: Self-pay | Admitting: *Deleted

## 2021-06-14 MED ORDER — TESTOSTERONE 20.25 MG/1.25 GRAM (1.62 %) TRANSDERMAL GEL PUMP
Freq: Every day | TRANSDERMAL | 0 refills | 0.00000 days | Status: CP
Start: 2021-06-14 — End: ?

## 2021-06-14 NOTE — Telephone Encounter (Signed)
Close encounter 

## 2021-06-16 ENCOUNTER — Ambulatory Visit (HOSPITAL_COMMUNITY)
Admission: RE | Admit: 2021-06-16 | Discharge: 2021-06-16 | Disposition: A | Discharge status: Home / Self Care | Payer: BC Managed Care – PPO | Source: Ambulatory Visit | Attending: Cardiovascular Disease | Admitting: Cardiovascular Disease

## 2021-06-16 ENCOUNTER — Other Ambulatory Visit: Payer: Self-pay

## 2021-06-16 DIAGNOSIS — I5042 Chronic combined systolic (congestive) and diastolic (congestive) heart failure: Secondary | ICD-10-CM | POA: Diagnosis not present

## 2021-06-16 MED ORDER — TECHNETIUM TC 99M PYROPHOSPHATE
20.3000 | Freq: Once | INTRAVENOUS | Status: AC
  Administered 2021-06-16: 20.3 via INTRAVENOUS

## 2021-06-25 ENCOUNTER — Other Ambulatory Visit: Payer: Self-pay | Admitting: Interventional Cardiology

## 2021-06-26 ENCOUNTER — Other Ambulatory Visit: Payer: Self-pay | Admitting: *Deleted

## 2021-06-26 MED ORDER — POTASSIUM CHLORIDE CRYS ER 10 MEQ PO TBCR: 20.0000 meq | EXTENDED_RELEASE_TABLET | Freq: Two times a day (BID) | ORAL | 2 refills | Status: DC

## 2021-06-30 MED ORDER — PROPRANOLOL 10 MG TABLET
ORAL_TABLET | Freq: Every day | ORAL | 3 refills | 270.00000 days | Status: CP | PRN
Start: 2021-06-30 — End: ?

## 2021-08-14 ENCOUNTER — Other Ambulatory Visit: Payer: Self-pay | Admitting: Interventional Cardiology

## 2021-08-21 MED ORDER — TESTOSTERONE 20.25 MG/1.25 GRAM (1.62 %) TRANSDERMAL GEL PUMP
Freq: Every day | TRANSDERMAL | 1 refills | 0 days | Status: CP
Start: 2021-08-21 — End: ?

## 2021-09-18 ENCOUNTER — Ambulatory Visit: Admit: 2021-09-18 | Discharge: 2021-09-19 | Payer: PRIVATE HEALTH INSURANCE | Attending: Family | Primary: Family

## 2021-09-18 DIAGNOSIS — R059 Cough, unspecified type: Principal | ICD-10-CM

## 2021-09-18 DIAGNOSIS — J029 Acute pharyngitis, unspecified: Principal | ICD-10-CM

## 2021-09-18 DIAGNOSIS — R509 Fever, unspecified: Principal | ICD-10-CM

## 2021-09-18 DIAGNOSIS — B9689 Other specified bacterial agents as the cause of diseases classified elsewhere: Principal | ICD-10-CM

## 2021-09-18 DIAGNOSIS — J329 Chronic sinusitis, unspecified: Principal | ICD-10-CM

## 2021-09-18 MED ORDER — AMOXICILLIN 875 MG-POTASSIUM CLAVULANATE 125 MG TABLET
ORAL_TABLET | Freq: Two times a day (BID) | ORAL | 0 refills | 10 days | Status: CP
Start: 2021-09-18 — End: 2021-09-28

## 2021-09-18 MED ORDER — CODEINE 10 MG-GUAIFENESIN 100 MG/5 ML ORAL LIQUID
Freq: Three times a day (TID) | ORAL | 0 refills | 8.00000 days | Status: CP | PRN
Start: 2021-09-18 — End: ?

## 2021-09-19 ENCOUNTER — Other Ambulatory Visit: Payer: Self-pay | Admitting: Interventional Cardiology

## 2021-09-19 DIAGNOSIS — R059 Cough, unspecified type: Principal | ICD-10-CM

## 2021-09-19 MED ORDER — CODEINE 10 MG-GUAIFENESIN 100 MG/5 ML ORAL LIQUID
Freq: Three times a day (TID) | ORAL | 0 refills | 8.00000 days | Status: CP | PRN
Start: 2021-09-19 — End: ?

## 2021-09-19 MED ORDER — TESTOSTERONE 20.25 MG/1.25 GRAM (1.62 %) TRANSDERMAL GEL PUMP
Freq: Every day | TRANSDERMAL | 1 refills | 0 days | Status: CP
Start: 2021-09-19 — End: ?

## 2021-10-27 DIAGNOSIS — C9210 Chronic myeloid leukemia, BCR/ABL-positive, not having achieved remission: Principal | ICD-10-CM

## 2021-11-22 ENCOUNTER — Ambulatory Visit: Admit: 2021-11-22 | Discharge: 2021-11-23 | Payer: MEDICARE

## 2021-11-22 DIAGNOSIS — E349 Endocrine disorder, unspecified: Principal | ICD-10-CM

## 2021-11-22 DIAGNOSIS — J189 Pneumonia, unspecified organism: Principal | ICD-10-CM

## 2021-11-22 DIAGNOSIS — R059 Cough, unspecified type: Principal | ICD-10-CM

## 2021-11-22 DIAGNOSIS — R7989 Other specified abnormal findings of blood chemistry: Principal | ICD-10-CM

## 2021-11-22 DIAGNOSIS — C9210 Chronic myeloid leukemia, BCR/ABL-positive, not having achieved remission: Principal | ICD-10-CM

## 2021-11-29 ENCOUNTER — Ambulatory Visit: Admit: 2021-11-29 | Discharge: 2021-11-30 | Payer: MEDICARE

## 2021-11-30 ENCOUNTER — Ambulatory Visit: Admit: 2021-11-30 | Discharge: 2021-12-01 | Payer: MEDICARE

## 2021-12-06 DIAGNOSIS — C9210 Chronic myeloid leukemia, BCR/ABL-positive, not having achieved remission: Principal | ICD-10-CM

## 2021-12-06 MED ORDER — IMATINIB 400 MG TABLET
ORAL_TABLET | Freq: Every day | ORAL | 3 refills | 30 days
Start: 2021-12-06 — End: ?

## 2021-12-08 ENCOUNTER — Ambulatory Visit: Admit: 2021-12-08 | Discharge: 2021-12-09 | Payer: MEDICARE

## 2021-12-15 ENCOUNTER — Institutional Professional Consult (permissible substitution): Admit: 2021-12-15 | Discharge: 2021-12-16 | Payer: MEDICARE

## 2021-12-27 ENCOUNTER — Ambulatory Visit: Admit: 2021-12-27 | Discharge: 2021-12-28 | Payer: MEDICARE

## 2021-12-27 DIAGNOSIS — C9210 Chronic myeloid leukemia, BCR/ABL-positive, not having achieved remission: Principal | ICD-10-CM

## 2021-12-27 DIAGNOSIS — R059 Cough, unspecified type: Principal | ICD-10-CM

## 2021-12-27 DIAGNOSIS — R7989 Other specified abnormal findings of blood chemistry: Principal | ICD-10-CM

## 2021-12-28 ENCOUNTER — Telehealth: Payer: Self-pay | Admitting: Interventional Cardiology

## 2021-12-28 DIAGNOSIS — I5042 Chronic combined systolic (congestive) and diastolic (congestive) heart failure: Secondary | ICD-10-CM

## 2021-12-28 NOTE — Telephone Encounter (Signed)
Patient is calling requesting an order be placed for his yearly echo he needs to be able to keep driving. He is requesting a callback to schedule once the order is placed. Patient has new insurance that needs to be filled as well. Please advise.  ?

## 2021-12-28 NOTE — Telephone Encounter (Signed)
Pt has been getting echoes regularly for DOT.  Last one was May 2022.  Ok to order? ?

## 2021-12-29 NOTE — Telephone Encounter (Signed)
Called pt and left message that order had been placed and scheduling would be in contact with an appt.  ?

## 2022-01-01 DIAGNOSIS — C9210 Chronic myeloid leukemia, BCR/ABL-positive, not having achieved remission: Principal | ICD-10-CM

## 2022-01-05 ENCOUNTER — Institutional Professional Consult (permissible substitution): Admit: 2022-01-05 | Discharge: 2022-01-06 | Payer: MEDICARE

## 2022-01-25 DIAGNOSIS — C9210 Chronic myeloid leukemia, BCR/ABL-positive, not having achieved remission: Principal | ICD-10-CM

## 2022-01-25 NOTE — Unmapped (Signed)
[facility] Specialty Medication Onboarding    Specialty Medication: Imatinib  Prior Authorization: Not Required   Financial Assistance: No - copay card or gant not available   Final Copay/Day Supply: $31.02 / 30    Insurance Restrictions: None     Notes to Pharmacist:     The triage team has completed the benefits investigation and has determined that the patient is able to fill this medication at [facility]. Please contact the patient to complete the onboarding or follow up with the prescribing physician as needed.

## 2022-01-29 ENCOUNTER — Ambulatory Visit (HOSPITAL_COMMUNITY): Payer: Managed Care, Other (non HMO) | Attending: Internal Medicine

## 2022-01-29 DIAGNOSIS — I5042 Chronic combined systolic (congestive) and diastolic (congestive) heart failure: Secondary | ICD-10-CM | POA: Insufficient documentation

## 2022-01-29 LAB — ECHOCARDIOGRAM COMPLETE
Area-P 1/2: 3.27 cm2
S' Lateral: 4.1 cm

## 2022-02-02 ENCOUNTER — Institutional Professional Consult (permissible substitution): Admit: 2022-02-02 | Discharge: 2022-02-03 | Payer: MEDICARE

## 2022-02-02 NOTE — Unmapped (Signed)
Cycle 3 Gleevec 400mg  po daily.  Patient seen by Dr. Quintin Alto on 12/27/2021.  This patient has been on Gleevec in the past.  Patient wants to get medicine from [facility] in [place], [place]. Patient received medication on 12/09/2021 and started taking.       Next tx due: 03/02/2022  Next MD appt: 06/27/2022                          NAME: Jorge Serrano ([birth year])  ACCT# [id number]               REGIMEN:  Gleevec 400mg  po daily               DIRECTIONS: Take 1 tablet po daily               START DATE: 02/02/2022 CYCLE # 3                 NEXT TREATMENT DUE: 03/02/2022                 DOCTOR: Dinardo NEXT APPT: 06/27/2022               LABS       DATE: 12/27/2021               ANC 4400               Hgb 15.4               PLT 269               SCr 1.4               TBili 0.4               AST 28               ALT 30                                                                                                                                              PHARMACY: [facility] #  [phone]        Johna Sheriff D.       REFILL DATE: 03/02/2021                         COMMENTS:  Fax : [phone]

## 2022-02-05 NOTE — Unmapped (Signed)
Specialty Medication(s): Gleevec    Jorge Serrano has been dis-enrolled from the [facility] specialty pharmacy services due to a pharmacy change. The patient is now filling at previous pharmacy [facility] specialty.    Additional information provided to the patient:      Jorge Serrano Jorge Serrano  Jorge Serrano - [place] Specialty Pharmacist

## 2022-02-09 ENCOUNTER — Encounter: Payer: Self-pay | Admitting: Interventional Cardiology

## 2022-02-09 ENCOUNTER — Telehealth: Payer: Self-pay

## 2022-02-09 NOTE — Telephone Encounter (Signed)
Attempted to contact patient to schedule office visit with Dr. Katrinka Blazing (was due April 2023). Left message to return call to schedule appointment. ?

## 2022-03-02 ENCOUNTER — Institutional Professional Consult (permissible substitution): Admit: 2022-03-02 | Discharge: 2022-03-03 | Payer: MEDICARE

## 2022-03-06 DIAGNOSIS — C9210 Chronic myeloid leukemia, BCR/ABL-positive, not having achieved remission: Principal | ICD-10-CM

## 2022-03-06 MED ORDER — IMATINIB 400 MG TABLET
ORAL_TABLET | Freq: Every day | ORAL | 3 refills | 30.00 days | Status: CP
Start: 2022-03-06 — End: ?

## 2022-03-30 ENCOUNTER — Institutional Professional Consult (permissible substitution): Admit: 2022-03-30 | Payer: MEDICARE

## 2022-04-08 ENCOUNTER — Other Ambulatory Visit: Payer: Self-pay | Admitting: Interventional Cardiology

## 2022-04-11 ENCOUNTER — Institutional Professional Consult (permissible substitution): Admit: 2022-04-11 | Discharge: 2022-04-12 | Payer: MEDICARE

## 2022-04-27 ENCOUNTER — Institutional Professional Consult (permissible substitution): Admit: 2022-04-27 | Discharge: 2022-04-28 | Payer: MEDICARE

## 2022-05-06 NOTE — Progress Notes (Unsigned)
Cardiology Office Note:    Date:  05/07/2022   ID:  [name], DOB August 24, 1961, MRN [id number]  PCP:  Fransisca Connors, PA-C  Cardiologist:  Sinclair Grooms, MD   Referring MD: Fransisca Connors, PA-C   Chief Complaint  Patient presents with   Congestive Heart Failure   Hypertension   Follow-up    Obstructive sleep apnea but not compliant with CPAP    History of Present Illness:    [name] is a 61 y.o. male with a hx of combined systolic and diastolic HF, NICM with EF 20-25 2018 to 45% 2019 on GDMT (BB, ACE, and MRA), normal coronary arteries 7/18, obstructive sleep apnea and CPAP (2018).  Needs med refills and has not been seen > 12 months.  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  He gets no real physical activity.  He does not sleep as well as he should.  He was in heart failure when first met with EF in the 20 to 25% range.  Therapy was started to control blood pressure and was based on guideline therapy for systolic heart failure.  He had recovery in LV function to low normal based upon the most recent echocardiogram noted below.  Now, compared to first presentation, he is able to lie flat, has no peripheral edema, and has no significant dyspnea on exertion.  Physical activity is limited by back and joint discomfort.  He is in today because he needs medication refill.  He has had no recent laboratory data.  He does not have a primary care physician.  He is not taking very good care of himself.  This is because his job does not allow time to do the things he needs to do.  His diet is mostly fast food.  Past Medical History:  Diagnosis Date   Arthritis    "knees" (06/28/2017)   Chronic combined systolic and diastolic heart failure ([place]) 04/19/2017   a. LHC 04/22/17 - Normal coronary arteries; LVEDP 33 // Echo 04/21/17 - Mild LVH, EF 20-25, diff HK, restrictive physiology, mod to severe LAE, mild RVE, mild RAE, PASP 35.// Echo 11/18: Moderate concentric  LVH, EF 35-40, diffuse HK, grade 1 diastolic dysfunction, mild LAE, mild RVE    History of gout    Migraine    "I had 1 in the 1990s" (06/28/2017)   NICM (nonischemic cardiomyopathy) ([place]) 04/23/2017   Obesity    Sleep apnea 04/23/2017    Past Surgical History:  Procedure Laterality Date   CARDIAC CATHETERIZATION  04/2017   patent coronary arteries/medical hx   KNEE ARTHROSCOPY Left ~ 2008   LEFT HEART CATH AND CORONARY ANGIOGRAPHY [name] 04/22/2017   Procedure: Left Heart Cath and Coronary Angiography;  Surgeon: Belva Crome, MD;  Location: [facility];  Service: Cardiovascular;  Laterality: [name];    Current Medications: Current Meds  Medication Sig   aspirin 81 MG chewable tablet Chew 1 tablet (81 mg total) by mouth daily.   atorvastatin (LIPITOR) 40 MG tablet TAKE 1 TABLET BY MOUTH ONCE DAILY AT 6 PM   furosemide (LASIX) 40 MG tablet Take 2 tablets by mouth twice daily   lisinopril (ZESTRIL) 10 MG tablet Take 1 tablet by mouth once daily   metoprolol succinate (TOPROL-XL) 50 MG 24 hr tablet TAKE 1 TABLET BY MOUTH ONCE DAILY ( TAKE WITH OR IMMEDIATELY FOLLOWING A MEAL )   potassium chloride (KLOR-CON) 10 MEQ tablet Take 2 tablets (20  mEq total) by mouth 2 (two) times daily.   spironolactone (ALDACTONE) 25 MG tablet TAKE 1/2 TABLET(12.5 MG) BY MOUTH DAILY     Allergies:   Patient has no known allergies.   Social History   Socioeconomic History   Marital status: Married    Spouse name: Not on file   Number of children: Not on file   Years of education: Not on file   Highest education level: Not on file  Occupational History   Not on file  Tobacco Use   Smoking status: Never   Smokeless tobacco: Never  Vaping Use   Vaping Use: Never used  Substance and Sexual Activity   Alcohol use: No   Drug use: No   Sexual activity: Not Currently  Other Topics Concern   Not on file  Social History Narrative   Not on file   Social Determinants of Health   Financial Resource  Strain: Not on file  Food Insecurity: Not on file  Transportation Needs: Not on file  Physical Activity: Not on file  Stress: Not on file  Social Connections: Not on file     Family History: The patient's family history is not on file.  ROS:   Please see the history of present illness.    Does not have primary care.  Has had some blood in his urine that he feels is related to "kidney stone" which was not a confirmed diagnosis.  This is happened on a couple occasions.  All other systems reviewed and are negative.  EKGs/Labs/Other Studies Reviewed:    The following studies were reviewed today:  2 D Doppler ECHOCARDIOGRAM 2023: IMPRESSIONS    1. Poor acoustic windows limit study. COmpared to echo from 2022, LVEF is  without significant change.   2. Left ventricular ejection fraction, by estimation, is 50 to 55%. The  left ventricle has low normal function. The left ventricle has no regional  wall motion abnormalities. The left ventricular internal cavity size was  mildly dilated.   3. Right ventricular systolic function is normal. The right ventricular  size is normal.   4. The mitral valve is grossly normal. Trivial mitral valve  regurgitation.   5. The aortic valve is tricuspid. Aortic valve regurgitation is not  visualized. Aortic valve sclerosis is present, with no evidence of aortic  valve stenosis.   EKG:  EKG sinus bradycardia, left axis deviation, poor R wave progression V1 through V4, low voltage.  When compared to the tracing performed February 03, 2021, the heart rate is slower on the current EKG and otherwise unchanged.  Recent Labs: No results found for requested labs within last 365 days.  Recent Lipid Panel    Component Value Date/Time   CHOL 136 02/01/2020 0738   TRIG 80 02/01/2020 0738   HDL 42 02/01/2020 0738   CHOLHDL 3.2 02/01/2020 0738   CHOLHDL 3.9 04/20/2017 0224   VLDL 18 04/20/2017 0224   LDLCALC 78 02/01/2020 0738    Physical Exam:    VS:  BP  116/70   Pulse (!) 53   Ht _0  (1.778 m)   Wt 288 lb 3.2 oz (130.7 kg)   SpO2 95%   BMI 41.35 kg/m     Wt Readings from Last 3 Encounters:  05/07/22 288 lb 3.2 oz (130.7 kg)  06/16/21 296 lb (134.3 kg)  02/02/21 296 lb 12.8 oz (134.6 kg)     GEN: Morbid. No acute distress HEENT: Normal NECK: No JVD. LYMPHATICS: No lymphadenopathy  CARDIAC: No murmur. RRR S4 gallop, or edema. VASCULAR:  Normal Pulses. No bruits. RESPIRATORY:  Clear to auscultation without rales, wheezing or rhonchi  ABDOMEN: Soft, non-tender, non-distended, No pulsatile mass, MUSCULOSKELETAL: No deformity  SKIN: Warm and dry NEUROLOGIC:  Alert and oriented x 3 PSYCHIATRIC:  Normal affect   ASSESSMENT:    1. Chronic combined systolic and diastolic heart failure ([place])   2. Essential hypertension   3. OSA (obstructive sleep apnea)   4. NSVT (nonsustained ventricular tachycardia) (HCC)    PLAN:    In order of problems listed above:  Continue guideline directed therapy including lisinopril, metoprolol succinate, and spironolactone.  He is not on an updated medication list to include SGLT2 or ARNI because of cost.  With normal LV function, we will continue the current therapy.  See echo report above. Currently well controlled.  I rechecked the blood pressure and is 132/80 mmHg.  Low-salt diet recommended along with medications listed above. Encouraged that he use CPAP. No palpitations or symptoms to suggest significant arrhythmia.  Guideline directed therapy for left ventricular systolic dysfunction: Angiotensin receptor-neprilysin inhibitor (ARNI)-Entresto; beta-blocker therapy - carvedilol, metoprolol succinate, or bisoprolol; mineralocorticoid receptor antagonist (MRA) therapy -spironolactone or eplerenone.  SGLT-2 agents -  Dapagliflozin [place]) or Empagliflozin (Jardiance).These therapies have been shown to improve clinical outcomes including reduction of rehospitalization, survival, and acute heart  failure.  Laboratory data including a c-Met, hemoglobin, hemoglobin A1c, and lipid panel will be performed today.  He is fasting.  Encouraged establishment with a primary care physician.  Needs 1 year cardiology follow-up.  He will be assigned a cardiologist at that time.  Medication Adjustments/Labs and Tests Ordered: Current medicines are reviewed at length with the patient today.  Concerns regarding medicines are outlined above.  Orders Placed This Encounter  Procedures   CBC   Comprehensive metabolic panel   Lipid panel   Hemoglobin A1c   EKG 12-Lead   No orders of the defined types were placed in this encounter.   Patient Instructions  Medication Instructions:  Your physician recommends that you continue on your current medications as directed. Please refer to the Current Medication list given to you today.  *If you need a refill on your cardiac medications before your next appointment, please call your pharmacy*  Lab Work: TODAY: CMET, Lipid panel, Hgb A1c, CBC If you have labs (blood work) drawn today and your tests are completely normal, you will receive your results only by: [place] (if you have MyChart) OR A paper copy in the mail If you have any lab test that is abnormal or we need to change your treatment, we will call you to review the results.  Testing/Procedures: NONE  Follow-Up: At [facility], you and your health needs are our priority.  As part of our continuing mission to provide you with exceptional heart care, we have created designated Provider Care Teams.  These Care Teams include your primary Cardiologist (physician) and Advanced Practice Providers (APPs -  Physician Assistants and Nurse Practitioners) who all work together to provide you with the care you need, when you need it.   Your next appointment:   1 year(s)  The format for your next appointment:   In Person  Provider:   Sinclair Grooms, MD {  Important Information About  [place]         Signed, Sinclair Grooms, MD  05/07/2022 10:15 AM    [place]

## 2022-05-07 ENCOUNTER — Ambulatory Visit (INDEPENDENT_AMBULATORY_CARE_PROVIDER_SITE_OTHER): Payer: Managed Care, Other (non HMO) | Admitting: Interventional Cardiology

## 2022-05-07 ENCOUNTER — Encounter: Payer: Self-pay | Admitting: Interventional Cardiology

## 2022-05-07 VITALS — BP 116/70 | HR 53 | Ht 70.0 in | Wt 288.2 lb

## 2022-05-07 DIAGNOSIS — I5042 Chronic combined systolic (congestive) and diastolic (congestive) heart failure: Secondary | ICD-10-CM | POA: Diagnosis not present

## 2022-05-07 DIAGNOSIS — I4729 Other ventricular tachycardia: Secondary | ICD-10-CM | POA: Diagnosis not present

## 2022-05-07 DIAGNOSIS — G4733 Obstructive sleep apnea (adult) (pediatric): Secondary | ICD-10-CM | POA: Diagnosis not present

## 2022-05-07 DIAGNOSIS — I10 Essential (primary) hypertension: Secondary | ICD-10-CM

## 2022-05-07 LAB — LIPID PANEL
Chol/HDL Ratio: 3.3 ratio (ref 0.0–5.0)
Cholesterol, Total: 130 mg/dL (ref 100–199)
HDL: 40 mg/dL (ref >39)
LDL Chol Calc (NIH): 74 mg/dL (ref 0–99)
Triglycerides: 80 mg/dL (ref 0–149)
VLDL Cholesterol Cal: 16 mg/dL (ref 5–40)

## 2022-05-07 LAB — COMPREHENSIVE METABOLIC PANEL
ALT: 16 IU/L (ref 0–44)
AST: 21 IU/L (ref 0–40)
Albumin/Globulin Ratio: 1.5 (ref 1.2–2.2)
Albumin: 4.1 g/dL (ref 3.8–4.9)
Alkaline Phosphatase: 96 IU/L (ref 44–121)
BUN/Creatinine Ratio: 10 (ref 10–24)
BUN: 12 mg/dL (ref 8–27)
Bilirubin Total: 0.7 mg/dL (ref 0.0–1.2)
CO2: 22 mmol/L (ref 20–29)
Calcium: 9.3 mg/dL (ref 8.6–10.2)
Chloride: 107 mmol/L — ABNORMAL HIGH (ref 96–106)
Creatinine, Ser: 1.16 mg/dL (ref 0.76–1.27)
Globulin, Total: 2.8 g/dL (ref 1.5–4.5)
Glucose: 98 mg/dL (ref 70–99)
Potassium: 3.8 mmol/L (ref 3.5–5.2)
Sodium: 141 mmol/L (ref 134–144)
Total Protein: 6.9 g/dL (ref 6.0–8.5)
eGFR: 72 mL/min/{1.73_m2} (ref >59)

## 2022-05-07 LAB — HEMOGLOBIN A1C
Est. average glucose Bld gHb Est-mCnc: 137 mg/dL
Hgb A1c MFr Bld: 6.4 % — ABNORMAL HIGH (ref 4.8–5.6)

## 2022-05-07 LAB — CBC
Hematocrit: 37 % — ABNORMAL LOW (ref 37.5–51.0)
Hemoglobin: 12.9 g/dL — ABNORMAL LOW (ref 13.0–17.7)
MCH: 30.4 pg (ref 26.6–33.0)
MCHC: 34.9 g/dL (ref 31.5–35.7)
MCV: 87 fL (ref 79–97)
Platelets: 248 10*3/uL (ref 150–450)
RBC: 4.25 x10E6/uL (ref 4.14–5.80)
RDW: 12.1 % (ref 11.6–15.4)
WBC: 5.5 10*3/uL (ref 3.4–10.8)

## 2022-05-07 NOTE — Patient Instructions (Signed)
Medication Instructions:  Your physician recommends that you continue on your current medications as directed. Please refer to the Current Medication list given to you today.  *If you need a refill on your cardiac medications before your next appointment, please call your pharmacy*  Lab Work: TODAY: CMET, Lipid panel, Hgb A1c, CBC If you have labs (blood work) drawn today and your tests are completely normal, you will receive your results only by: MyChart Message (if you have MyChart) OR A paper copy in the mail If you have any lab test that is abnormal or we need to change your treatment, we will call you to review the results.  Testing/Procedures: NONE  Follow-Up: At [facility], you and your health needs are our priority.  As part of our continuing mission to provide you with exceptional heart care, we have created designated Provider Care Teams.  These Care Teams include your primary Cardiologist (physician) and Advanced Practice Providers (APPs -  Physician Assistants and Nurse Practitioners) who all work together to provide you with the care you need, when you need it.   Your next appointment:   1 year(s)  The format for your next appointment:   In Person  Provider:   Lesleigh Noe, MD {  Important Information About [place]

## 2022-05-25 ENCOUNTER — Institutional Professional Consult (permissible substitution): Admit: 2022-05-25 | Discharge: 2022-05-26 | Payer: MEDICARE

## 2022-06-08 DIAGNOSIS — C9210 Chronic myeloid leukemia, BCR/ABL-positive, not having achieved remission: Principal | ICD-10-CM

## 2022-06-22 ENCOUNTER — Institutional Professional Consult (permissible substitution): Admit: 2022-06-22 | Discharge: 2022-06-23 | Payer: MEDICARE

## 2022-06-27 ENCOUNTER — Ambulatory Visit: Admit: 2022-06-27 | Discharge: 2022-06-27 | Payer: MEDICARE

## 2022-06-27 ENCOUNTER — Encounter: Admit: 2022-06-27 | Discharge: 2022-06-27 | Payer: MEDICARE

## 2022-06-27 DIAGNOSIS — M459 Ankylosing spondylitis of unspecified sites in spine: Principal | ICD-10-CM

## 2022-06-27 DIAGNOSIS — C9210 Chronic myeloid leukemia, BCR/ABL-positive, not having achieved remission: Principal | ICD-10-CM

## 2022-06-28 ENCOUNTER — Encounter: Admit: 2022-06-28 | Discharge: 2022-06-28 | Payer: MEDICARE

## 2022-07-20 ENCOUNTER — Ambulatory Visit: Admit: 2022-07-20 | Payer: MEDICARE

## 2022-07-21 ENCOUNTER — Other Ambulatory Visit: Payer: Self-pay | Admitting: Interventional Cardiology

## 2022-07-24 ENCOUNTER — Institutional Professional Consult (permissible substitution): Admit: 2022-07-24 | Discharge: 2022-07-25 | Payer: MEDICARE

## 2022-07-24 DIAGNOSIS — C9210 Chronic myeloid leukemia, BCR/ABL-positive, not having achieved remission: Principal | ICD-10-CM

## 2022-08-11 ENCOUNTER — Other Ambulatory Visit: Payer: Self-pay | Admitting: Interventional Cardiology

## 2022-08-17 ENCOUNTER — Institutional Professional Consult (permissible substitution): Admit: 2022-08-17 | Discharge: 2022-08-18 | Payer: MEDICARE

## 2022-08-17 DIAGNOSIS — C9210 Chronic myeloid leukemia, BCR/ABL-positive, not having achieved remission: Principal | ICD-10-CM

## 2022-09-10 ENCOUNTER — Other Ambulatory Visit: Payer: Self-pay | Admitting: *Deleted

## 2022-09-10 MED ORDER — SPIRONOLACTONE 25 MG PO TABS: ORAL_TABLET | ORAL | 2 refills | Status: DC

## 2022-09-14 ENCOUNTER — Institutional Professional Consult (permissible substitution): Admit: 2022-09-14 | Discharge: 2022-09-15 | Payer: MEDICARE

## 2022-09-14 DIAGNOSIS — C9210 Chronic myeloid leukemia, BCR/ABL-positive, not having achieved remission: Principal | ICD-10-CM

## 2022-10-07 ENCOUNTER — Ambulatory Visit (HOSPITAL_COMMUNITY)
Admission: EM | Admit: 2022-10-07 | Discharge: 2022-10-07 | Disposition: A | Discharge status: Home / Self Care | Payer: Managed Care, Other (non HMO) | Attending: Emergency Medicine | Admitting: Emergency Medicine

## 2022-10-07 ENCOUNTER — Encounter (HOSPITAL_COMMUNITY): Payer: Self-pay

## 2022-10-07 DIAGNOSIS — M109 Gout, unspecified: Secondary | ICD-10-CM | POA: Diagnosis not present

## 2022-10-07 MED ORDER — PREDNISONE 10 MG PO TABS: ORAL_TABLET | ORAL | 0 refills | Status: DC

## 2022-10-07 MED ORDER — PREDNISONE 20 MG PO TABS: 40.0000 mg | ORAL_TABLET | Freq: Every day | ORAL | 0 refills | Status: DC

## 2022-10-07 NOTE — ED Triage Notes (Signed)
Pt is here for right foot pain and swelling and finger on the right hand pain and swelling x 5days

## 2022-10-07 NOTE — ED Provider Notes (Signed)
[facility]    CSN: [id number] Arrival date & time: 10/07/22  1647      History   Chief Complaint Chief Complaint  Patient presents with   Foot Pain    HPI [name] is a 61 y.o. male.  Patient presents with right foot pain and left hand pain that started 5 days ago.  He denies any trauma to his right foot or left hand.  He reports that the symptoms are similar to her previous gout flare.  He reports pain with ambulation.  He states that his symptoms have progressively worsened. He has not taken any medications for his symptoms.  He states that he was previously treated for gout 1 month ago, he does not recall what he was given previously.    Foot Pain    Past Medical History:  Diagnosis Date   Arthritis    "knees" (06/28/2017)   Chronic combined systolic and diastolic heart failure (HCC) 04/19/2017   a. LHC 04/22/17 - Normal coronary arteries; LVEDP 33 // Echo 04/21/17 - Mild LVH, EF 20-25, diff HK, restrictive physiology, mod to severe LAE, mild RVE, mild RAE, PASP 35.// Echo 11/18: Moderate concentric LVH, EF 35-40, diffuse HK, grade 1 diastolic dysfunction, mild LAE, mild RVE    History of gout    Migraine    "I had 1 in the 1990s" (06/28/2017)   NICM (nonischemic cardiomyopathy) (HCC) 04/23/2017   Obesity    Sleep apnea 04/23/2017    Patient Active Problem List   Diagnosis Date Noted   Enteritis 06/26/2017   NICM (nonischemic cardiomyopathy) (HCC) 04/23/2017   S/P cardiac cath, patent coronary arteries 04/22/17 04/23/2017   Sleep apnea 04/23/2017   Hypertension 04/20/2017   Premature atrial beats 04/20/2017   NSVT (nonsustained ventricular tachycardia) (HCC)    Chronic combined systolic and diastolic heart failure (HCC) 04/19/2017    Past Surgical History:  Procedure Laterality Date   CARDIAC CATHETERIZATION  04/2017   patent coronary arteries/medical hx   KNEE ARTHROSCOPY Left ~ 2008   LEFT HEART CATH AND CORONARY ANGIOGRAPHY [name] 04/22/2017    Procedure: Left Heart Cath and Coronary Angiography;  Surgeon: Lyn Records, MD;  Location: [facility];  Service: Cardiovascular;  Laterality: [name];       Home Medications    Prior to Admission medications   Medication Sig Start Date End Date Taking? Authorizing Provider  aspirin 81 MG chewable tablet Chew 1 tablet (81 mg total) by mouth daily. 04/24/17   Leone Brand, NP  atorvastatin (LIPITOR) 40 MG tablet Take 1 tablet (40 mg total) by mouth daily at 6 PM. 07/23/22   Lyn Records, MD  furosemide (LASIX) 40 MG tablet Take 2 tablets by mouth twice daily 09/19/21   Lyn Records, MD  lisinopril (ZESTRIL) 10 MG tablet Take 1 tablet by mouth once daily 08/13/22   Lyn Records, MD  metoprolol succinate (TOPROL-XL) 50 MG 24 hr tablet TAKE 1 TABLET BY MOUTH ONCE DAILY; TAKE WITH OR IMMEDIATELY FOLLOWING A MEAL 08/13/22   Lyn Records, MD  potassium chloride (KLOR-CON M) 10 MEQ tablet Take 2 tablets by mouth twice daily 08/13/22   Lyn Records, MD  spironolactone (ALDACTONE) 25 MG tablet TAKE 1/2 TABLET(12.5 MG) BY MOUTH DAILY 09/10/22   Lyn Records, MD    Family History History reviewed. No pertinent family history.  Social History Social History   Tobacco Use   Smoking status: Never   Smokeless  tobacco: Never  Vaping Use   Vaping Use: Never used  Substance Use Topics   Alcohol use: No   Drug use: No     Allergies   Patient has no known allergies.   Review of Systems Review of Systems   Physical Exam Triage Vital Signs ED Triage Vitals  Enc Vitals Group     BP 10/07/22 1941 136/84     Pulse Rate 10/07/22 1941 92     Resp 10/07/22 1941 12     Temp 10/07/22 1941 99.7 F (37.6 C)     Temp Source 10/07/22 1941 Oral     SpO2 10/07/22 1941 96 %     Weight --      Height --      Head Circumference --      Peak Flow --      Pain Score 10/07/22 1939 10     Pain Loc --      Pain Edu? --      Excl. in GC? --    No data found.  Updated Vital  Signs BP 136/84 (BP Location: Left Arm)   Pulse 92   Temp 99.7 F (37.6 C) (Oral)   Resp 12   SpO2 96%   Physical Exam Vitals and nursing note reviewed.  Constitutional:      Appearance: Normal appearance.  Musculoskeletal:     Left hand: Swelling and tenderness present.     Right foot: Swelling present.     Comments: Tophi noted around 5th finger.   Neurological:     Mental Status: He is alert.      UC Treatments / Results  Labs (all labs ordered are listed, but only abnormal results are displayed) Labs Reviewed - No data to display  EKG   Radiology No results found.  Procedures Procedures (including critical care time)  Medications Ordered in UC Medications - No data to display  Initial Impression / Assessment and Plan / UC Course  I have reviewed the triage vital signs and the nursing notes.  Pertinent labs & imaging results that were available during my care of the patient were reviewed by me and considered in my medical decision making (see chart for details).     Declined steroid injection in office. Last A1C. Prednisone taper.  Final Clinical Impressions(s) / UC Diagnoses   Final diagnoses:  None   Discharge Instructions   None    ED Prescriptions   None    PDMP not reviewed this encounter.

## 2022-10-07 NOTE — Discharge Instructions (Addendum)
A prednisone taper has been sent to the pharmacy, take 7 tablets on the first day, 6 tablets on the second day, 5 tablets on the third day, 4 tablets on the fourth day, 3 tablets on the fifth day, and 2 tablet on the sixth day, and 1 tablet the last day.  Follow up with your PCP or at this clinic if symptoms are not improving.

## 2022-10-12 ENCOUNTER — Institutional Professional Consult (permissible substitution): Admit: 2022-10-12 | Discharge: 2022-10-13 | Payer: MEDICARE

## 2022-10-12 DIAGNOSIS — C9210 Chronic myeloid leukemia, BCR/ABL-positive, not having achieved remission: Principal | ICD-10-CM

## 2022-10-16 ENCOUNTER — Ambulatory Visit (HOSPITAL_COMMUNITY)
Admission: EM | Admit: 2022-10-16 | Discharge: 2022-10-16 | Disposition: A | Discharge status: Home / Self Care | Payer: Managed Care, Other (non HMO) | Attending: Family Medicine | Admitting: Family Medicine

## 2022-10-16 DIAGNOSIS — I10 Essential (primary) hypertension: Secondary | ICD-10-CM | POA: Diagnosis not present

## 2022-10-16 DIAGNOSIS — M109 Gout, unspecified: Secondary | ICD-10-CM | POA: Diagnosis not present

## 2022-10-16 MED ORDER — COLCHICINE 0.6 MG PO TABS: ORAL_TABLET | ORAL | 1 refills | Status: DC

## 2022-10-16 NOTE — ED Triage Notes (Signed)
Pt is here for gout flare 3-7 days

## 2022-10-16 NOTE — Discharge Instructions (Addendum)
Your blood pressure was noted to be elevated during your visit today. If you are currently taking medication for high blood pressure, please ensure you are taking this as directed. If you do not have a history of high blood pressure and your blood pressure remains persistently elevated, you may need to begin taking a medication at some point. You may return here within the next few days to recheck if unable to see your primary care provider or if you do not have a one.  BP (!) 152/83 (BP Location: Left Arm)   Pulse 75   Temp 98.3 F (36.8 C) (Oral)   Resp 16   SpO2 95%   BP Readings from Last 3 Encounters:  10/16/22 (!) 152/83  10/07/22 136/84  05/07/22 116/70

## 2022-10-17 NOTE — ED Provider Notes (Signed)
[place]   [id number] 10/16/22 Arrival Time: 1503  ASSESSMENT & PLAN:  1. Acute gout of left hand, unspecified cause   2. Elevated blood pressure reading in office with diagnosis of hypertension    Reports 90% better after prednisone. Begin: Meds ordered this encounter  Medications   colchicine 0.6 MG tablet    Sig: Take two tablets as one dose followed one hour later by one tablet.    Dispense:  3 tablet    Refill:  1     Discharge Instructions      Your blood pressure was noted to be elevated during your visit today. If you are currently taking medication for high blood pressure, please ensure you are taking this as directed. If you do not have a history of high blood pressure and your blood pressure remains persistently elevated, you may need to begin taking a medication at some point. You may return here within the next few days to recheck if unable to see your primary care provider or if you do not have a one.  BP (!) 152/83 (BP Location: Left Arm)   Pulse 75   Temp 98.3 F (36.8 C) (Oral)   Resp 16   SpO2 95%   BP Readings from Last 3 Encounters:  10/16/22 (!) 152/83  10/07/22 136/84  05/07/22 116/70        May f/u here as needed if gout does not improve. Recommend rechecking BP here later this week or next.   Reviewed expectations re: course of current medical issues. Questions answered. Outlined signs and symptoms indicating need for more acute intervention. Understanding verbalized. After Visit Summary given.   SUBJECTIVE: History from: Patient. [name] is a 62 y.o. male. Reports: Pt is here for gout flare 3-7 days . Finished prednisone a couple of days ago and reports "I'm 90% better but still feel some pain". Long h/o gout. Denies: fever. Normal PO intake without n/v/d.  Elevated BP noted. Is treated for HTN. He reports taking medications as instructed, no chest pain on exertion, no dyspnea on exertion, no orthostatic  dizziness or lightheadedness, and no orthopnea or paroxysmal nocturnal dyspnea.   OBJECTIVE:  Vitals:   10/16/22 1648 10/16/22 1747  BP: (!) 152/83 (!) 142/87  Pulse: 75   Resp: 16   Temp: 98.3 F (36.8 C)   TempSrc: Oral   SpO2: 95%     General appearance: alert; no distress Extremities: mild TTP and swelling over L PIP 5th finger (his main complaint) with normal capillary refill and ROM Skin: warm and dry Neurologic: normal distal sensation of L 5th finger Psychological: alert and cooperative; normal mood and affect   No Known Allergies  Past Medical History:  Diagnosis Date   Arthritis    "knees" (06/28/2017)   Chronic combined systolic and diastolic heart failure ([place]) 04/19/2017   a. LHC 04/22/17 - Normal coronary arteries; LVEDP 33 // Echo 04/21/17 - Mild LVH, EF 20-25, diff HK, restrictive physiology, mod to severe LAE, mild RVE, mild RAE, PASP 35.// Echo 11/18: Moderate concentric LVH, EF 35-40, diffuse HK, grade 1 diastolic dysfunction, mild LAE, mild RVE    History of gout    Migraine    "I had 1 in the 1990s" (06/28/2017)   NICM (nonischemic cardiomyopathy) ([place]) 04/23/2017   Obesity    Sleep apnea 04/23/2017   Social History   Socioeconomic History   Marital status: Married    Spouse name: Not on file   Number of  children: Not on file   Years of education: Not on file   Highest education level: Not on file  Occupational History   Not on file  Tobacco Use   Smoking status: Never   Smokeless tobacco: Never  Vaping Use   Vaping Use: Never used  Substance and Sexual Activity   Alcohol use: No   Drug use: No   Sexual activity: Not Currently  Other Topics Concern   Not on file  Social History Narrative   Not on file   Social Determinants of Health   Financial Resource Strain: Not on file  Food Insecurity: Not on file  Transportation Needs: Not on file  Physical Activity: Not on file  Stress: Not on file  Social Connections: Not on file  Intimate  Partner Violence: Not on file   No family history on file. Past Surgical History:  Procedure Laterality Date   CARDIAC CATHETERIZATION  04/2017   patent coronary arteries/medical hx   KNEE ARTHROSCOPY Left ~ 2008   LEFT HEART CATH AND CORONARY ANGIOGRAPHY [name] 04/22/2017   Procedure: Left Heart Cath and Coronary Angiography;  Surgeon: Belva Crome, MD;  Location: [facility] [facility];  Service: Cardiovascular;  Laterality: [name];     Vanessa Kick, MD 10/17/22 1302

## 2022-11-06 ENCOUNTER — Other Ambulatory Visit: Payer: Self-pay

## 2022-11-06 MED ORDER — FUROSEMIDE 40 MG PO TABS: 80.0000 mg | ORAL_TABLET | Freq: Two times a day (BID) | ORAL | 1 refills | Status: DC

## 2022-11-09 ENCOUNTER — Institutional Professional Consult (permissible substitution): Admit: 2022-11-09 | Discharge: 2022-11-10 | Payer: MEDICARE

## 2022-12-07 ENCOUNTER — Institutional Professional Consult (permissible substitution): Admit: 2022-12-07 | Discharge: 2022-12-08 | Payer: MEDICARE

## 2022-12-07 DIAGNOSIS — C9210 Chronic myeloid leukemia, BCR/ABL-positive, not having achieved remission: Principal | ICD-10-CM

## 2022-12-10 DIAGNOSIS — C9210 Chronic myeloid leukemia, BCR/ABL-positive, not having achieved remission: Principal | ICD-10-CM

## 2022-12-10 MED ORDER — IMATINIB 400 MG TABLET
ORAL_TABLET | Freq: Every day | ORAL | 5 refills | 30.00 days | Status: CP
Start: 2022-12-10 — End: ?

## 2022-12-23 DIAGNOSIS — C9210 Chronic myeloid leukemia, BCR/ABL-positive, not having achieved remission: Principal | ICD-10-CM

## 2022-12-26 ENCOUNTER — Ambulatory Visit: Admit: 2022-12-26 | Payer: MEDICARE

## 2022-12-27 ENCOUNTER — Telehealth: Payer: Self-pay | Admitting: Cardiology

## 2022-12-27 DIAGNOSIS — I5042 Chronic combined systolic (congestive) and diastolic (congestive) heart failure: Secondary | ICD-10-CM

## 2022-12-27 DIAGNOSIS — I4729 Other ventricular tachycardia: Secondary | ICD-10-CM

## 2022-12-27 NOTE — Telephone Encounter (Signed)
Pt is calling about needing an EKG for his job, before the beginning of May. Pt would like an active request to be able to schedule his EKG for his job. Pt would like a callback about this matter. Please advise

## 2022-12-27 NOTE — Telephone Encounter (Signed)
Pt made his yearly OV with Dr Radford Pax for 04/2023... old Dr Tamala Julian pt but saw Dr Radford Pax years ago and asked to have her be his General Cardiologist.   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   Will forward to Dr Radford Pax to see if okay to place the order.

## 2022-12-28 NOTE — Telephone Encounter (Signed)
Spoke with patient to let him know order for ECHO was placed and someone would be calling to schedule it.

## 2023-01-04 ENCOUNTER — Ambulatory Visit: Admit: 2023-01-04 | Payer: MEDICARE

## 2023-01-09 ENCOUNTER — Institutional Professional Consult (permissible substitution): Admit: 2023-01-09 | Discharge: 2023-01-10 | Payer: MEDICARE

## 2023-01-09 DIAGNOSIS — C9210 Chronic myeloid leukemia, BCR/ABL-positive, not having achieved remission: Principal | ICD-10-CM

## 2023-01-16 ENCOUNTER — Ambulatory Visit: Admit: 2023-01-16 | Discharge: 2023-01-17 | Payer: MEDICARE

## 2023-01-16 ENCOUNTER — Encounter: Admit: 2023-01-16 | Discharge: 2023-01-17 | Payer: MEDICARE

## 2023-01-16 DIAGNOSIS — C9210 Chronic myeloid leukemia, BCR/ABL-positive, not having achieved remission: Principal | ICD-10-CM

## 2023-01-17 ENCOUNTER — Encounter: Admit: 2023-01-17 | Discharge: 2023-01-17 | Payer: MEDICARE

## 2023-01-23 DIAGNOSIS — M542 Cervicalgia: Principal | ICD-10-CM

## 2023-01-28 ENCOUNTER — Encounter: Payer: Self-pay | Admitting: Nurse Practitioner

## 2023-01-28 ENCOUNTER — Ambulatory Visit (HOSPITAL_COMMUNITY): Payer: Managed Care, Other (non HMO) | Attending: Internal Medicine

## 2023-01-28 ENCOUNTER — Ambulatory Visit: Payer: Managed Care, Other (non HMO) | Admitting: Nurse Practitioner

## 2023-01-28 VITALS — BP 118/74 | HR 75 | Temp 97.6°F | Ht 70.5 in | Wt 260.2 lb

## 2023-01-28 DIAGNOSIS — M1A09X0 Idiopathic chronic gout, multiple sites, without tophus (tophi): Secondary | ICD-10-CM

## 2023-01-28 DIAGNOSIS — I5042 Chronic combined systolic (congestive) and diastolic (congestive) heart failure: Secondary | ICD-10-CM | POA: Insufficient documentation

## 2023-01-28 DIAGNOSIS — G4733 Obstructive sleep apnea (adult) (pediatric): Secondary | ICD-10-CM

## 2023-01-28 DIAGNOSIS — I10 Essential (primary) hypertension: Secondary | ICD-10-CM | POA: Diagnosis not present

## 2023-01-28 DIAGNOSIS — I5030 Unspecified diastolic (congestive) heart failure: Secondary | ICD-10-CM | POA: Diagnosis not present

## 2023-01-28 DIAGNOSIS — I517 Cardiomegaly: Secondary | ICD-10-CM | POA: Diagnosis not present

## 2023-01-28 DIAGNOSIS — I4729 Other ventricular tachycardia: Secondary | ICD-10-CM | POA: Diagnosis not present

## 2023-01-28 LAB — ECHOCARDIOGRAM COMPLETE
Area-P 1/2: 2.76 cm2
S' Lateral: 4.7 cm

## 2023-01-28 NOTE — Patient Instructions (Signed)
It was great to see you!  You can take tylenol as needed for the hip pain.  I have attached some stretches to do daily.   You can use ice/heat as needed   You can take turmeric or glucosamine chondroitin supplements for joint pain   Let's follow-up in 6 months, sooner if you have concerns.  If a referral was placed today, you will be contacted for an appointment. Please note that routine referrals can sometimes take up to 3-4 weeks to process. Please call our office if you haven't heard anything after this time frame.  Take care,  Rodman Pickle, NP

## 2023-01-28 NOTE — Assessment & Plan Note (Signed)
Chronic, stable.  He uses colchicine with flareups.  Currently stable.

## 2023-01-28 NOTE — Assessment & Plan Note (Signed)
Continue wearing CPAP at night. 

## 2023-01-28 NOTE — Progress Notes (Signed)
New Patient Visit  BP 118/74 (BP Location: Right Arm, Cuff Size: Large)   Pulse 75   Temp 97.6 F (36.4 C)   Ht 5' 10.5" (1.791 m)   Wt 260 lb 3.2 oz (118 kg)   SpO2 96%   BMI 36.81 kg/m    Subjective:    Patient ID: IGNATIUS KLOOS, male    DOB: October 27, 1960, 62 y.o.   MRN: [id number]  CC: Chief Complaint  Patient presents with   Establish Care    NP. Est. Care, not taken medication since 12/23    HPI: BERTHA EARWOOD is a 62 y.o. male presents for new patient visit to establish care.  Introduced to Publishing rights manager role and practice setting.  All questions answered.  Discussed provider/patient relationship and expectations.  He has a history of combined systolic and diastolic heart failure that was diagnosed in 2018. He is following with cardiology. His EF was 20-25%. He was started on medical treatment and his most recent EF in 2023 was 50-55%. He had another echocardiogram today which is pending. He has stopped all of his medications since December. He is wanting to take a more natural route.  He has been walking for the last 3 to 4 weeks.  He started with 5 minutes a day and has been adding a minute per week.  He is currently up to 7 minutes/day.  He has also started drinking beet juice.  He has been taking his aspirin 81 mg daily.  He has been checking his blood pressure at home and it has been 120s/70s.  He is hoping to start adding weight resistance training soon.  He has a history of gout in multiple joints.  It flares up depending on what he eats.  He will take colchicine as needed.   He has a history of obstructive sleep apnea.  He wears wears a CPAP as often as possible.   He has been experiencing right hip pain for the last week.  He has still been trying to walk.  Walking and going up stairs makes the pain worse.  He has not tried anything to help the pain.  He denies swelling and injury.  Depression and anxiety screen done:     01/28/2023    3:11 PM 05/26/2019    11:09 AM  Depression screen PHQ 2/9  Decreased Interest 0 0  Down, Depressed, Hopeless 0 0  PHQ - 2 Score 0 0  Altered sleeping 0 0  Tired, decreased energy 0 0  Change in appetite 0 0  Feeling bad or failure about yourself  0 0  Trouble concentrating 0 0  Moving slowly or fidgety/restless 0 0  Suicidal thoughts 0 0  PHQ-9 Score 0 0  Difficult doing work/chores Not difficult at all       01/28/2023    3:11 PM 05/26/2019   11:09 AM  GAD 7 : Generalized Anxiety Score  Nervous, Anxious, on Edge 0 0  Control/stop worrying 0 0  Worry too much - different things 0 0  Trouble relaxing 0 0  Restless 0 0  Easily annoyed or irritable 0 0  Afraid - awful might happen 0 0  Total GAD 7 Score 0 0  Anxiety Difficulty Not difficult at all     Past Medical History:  Diagnosis Date   Arthritis    "knees" (06/28/2017)   Chronic combined systolic and diastolic heart failure 04/19/2017   a. LHC 04/22/17 - Normal coronary arteries; LVEDP 33 //  Echo 04/21/17 - Mild LVH, EF 20-25, diff HK, restrictive physiology, mod to severe LAE, mild RVE, mild RAE, PASP 35.// Echo 11/18: Moderate concentric LVH, EF 35-40, diffuse HK, grade 1 diastolic dysfunction, mild LAE, mild RVE    History of gout    Migraine    "I had 1 in the 1990s" (06/28/2017)   NICM (nonischemic cardiomyopathy) 04/23/2017   Obesity    Prediabetes    Sleep apnea 04/23/2017    Past Surgical History:  Procedure Laterality Date   CARDIAC CATHETERIZATION  04/2017   patent coronary arteries/medical hx   EYE SURGERY Left    KNEE ARTHROSCOPY Left ~ 2008   LEFT HEART CATH AND CORONARY ANGIOGRAPHY [name] 04/22/2017   Procedure: Left Heart Cath and Coronary Angiography;  Surgeon: Lyn Records, MD;  Location: [facility] INVASIVE CV LAB;  Service: Cardiovascular;  Laterality: [name];    Family History  Problem Relation Age of Onset   Heart disease Mother    Heart attack Maternal Uncle    Heart disease Maternal Grandmother      Social History    Tobacco Use   Smoking status: Never   Smokeless tobacco: Never  Vaping Use   Vaping Use: Never used  Substance Use Topics   Alcohol use: No   Drug use: No    Current Outpatient Medications on File Prior to Visit  Medication Sig Dispense Refill   aspirin 81 MG chewable tablet Chew 1 tablet (81 mg total) by mouth daily.     OVER THE COUNTER MEDICATION Cranberry     PRESCRIPTION MEDICATION Sea Moss     UNABLE TO FIND Med Name: Organic Beet Juice     colchicine 0.6 MG tablet Take two tablets as one dose followed one hour later by one tablet. (Patient not taking: Reported on 01/28/2023) 3 tablet 1   No current facility-administered medications on file prior to visit.     Review of Systems  Constitutional:  Positive for fatigue. Negative for fever.  HENT: Negative.    Eyes: Negative.   Respiratory: Negative.    Cardiovascular: Negative.   Gastrointestinal: Negative.   Genitourinary:  Positive for frequency. Negative for dysuria.  Musculoskeletal:  Positive for arthralgias (right hip).  Skin: Negative.   Neurological: Negative.   Psychiatric/Behavioral: Negative.        Objective:    BP 118/74 (BP Location: Right Arm, Cuff Size: Large)   Pulse 75   Temp 97.6 F (36.4 C)   Ht 5' 10.5" (1.791 m)   Wt 260 lb 3.2 oz (118 kg)   SpO2 96%   BMI 36.81 kg/m   Wt Readings from Last 3 Encounters:  01/28/23 260 lb 3.2 oz (118 kg)  05/07/22 288 lb 3.2 oz (130.7 kg)  06/16/21 296 lb (134.3 kg)    BP Readings from Last 3 Encounters:  01/28/23 118/74  10/16/22 (!) 142/87  10/07/22 136/84    Physical Exam Vitals and nursing note reviewed.  Constitutional:      General: He is not in acute distress.    Appearance: Normal appearance.  HENT:     Head: Normocephalic and atraumatic.     Right Ear: Tympanic membrane, ear canal and external ear normal.     Left Ear: Tympanic membrane, ear canal and external ear normal.  Eyes:     Conjunctiva/sclera: Conjunctivae normal.   Cardiovascular:     Rate and Rhythm: Normal rate and regular rhythm.     Pulses: Normal pulses.  Heart sounds: Normal heart sounds.  Pulmonary:     Effort: Pulmonary effort is normal.     Breath sounds: Normal breath sounds.  Abdominal:     Palpations: Abdomen is soft.     Tenderness: There is no abdominal tenderness.  Musculoskeletal:        General: Normal range of motion.     Cervical back: Normal range of motion and neck supple. No tenderness.     Right lower leg: No edema.     Left lower leg: No edema.  Lymphadenopathy:     Cervical: No cervical adenopathy.  Skin:    General: Skin is warm and dry.  Neurological:     General: No focal deficit present.     Mental Status: He is alert and oriented to person, place, and time.     Cranial Nerves: No cranial nerve deficit.     Coordination: Coordination normal.     Gait: Gait normal.  Psychiatric:        Mood and Affect: Mood normal.        Behavior: Behavior normal.        Thought Content: Thought content normal.        Judgment: Judgment normal.        Assessment & Plan:   Problem List Items Addressed This Visit       Cardiovascular and Mediastinum   Chronic combined systolic and diastolic heart failure - Primary (Chronic)    His echocardiogram in 2018 was 20 to 25%, recently in 2023 it was 50 to 55%.  He had an echocardiogram today that is pending.  He recently stopped all his medications 4 months ago.  He is taking aspirin 81 mg daily.  He has started exercising, walking daily.  He has an appointment with cardiology in July.  Encouraged him to keep this appointment.  He is not fasting today, will have him check fasting blood work with cardiology or when he follows up with me for his physical.  His blood pressure is well-controlled without medication, however it did help his heart function get up to normal range.  We will see what his echocardiogram shows today and see what cardiology says as well.      Hypertension  (Chronic)    Chronic, stable.  BP today 118/74.  Verified his home cuff against our cuff in the office today.  He has been getting 120s/70s at home.  He has stopped all his medications for the last 4 months.  He is looking to take a more natural route to control his chronic diseases.        Respiratory   Sleep apnea    Continue wearing CPAP at night.        Musculoskeletal and Integument   Chronic gout of multiple sites    Chronic, stable.  He uses colchicine with flareups.  Currently stable.        Other   Morbid obesity    BMI 36.8 associated with heart failure, obstructive sleep apnea, and hypertension. Recommend weight loss.         Follow up plan: Return in about 6 months (around 07/30/2023) for CPE.

## 2023-01-28 NOTE — Assessment & Plan Note (Signed)
Chronic, stable.  BP today 118/74.  Verified his home cuff against our cuff in the office today.  He has been getting 120s/70s at home.  He has stopped all his medications for the last 4 months.  He is looking to take a more natural route to control his chronic diseases.

## 2023-01-28 NOTE — Assessment & Plan Note (Signed)
BMI 36.8 associated with heart failure, obstructive sleep apnea, and hypertension. Recommend weight loss.

## 2023-01-28 NOTE — Assessment & Plan Note (Signed)
His echocardiogram in 2018 was 20 to 25%, recently in 2023 it was 50 to 55%.  He had an echocardiogram today that is pending.  He recently stopped all his medications 4 months ago.  He is taking aspirin 81 mg daily.  He has started exercising, walking daily.  He has an appointment with cardiology in July.  Encouraged him to keep this appointment.  He is not fasting today, will have him check fasting blood work with cardiology or when he follows up with me for his physical.  His blood pressure is well-controlled without medication, however it did help his heart function get up to normal range.  We will see what his echocardiogram shows today and see what cardiology says as well.

## 2023-01-30 ENCOUNTER — Telehealth: Payer: Self-pay

## 2023-01-30 DIAGNOSIS — I5042 Chronic combined systolic (congestive) and diastolic (congestive) heart failure: Secondary | ICD-10-CM

## 2023-01-30 NOTE — Telephone Encounter (Signed)
-----   Message from Quintella Reichert, MD sent at 01/30/2023  8:53 AM EDT ----- 2D echo showed severe LV dysfunction with EF 25 to 30% with global hypokinesis and severe enlargement of left ventricle.  The right ventricle also has moderate reduction in function.  Compared to prior echo heart function has significantly declined.  He had normal coronary arteries in 2018 but likely needs a repeat heart cath.  Please refer him to advanced heart failure and get him in ASAP.  Looks like he was having problems affording GDMT so may be the etiology of his decline in EF

## 2023-01-30 NOTE — Telephone Encounter (Signed)
Called to review Echo results with patient. Reviewed that:  2D echo showed severe LV dysfunction with EF 25 to 30% with global hypokinesis and severe enlargement of left ventricle.  The right ventricle also has moderate reduction in function.    Compared to prior echo heart function has significantly declined.  He had normal coronary arteries in 2018 but likely needs a repeat heart cath.  Please refer him to advanced heart failure and get him in ASAP.     Patient verbalizes understanding and agrees to Advanced [facility] clinic referral. Referral placed.

## 2023-01-31 ENCOUNTER — Ambulatory Visit: Admit: 2023-01-31 | Discharge: 2023-02-01 | Payer: MEDICARE

## 2023-01-31 ENCOUNTER — Telehealth: Payer: Self-pay | Admitting: Cardiology

## 2023-01-31 ENCOUNTER — Telehealth: Payer: Self-pay | Admitting: Nurse Practitioner

## 2023-01-31 NOTE — Telephone Encounter (Signed)
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  Asked pt who has completed previous physical.  That is who pt should use.  All questions answered. Pt will call Advanced HF for appointment.

## 2023-01-31 NOTE — Telephone Encounter (Signed)
  Pt is calling back, he would like to request to do the echo again. He said, he is not please of his result last Monday and want to do it again.

## 2023-01-31 NOTE — Telephone Encounter (Signed)
Pt called in requesting CPAP final results or analysis  and Echo results to be sent to home address on file.  Advised pt RN not in the office today but will send to her to address upon return to office.

## 2023-01-31 NOTE — Telephone Encounter (Signed)
Patient is calling to speak with RN again. Transferred to [facility], Charity fundraiser.

## 2023-01-31 NOTE — Telephone Encounter (Signed)
Patient is calling requesting a callback regarding the conversation he had with our office yesterday. Please advise.

## 2023-01-31 NOTE — Telephone Encounter (Signed)
Pt called and want to know when he take his heart meds. Can he still take his apple cidar vinager with his medication or you don't thinks he should mix with his meds

## 2023-01-31 NOTE — Telephone Encounter (Signed)
Called pt in regards to repeating Echo.  Pt reports went to PCP and BP was normal.  Talked while Echo was performed. Feels this may have lowered testing.  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  If he doesn't pass DOT physical he can't work and therefore can't afford medical care.  I explained to pt what Echocardiogram shows.  Also explained taking test again at this point would be a waste of money.  Pt became upset and began to [facility] me.  I explained to pt he has to take accountability for his part in getting himself in this situation.  Stopping medications without medical advice was something that he chose to do.  So to be upset with me is not going to help the situation.  Advised pt to f/u with [facility] as recommended by Dr. Mayford Knife start appropriate therapy and go from there. After about 15 min pt finally agreeable and will call [facility] clinic to set up appointment.  However, still would like to ask to have a repeat Echo. Will send to Dr. Mayford Knife to advise.

## 2023-02-01 ENCOUNTER — Institutional Professional Consult (permissible substitution): Admit: 2023-02-01 | Discharge: 2023-02-02 | Payer: MEDICARE

## 2023-02-01 ENCOUNTER — Encounter: Payer: Self-pay | Admitting: Nurse Practitioner

## 2023-02-01 DIAGNOSIS — C9210 Chronic myeloid leukemia, BCR/ABL-positive, not having achieved remission: Principal | ICD-10-CM

## 2023-02-01 MED ORDER — ATORVASTATIN CALCIUM 40 MG PO TABS: 40.0000 mg | ORAL_TABLET | Freq: Every day | ORAL | 1 refills | Status: DC

## 2023-02-01 MED ORDER — METOPROLOL SUCCINATE ER 25 MG PO TB24: 25.0000 mg | ORAL_TABLET | Freq: Every day | ORAL | 0 refills | Status: DC

## 2023-02-01 MED ORDER — LISINOPRIL 5 MG PO TABS: 5.0000 mg | ORAL_TABLET | Freq: Every day | ORAL | 0 refills | Status: DC

## 2023-02-01 NOTE — Telephone Encounter (Signed)
I spoke with patient and notified him of below message. He is requesting refills of below medications to be sent to [facility]:  Potassium Chloride XR 2 pills Twice Daily Lisinopril 10mg  one pill Daily Metoprolol Succ 50mg  once Daily  Furosemide 40mg  2 pills Twice Daily Atorvastatin 40mg  Daily   This to [facility] Spironolactone 25mg  1/2 tablet daily

## 2023-02-01 NOTE — Telephone Encounter (Signed)
Pt returned your call and would like a call back .. °

## 2023-02-01 NOTE — Telephone Encounter (Signed)
Patient said that his Echo was not good and was referred to Advance Heart Failure clinic at [facility] and gave their number [phone]).

## 2023-02-04 NOTE — Telephone Encounter (Signed)
I called and spoke with patient and notified him of below message. He said that he was unable to get into his Mychart so I read him the message. I scheduled patient for a I follow up visit on 02/26/23. Patient said that he has not taken Entresto before and questions the side effects from this medication and what does it help with.

## 2023-02-04 NOTE — Telephone Encounter (Signed)
Appt sch 5/3 with Dr Shirlee Latch

## 2023-02-04 NOTE — Telephone Encounter (Signed)
LVM to return call.

## 2023-02-06 DIAGNOSIS — R09A2 Globus sensation: Principal | ICD-10-CM

## 2023-02-06 DIAGNOSIS — R070 Pain in throat: Principal | ICD-10-CM

## 2023-02-06 DIAGNOSIS — M542 Cervicalgia: Principal | ICD-10-CM

## 2023-02-06 NOTE — Telephone Encounter (Signed)
Called patient to advise that Echo and Sleep Study results can be mailed to him or picked up, no answer. Left message with no identifiers asking recipient to call office as patient's main number is not listed as an approved number for detailed voice mails.

## 2023-02-06 NOTE — Telephone Encounter (Signed)
Patient calling in to request records be available for pick up tomorrow 02/06/23.

## 2023-02-06 NOTE — Telephone Encounter (Signed)
Patient replied through his Mychart

## 2023-02-07 ENCOUNTER — Telehealth: Payer: Self-pay | Admitting: Cardiology

## 2023-02-07 ENCOUNTER — Telehealth: Payer: Self-pay

## 2023-02-07 ENCOUNTER — Telehealth: Payer: Self-pay | Admitting: Nurse Practitioner

## 2023-02-07 NOTE — Telephone Encounter (Signed)
[name] [phone]    Pt is requesting the past 12 months of his cpap readings, not the sleep study. 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 He stated that he was not successful with this request before and would like it if you could help. He is requesting can you place it up front and let him know when it is ready.

## 2023-02-07 NOTE — Telephone Encounter (Signed)
Pt called and stated he was provided with the wrong paperwork. He stated he didn't ask for Sleep Study results. He wanted his overall average of how many hours he was on cpap machine for the past 12 months, not the actual sleep study results. 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 Pt is requesting a call back as soon as possible. Please advise.

## 2023-02-07 NOTE — Telephone Encounter (Signed)
Patient requesting Echo and Sleep Study results be left at front desk for him. Checked HIM release from 01/28/23, echo and sleep study results printed and left at front desk in sealed envelope.

## 2023-02-07 NOTE — Telephone Encounter (Signed)
I called and spoke with patient and notified him that we do not have his readings and that he would need to call the company where he got his machine at for the readings. He located a phone number on his machine and will call that number for assistance with this

## 2023-02-08 ENCOUNTER — Encounter (HOSPITAL_COMMUNITY): Payer: Self-pay | Admitting: Cardiology

## 2023-02-08 ENCOUNTER — Other Ambulatory Visit (HOSPITAL_COMMUNITY): Payer: Self-pay

## 2023-02-08 ENCOUNTER — Ambulatory Visit (HOSPITAL_COMMUNITY)
Admission: RE | Admit: 2023-02-08 | Discharge: 2023-02-08 | Disposition: A | Discharge status: Home / Self Care | Payer: Managed Care, Other (non HMO) | Source: Ambulatory Visit | Attending: Cardiology | Admitting: Cardiology

## 2023-02-08 VITALS — BP 120/70 | HR 53 | Wt 263.6 lb

## 2023-02-08 DIAGNOSIS — G4733 Obstructive sleep apnea (adult) (pediatric): Secondary | ICD-10-CM | POA: Insufficient documentation

## 2023-02-08 DIAGNOSIS — I5042 Chronic combined systolic (congestive) and diastolic (congestive) heart failure: Secondary | ICD-10-CM

## 2023-02-08 DIAGNOSIS — I110 Hypertensive heart disease with heart failure: Secondary | ICD-10-CM | POA: Insufficient documentation

## 2023-02-08 DIAGNOSIS — Z79899 Other long term (current) drug therapy: Secondary | ICD-10-CM | POA: Diagnosis not present

## 2023-02-08 DIAGNOSIS — R0789 Other chest pain: Secondary | ICD-10-CM | POA: Insufficient documentation

## 2023-02-08 DIAGNOSIS — I5022 Chronic systolic (congestive) heart failure: Secondary | ICD-10-CM | POA: Diagnosis not present

## 2023-02-08 LAB — LIPID PANEL
Cholesterol: 118 mg/dL (ref 0–200)
HDL: 38 mg/dL — ABNORMAL LOW (ref >40)
LDL Cholesterol: 71 mg/dL (ref 0–99)
Total CHOL/HDL Ratio: 3.1 RATIO
Triglycerides: 46 mg/dL (ref <150)
VLDL: 9 mg/dL (ref 0–40)

## 2023-02-08 LAB — BASIC METABOLIC PANEL
Anion gap: 7 (ref 5–15)
BUN: 12 mg/dL (ref 8–23)
CO2: 22 mmol/L (ref 22–32)
Calcium: 8.9 mg/dL (ref 8.9–10.3)
Chloride: 107 mmol/L (ref 98–111)
Creatinine, Ser: 1.03 mg/dL (ref 0.61–1.24)
GFR, Estimated: >60 mL/min (ref >60)
Glucose, Bld: 110 mg/dL — ABNORMAL HIGH (ref 70–99)
Potassium: 3.6 mmol/L (ref 3.5–5.1)
Sodium: 136 mmol/L (ref 135–145)

## 2023-02-08 LAB — HEMOGLOBIN A1C
Hgb A1c MFr Bld: 5.4 % (ref 4.8–5.6)
Mean Plasma Glucose: 108.28 mg/dL

## 2023-02-08 LAB — CBC
HCT: 43.1 % (ref 39.0–52.0)
Hemoglobin: 14.1 g/dL (ref 13.0–17.0)
MCH: 29.8 pg (ref 26.0–34.0)
MCHC: 32.7 g/dL (ref 30.0–36.0)
MCV: 91.1 fL (ref 80.0–100.0)
Platelets: 220 10*3/uL (ref 150–400)
RBC: 4.73 MIL/uL (ref 4.22–5.81)
RDW: 13.2 % (ref 11.5–15.5)
WBC: 5.1 10*3/uL (ref 4.0–10.5)
nRBC: 0 % (ref 0.0–0.2)

## 2023-02-08 LAB — MAGNESIUM: Magnesium: 2.1 mg/dL (ref 1.7–2.4)

## 2023-02-08 LAB — TSH: TSH: 1.838 u[IU]/mL (ref 0.350–4.500)

## 2023-02-08 MED ORDER — SPIRONOLACTONE 25 MG PO TABS: 12.5000 mg | ORAL_TABLET | Freq: Every day | ORAL | 3 refills | Status: DC

## 2023-02-08 MED ORDER — ENTRESTO 24-26 MG PO TABS: 1.0000 | ORAL_TABLET | Freq: Two times a day (BID) | ORAL | 11 refills | Status: DC

## 2023-02-08 NOTE — Patient Instructions (Signed)
STOP Lisinopril, 36 hours after stopping lisinopril,then start Entresto 24/26 mg Twice daily  START Spironolactone 12.5 mg (1/2 Tab) daily.  Labs done today, your results will be available in MyChart, we will contact you for abnormal readings.  You are scheduled for a Cardiac Catheterization on Tuesday, May 14 with Dr. Marca Ancona.  1. Please arrive at the [facility] (Main Entrance A) at [facility]: [address] [place], [place] [address] at 8:30 AM (This time is 2 hour(s) before your procedure to ensure your preparation). Free valet parking service is available. You will check in at ADMITTING. The support person will be asked to wait in the waiting room.  It is OK to have someone drop you off and come back when you are ready to be discharged.    Special note: Every effort is made to have your procedure done on time. Please understand that emergencies sometimes delay scheduled procedures.  2. Diet: Do not eat solid foods after midnight.  The patient may have clear liquids until 5am upon the day of the procedure.  3.  Medication instructions in preparation for your procedure:   Contrast Allergy: No  HOLD Spironolactone the morning of procedure   On the morning of your procedure, take your Aspirin 81 mg and any morning medicines NOT listed above.  You may use sips of water.  5. Plan to go home the same day, you will only stay overnight if medically necessary. 6. Bring a current list of your medications and current insurance cards. 7. You MUST have a responsible person to drive you home. 8. Someone MUST be with you the first 24 hours after you arrive home or your discharge will be delayed. 9. Please wear clothes that are easy to get on and off and wear slip-on shoes.  Your physician recommends that you schedule a follow-up appointment in: 2 weeks after you cath

## 2023-02-08 NOTE — Telephone Encounter (Signed)
Called patient to let him know that CPAP downloads most likely would take a while for [place] to get to him, patient states he already contacted his DME company and was able to get them. Patient states he does not need any other documentation at this time.

## 2023-02-09 NOTE — Progress Notes (Signed)
PCP: McElwee, Lauren A, NP Cardiology: Dr. Turner HF Cardiology: Dr. Vahe Pienta  62 y.o. with history of OSA, HTN, gout, and cardiomyopathy was referred by Dr. Turner for evaluation of CHF.  Patient was diagnosed with CHF in 2018, echo at that time showed EF 20-25%.  Cath showed no significant CAD in 7/18. He was started on GDMT and EF gradually increased, echo in 5/22 with EF 45-50%, and echo in 4/23 with EF 50%.  After the 4/23 echo, patient felt good so he stopped all his medications.  Echo was repeated in 4/24, showing EF back down to 25-30% with regional WMAs and moderate RV dysfunction.  He restarted on lisinopril and Toprol XL about a week ago.   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 Symptomatically, he is doing ok.  He has occasional atypical chest pain (sharp, central, not related to exertion).  He walks some for exercise, no dyspnea walking on flat ground. No orthopnea/PND.  No lightheadedness. He does not smoke and rarely drinks. He lives alone (widowed).   ECG (personally reviewed): NSR, inferolateral TWIs  Labs (7/23): LDL 74, K 3.8, creatinine 1.16, hgb 12.7  PMH: 1. OSA: CPAP 2. HTN 3. Hyperlipidemia 4. Gout 5. Chronic systolic CHF: Diagnosed in 2018, nonischemic cardiomyopathy.  - Echo (2018): EF 20-25%, mild LVH - LHC (7/18): Normal coronaries - Echo (5/19): EF 40-45% - Echo (4/21): EF 45-50% - Echo (5/22): EF 45-50% - PYP scan (9/22): grade 1, H/CL 1-1.5. Equivocal. - Echo (4/23): EF 50% - Echo (4/24): EF 25-30%, regional wall motion abnormalities, moderate RV dysfunction  SH: Truck driver, nonsmoker, rare ETOH, widowed.    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   Current Outpatient Medications  Medication Sig Dispense Refill   APPLE CIDER VINEGAR PO Take 1 tablet by mouth daily.     aspirin 81 MG chewable tablet Chew 1  tablet (81 mg total) by mouth daily.     atorvastatin (LIPITOR) 40 MG tablet Take 1 tablet (40 mg total) by mouth daily. 90 tablet 1   metoprolol succinate (TOPROL-XL) 25 MG 24 hr tablet Take 1 tablet (25 mg total) by mouth daily. 90 tablet 0   OVER THE COUNTER MEDICATION Cranberry     PRESCRIPTION MEDICATION Sea Moss     sacubitril-valsartan (ENTRESTO) 24-26 MG Take 1 tablet by mouth 2 (two) times daily. 60 tablet 11   UNABLE TO FIND Med Name: Organic Beet Juice     spironolactone (ALDACTONE) 25 MG tablet Take 0.5 tablets (12.5 mg total) by mouth daily. 45 tablet 3   No current facility-administered medications for this encounter.   BP 120/70   Pulse (!) 53   Wt 119.6 kg (263 lb 9.6 oz)   SpO2 97%   BMI 37.29 kg/m  General: NAD Neck: No JVD, no thyromegaly or thyroid nodule.  Lungs: Clear to auscultation bilaterally with normal respiratory effort. CV: Nondisplaced PMI.  Heart regular S1/S2, no S3/S4, no murmur.  No peripheral edema.  No carotid bruit.  Normal pedal pulses.  Abdomen: Soft, nontender, no hepatosplenomegaly, no distention.  Skin: Intact without lesions or rashes.  Neurologic: Alert and oriented x 3.  Psych: Normal affect. Extremities: No clubbing or cyanosis.  HEENT: Normal.   Assessment/Plan: 1. Chronic systolic CHF: Cardiomyopathy was diagnosed in 2018 with echo showing EF 20-25%,    cath at that time showed no CAD so nonischemic cardiomyopathy.  Over the years, EF improved until it was up to 50% on echo in 4/23.  Patient stopped his medications, and echo in 4/24 showed EF 25-30%, regional wall motion abnormalities, moderate RV dysfunction.  Patient may have had a cardiomyopathy that improved with GDMT then worsened when he stopped all his meds.  However, with wall motion abnormalities on echo and atypical chest pain, think we will need to rule out CAD.  NYHA class I-II.  - I will arrange for LHC/RHC to assess filling pressures, cardiac output, and coronaries. We  discussed risks/benefits and he agrees to the procedure.  - If cath is unrevealing, he will need cardiac MRI.  - Continue Toprol XL 25 mg daily.  - Stop lisinopril x 36 hrs and start Entresto 24/26 bid after that time.  BMET/BNP today and again in 10 days.  - spironolactone 12.5 daily.  - Next step will be Farxiga.  - Will need repeat echo after cath and medication titration.   2. OSA: Continue CPAP.  3. HTN: Medication titration as above.   Followup 2 wks post-cath.   Maddilynn Esperanza 02/09/2023  

## 2023-02-09 NOTE — H&P (View-Only) (Signed)
PCP: Gerre Scull, NP Cardiology: Dr. Mayford Knife HF Cardiology: Dr. Shirlee Latch  62 y.o. with history of OSA, HTN, gout, and cardiomyopathy was referred by Dr. Mayford Knife for evaluation of CHF.  Patient was diagnosed with CHF in 2018, echo at that time showed EF 20-25%.  Cath showed no significant CAD in 7/18. He was started on GDMT and EF gradually increased, echo in 5/22 with EF 45-50%, and echo in 4/23 with EF 50%.  After the 4/23 echo, patient felt good so he stopped all his medications.  Echo was repeated in 4/24, showing EF back down to 25-30% with regional WMAs and moderate RV dysfunction.  He restarted on lisinopril and Toprol XL about a week ago.   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 Symptomatically, he is doing ok.  He has occasional atypical chest pain (sharp, central, not related to exertion).  He walks some for exercise, no dyspnea walking on flat ground. No orthopnea/PND.  No lightheadedness. He does not smoke and rarely drinks. He lives alone (widowed).   ECG (personally reviewed): NSR, inferolateral TWIs  Labs (7/23): LDL 74, K 3.8, creatinine 1.61, hgb 12.7  PMH: 1. OSA: CPAP 2. HTN 3. Hyperlipidemia 4. Gout 5. Chronic systolic CHF: Diagnosed in 2018, nonischemic cardiomyopathy.  - Echo (2018): EF 20-25%, mild LVH - LHC (7/18): Normal coronaries - Echo (5/19): EF 40-45% - Echo (4/21): EF 45-50% - Echo (5/22): EF 45-50% - PYP scan (9/22): grade 1, H/CL 1-1.5. Equivocal. - Echo (4/23): EF 50% - Echo (4/24): EF 25-30%, regional wall motion abnormalities, moderate RV dysfunction  SH: Truck driver, nonsmoker, rare ETOH, widowed.    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   Current Outpatient Medications  Medication Sig Dispense Refill   APPLE CIDER VINEGAR PO Take 1 tablet by mouth daily.     aspirin 81 MG chewable tablet Chew 1  tablet (81 mg total) by mouth daily.     atorvastatin (LIPITOR) 40 MG tablet Take 1 tablet (40 mg total) by mouth daily. 90 tablet 1   metoprolol succinate (TOPROL-XL) 25 MG 24 hr tablet Take 1 tablet (25 mg total) by mouth daily. 90 tablet 0   OVER THE COUNTER MEDICATION Cranberry     PRESCRIPTION MEDICATION [place]     sacubitril-valsartan (ENTRESTO) 24-26 MG Take 1 tablet by mouth 2 (two) times daily. 60 tablet 11   UNABLE TO FIND Med Name: Organic Beet Juice     spironolactone (ALDACTONE) 25 MG tablet Take 0.5 tablets (12.5 mg total) by mouth daily. 45 tablet 3   No current facility-administered medications for this encounter.   BP 120/70   Pulse (!) 53   Wt 119.6 kg (263 lb 9.6 oz)   SpO2 97%   BMI 37.29 kg/m  General: NAD Neck: No JVD, no thyromegaly or thyroid nodule.  Lungs: Clear to auscultation bilaterally with normal respiratory effort. CV: Nondisplaced PMI.  Heart regular S1/S2, no S3/S4, no murmur.  No peripheral edema.  No carotid bruit.  Normal pedal pulses.  Abdomen: Soft, nontender, no hepatosplenomegaly, no distention.  Skin: Intact without lesions or rashes.  Neurologic: Alert and oriented x 3.  Psych: Normal affect. Extremities: No clubbing or cyanosis.  HEENT: Normal.   Assessment/Plan: 1. Chronic systolic CHF: Cardiomyopathy was diagnosed in 2018 with echo showing EF 20-25%,  cath at that time showed no CAD so nonischemic cardiomyopathy.  Over the years, EF improved until it was up to 50% on echo in 4/23.  Patient stopped his medications, and echo in 4/24 showed EF 25-30%, regional wall motion abnormalities, moderate RV dysfunction.  Patient may have had a cardiomyopathy that improved with GDMT then worsened when he stopped all his meds.  However, with wall motion abnormalities on echo and atypical chest pain, think we will need to rule out CAD.  NYHA class I-II.  - I will arrange for LHC/RHC to assess filling pressures, cardiac output, and coronaries. We  discussed risks/benefits and he agrees to the procedure.  - If cath is unrevealing, he will need cardiac MRI.  - Continue Toprol XL 25 mg daily.  - Stop lisinopril x 36 hrs and start Entresto 24/26 bid after that time.  BMET/BNP today and again in 10 days.  - spironolactone 12.5 daily.  - Next step will be [place].  - Will need repeat echo after cath and medication titration.   2. OSA: Continue CPAP.  3. HTN: Medication titration as above.   Followup 2 wks post-cath.   Marca Ancona 02/09/2023

## 2023-02-19 ENCOUNTER — Ambulatory Visit (HOSPITAL_COMMUNITY)
Admission: RE | Admit: 2023-02-19 | Discharge: 2023-02-19 | Disposition: A | Discharge status: Home / Self Care | Payer: Managed Care, Other (non HMO) | Attending: Cardiology | Admitting: Cardiology

## 2023-02-19 ENCOUNTER — Other Ambulatory Visit: Payer: Self-pay

## 2023-02-19 ENCOUNTER — Encounter (HOSPITAL_COMMUNITY)
Admission: RE | Disposition: A | Discharge status: Home / Self Care | Payer: Self-pay | Source: Home / Self Care | Attending: Cardiology

## 2023-02-19 DIAGNOSIS — I429 Cardiomyopathy, unspecified: Secondary | ICD-10-CM | POA: Diagnosis not present

## 2023-02-19 DIAGNOSIS — I5022 Chronic systolic (congestive) heart failure: Secondary | ICD-10-CM | POA: Diagnosis not present

## 2023-02-19 DIAGNOSIS — Z79899 Other long term (current) drug therapy: Secondary | ICD-10-CM | POA: Insufficient documentation

## 2023-02-19 DIAGNOSIS — I5042 Chronic combined systolic (congestive) and diastolic (congestive) heart failure: Secondary | ICD-10-CM

## 2023-02-19 DIAGNOSIS — G4733 Obstructive sleep apnea (adult) (pediatric): Secondary | ICD-10-CM | POA: Insufficient documentation

## 2023-02-19 DIAGNOSIS — I428 Other cardiomyopathies: Secondary | ICD-10-CM | POA: Insufficient documentation

## 2023-02-19 DIAGNOSIS — I110 Hypertensive heart disease with heart failure: Secondary | ICD-10-CM | POA: Insufficient documentation

## 2023-02-19 DIAGNOSIS — Z8249 Family history of ischemic heart disease and other diseases of the circulatory system: Secondary | ICD-10-CM | POA: Insufficient documentation

## 2023-02-19 HISTORY — PX: RIGHT/LEFT HEART CATH AND CORONARY ANGIOGRAPHY: CATH118266

## 2023-02-19 LAB — BASIC METABOLIC PANEL
Anion gap: 10 (ref 5–15)
BUN: 9 mg/dL (ref 8–23)
CO2: 25 mmol/L (ref 22–32)
Calcium: 9.4 mg/dL (ref 8.9–10.3)
Chloride: 105 mmol/L (ref 98–111)
Creatinine, Ser: 0.94 mg/dL (ref 0.61–1.24)
GFR, Estimated: >60 mL/min (ref >60)
Glucose, Bld: 100 mg/dL — ABNORMAL HIGH (ref 70–99)
Potassium: 3.6 mmol/L (ref 3.5–5.1)
Sodium: 140 mmol/L (ref 135–145)

## 2023-02-19 LAB — POCT I-STAT EG7
Acid-Base Excess: 1 mmol/L (ref 0.0–2.0)
Acid-base deficit: 1 mmol/L (ref 0.0–2.0)
Bicarbonate: 24.6 mmol/L (ref 20.0–28.0)
Bicarbonate: 26.1 mmol/L (ref 20.0–28.0)
Calcium, Ion: 1.2 mmol/L (ref 1.15–1.40)
Calcium, Ion: 1.28 mmol/L (ref 1.15–1.40)
HCT: 44 % (ref 39.0–52.0)
HCT: 45 % (ref 39.0–52.0)
Hemoglobin: 15 g/dL (ref 13.0–17.0)
Hemoglobin: 15.3 g/dL (ref 13.0–17.0)
O2 Saturation: 63 %
O2 Saturation: 71 %
Potassium: 3.6 mmol/L (ref 3.5–5.1)
Potassium: 3.7 mmol/L (ref 3.5–5.1)
Sodium: 142 mmol/L (ref 135–145)
Sodium: 144 mmol/L (ref 135–145)
TCO2: 26 mmol/L (ref 22–32)
TCO2: 27 mmol/L (ref 22–32)
pCO2, Ven: 41.3 mmHg — ABNORMAL LOW (ref 44–60)
pCO2, Ven: 43.4 mmHg — ABNORMAL LOW (ref 44–60)
pH, Ven: 7.383 (ref 7.25–7.43)
pH, Ven: 7.388 (ref 7.25–7.43)
pO2, Ven: 33 mmHg (ref 32–45)
pO2, Ven: 38 mmHg (ref 32–45)

## 2023-02-19 SURGERY — RIGHT/LEFT HEART CATH AND CORONARY ANGIOGRAPHY
Anesthesia: LOCAL

## 2023-02-19 MED ORDER — SODIUM CHLORIDE 0.9 % IV SOLN: INTRAVENOUS | Status: DC

## 2023-02-19 MED ORDER — MIDAZOLAM HCL 2 MG/2ML IJ SOLN
INTRAMUSCULAR | Status: DC | PRN
  Administered 2023-02-19: .5 mg via INTRAVENOUS
  Administered 2023-02-19: 1 mg via INTRAVENOUS

## 2023-02-19 MED ORDER — LABETALOL HCL 5 MG/ML IV SOLN: 10.0000 mg | INTRAVENOUS | Status: DC | PRN

## 2023-02-19 MED ORDER — HYDRALAZINE HCL 20 MG/ML IJ SOLN: 10.0000 mg | INTRAMUSCULAR | Status: DC | PRN

## 2023-02-19 MED ORDER — FENTANYL CITRATE (PF) 100 MCG/2ML IJ SOLN
INTRAMUSCULAR | Status: AC
  Filled 2023-02-19: qty 2

## 2023-02-19 MED ORDER — ASPIRIN 81 MG PO CHEW: 81.0000 mg | CHEWABLE_TABLET | Freq: Once | ORAL | Status: DC

## 2023-02-19 MED ORDER — SODIUM CHLORIDE 0.9% FLUSH: 3.0000 mL | Freq: Two times a day (BID) | INTRAVENOUS | Status: DC

## 2023-02-19 MED ORDER — ASPIRIN 81 MG PO CHEW
CHEWABLE_TABLET | ORAL | Status: AC
  Administered 2023-02-19: 81 mg
  Filled 2023-02-19: qty 1

## 2023-02-19 MED ORDER — SODIUM CHLORIDE 0.9% FLUSH: 3.0000 mL | INTRAVENOUS | Status: DC | PRN

## 2023-02-19 MED ORDER — ONDANSETRON HCL 4 MG/2ML IJ SOLN: 4.0000 mg | Freq: Four times a day (QID) | INTRAMUSCULAR | Status: DC | PRN

## 2023-02-19 MED ORDER — HEPARIN (PORCINE) IN NACL 1000-0.9 UT/500ML-% IV SOLN
INTRAVENOUS | Status: DC | PRN
  Administered 2023-02-19 (×2): 500 mL

## 2023-02-19 MED ORDER — LIDOCAINE HCL (PF) 1 % IJ SOLN
INTRAMUSCULAR | Status: DC | PRN
  Administered 2023-02-19 (×2): 2 mL

## 2023-02-19 MED ORDER — IOHEXOL 350 MG/ML SOLN
INTRAVENOUS | Status: DC | PRN
  Administered 2023-02-19: 80 mL via INTRA_ARTERIAL

## 2023-02-19 MED ORDER — HEPARIN SODIUM (PORCINE) 1000 UNIT/ML IJ SOLN
INTRAMUSCULAR | Status: AC
  Filled 2023-02-19: qty 10

## 2023-02-19 MED ORDER — VERAPAMIL HCL 2.5 MG/ML IV SOLN
INTRAVENOUS | Status: DC | PRN
  Administered 2023-02-19: 10 mL via INTRA_ARTERIAL

## 2023-02-19 MED ORDER — VERAPAMIL HCL 2.5 MG/ML IV SOLN
INTRAVENOUS | Status: AC
  Filled 2023-02-19: qty 2

## 2023-02-19 MED ORDER — SODIUM CHLORIDE 0.9 % IV SOLN: 250.0000 mL | INTRAVENOUS | Status: DC | PRN

## 2023-02-19 MED ORDER — LIDOCAINE HCL (PF) 1 % IJ SOLN
INTRAMUSCULAR | Status: AC
  Filled 2023-02-19: qty 30

## 2023-02-19 MED ORDER — FENTANYL CITRATE (PF) 100 MCG/2ML IJ SOLN
INTRAMUSCULAR | Status: DC | PRN
  Administered 2023-02-19 (×2): 25 ug via INTRAVENOUS

## 2023-02-19 MED ORDER — ACETAMINOPHEN 325 MG PO TABS: 650.0000 mg | ORAL_TABLET | ORAL | Status: DC | PRN

## 2023-02-19 MED ORDER — HEPARIN SODIUM (PORCINE) 1000 UNIT/ML IJ SOLN
INTRAMUSCULAR | Status: DC | PRN
  Administered 2023-02-19: 5000 [IU] via INTRAVENOUS

## 2023-02-19 MED ORDER — MIDAZOLAM HCL 2 MG/2ML IJ SOLN
INTRAMUSCULAR | Status: AC
  Filled 2023-02-19: qty 2

## 2023-02-19 SURGICAL SUPPLY — 11 items
CATH 5FR JL3.5 JR4 ANG PIG MP (CATHETERS) IMPLANT
CATH BALLN WEDGE 5F 110CM (CATHETERS) IMPLANT
CATH INFINITI 5 FR 3DRC (CATHETERS) IMPLANT
DEVICE RAD COMP TR BAND LRG (VASCULAR PRODUCTS) IMPLANT
GLIDESHEATH SLEND SS 6F .021 (SHEATH) IMPLANT
GUIDEWIRE INQWIRE 1.5J.035X260 (WIRE) IMPLANT
INQWIRE 1.5J .035X260CM (WIRE) ×1
KIT HEART LEFT (KITS) ×2 IMPLANT
PACK CARDIAC CATHETERIZATION (CUSTOM PROCEDURE TRAY) ×2 IMPLANT
SHEATH GLIDE SLENDER 4/5FR (SHEATH) IMPLANT
TRANSDUCER W/STOPCOCK (MISCELLANEOUS) ×2 IMPLANT

## 2023-02-19 NOTE — Progress Notes (Signed)
TR BAND REMOVAL  LOCATION:    right radial  DEFLATED PER PROTOCOL:    Yes.    TIME BAND OFF / DRESSING APPLIED: 02/19/23 at 1435   SITE UPON ARRIVAL:    Level 0  SITE AFTER BAND REMOVAL:    Level 0  CIRCULATION SENSATION AND MOVEMENT:    Within Normal Limits   Yes.    COMMENTS:

## 2023-02-19 NOTE — Interval H&P Note (Signed)
History and Physical Interval Note:  02/19/2023 11:52 AM  [name]  has presented today for surgery, with the diagnosis of CHF.  The various methods of treatment have been discussed with the patient and family. After consideration of risks, benefits and other options for treatment, the patient has consented to  Procedure(s): RIGHT/LEFT HEART CATH AND CORONARY ANGIOGRAPHY ([name]) as a surgical intervention.  The patient's history has been reviewed, patient examined, no change in status, stable for surgery.  I have reviewed the patient's chart and labs.  Questions were answered to the patient's satisfaction.     Livian Vanderbeck [employer]

## 2023-02-20 ENCOUNTER — Encounter (HOSPITAL_COMMUNITY): Payer: Self-pay | Admitting: Cardiology

## 2023-02-26 ENCOUNTER — Encounter: Payer: Self-pay | Admitting: Nurse Practitioner

## 2023-02-26 ENCOUNTER — Ambulatory Visit: Payer: Managed Care, Other (non HMO) | Admitting: Nurse Practitioner

## 2023-02-26 VITALS — BP 124/78 | HR 55 | Temp 97.0°F | Ht 70.0 in | Wt 276.4 lb

## 2023-02-26 DIAGNOSIS — Z23 Encounter for immunization: Secondary | ICD-10-CM | POA: Diagnosis not present

## 2023-02-26 DIAGNOSIS — Z1211 Encounter for screening for malignant neoplasm of colon: Secondary | ICD-10-CM

## 2023-02-26 DIAGNOSIS — I10 Essential (primary) hypertension: Secondary | ICD-10-CM

## 2023-02-26 DIAGNOSIS — I5042 Chronic combined systolic (congestive) and diastolic (congestive) heart failure: Secondary | ICD-10-CM | POA: Diagnosis not present

## 2023-02-26 NOTE — Progress Notes (Signed)
Established Patient Office Visit  Subjective   Patient ID: [name], male    DOB: [birth year]  Age: 62 y.o. MRN: [id number]  Chief Complaint  Patient presents with   Dizziness    Side effects from medication, tetanus injection    HPI  [name] is here to follow-up on hypertension and heart failure.   He states that he is doing well. He had a heart catheterization with cardiology that didn't show any blockages. He has started on entresto 24-26mg  BID and has been taking Toprol XL 25mg  daily, and spironolactone 25mg  every other day. He states that he was having some dizziness when he first started the medications again. He states this has gotten slightly better. He doesn't feel like he is going to fall, he just feels slightly off balance. He denies chest pain and shortness of breath.     ROS See pertinent positives and negatives per HPI.    Objective:     BP 124/78 (BP Location: Left Arm)   Pulse (!) 55   Temp (!) 97 F (36.1 C)   Ht 5\' 10"  (1.778 m)   Wt 276 lb 6.4 oz (125.4 kg)   SpO2 96%   BMI 39.66 kg/m    Physical Exam Vitals and nursing note reviewed.  Constitutional:      Appearance: Normal appearance.  HENT:     Head: Normocephalic.  Eyes:     Conjunctiva/sclera: Conjunctivae normal.  Cardiovascular:     Rate and Rhythm: Normal rate and regular rhythm.     Pulses: Normal pulses.     Heart sounds: Normal heart sounds.  Pulmonary:     Effort: Pulmonary effort is normal.     Breath sounds: Normal breath sounds.  Musculoskeletal:     Cervical back: Normal range of motion.  Skin:    General: Skin is warm.  Neurological:     General: No focal deficit present.     Mental Status: He is alert and oriented to person, place, and time.  Psychiatric:        Mood and Affect: Mood normal.        Behavior: Behavior normal.        Thought Content: Thought content normal.        Judgment: Judgment normal.      Assessment & Plan:   Problem  List Items Addressed This Visit       Cardiovascular and Mediastinum   Chronic combined systolic and diastolic heart failure (HCC) - Primary (Chronic)    Chronic, stable. Euvolemic on exam today. Recent echo showed EF 25-30% on 01/28/23. Continue entresto 24-46mg  BID, toprol XL 25mg  daily and spironolactone 25mg  every other day. He has been having some dizziness since restarting his medications. His heart rate is in the 50s, when last visit it was in the 70s. He can decrease his Toprol XL to 12.5mg  daily to see if this helps with the dizziness. Keep checking blood pressure at home and collaboration with cardiology. Follow-up in 6 months.       Hypertension (Chronic)    Chronic, stable. Continue entresto 24-46mg  BID, toprol XL 25mg  daily and spironolactone 25mg  every other day. He has been having some dizziness since restarting his medications. His heart rate is in the 50s, when last visit it was in the 70s. He can decrease his Toprol XL to 12.5mg  daily to see if this helps with the dizziness. Keep checking blood pressure at home and collaboration with cardiology. Follow-up in  6 months.       Other Visit Diagnoses     Immunization due       Td booster given today   Relevant Orders   Td vaccine greater than or equal to 7yo preservative free IM (Completed)   Screen for colon cancer       Cologuard ordered today.   Relevant Orders   Cologuard       Return in about 6 months (around 08/29/2023) for CPE.    Gerre Scull, NP

## 2023-02-26 NOTE — Assessment & Plan Note (Signed)
Chronic, stable. Euvolemic on exam today. Recent echo showed EF 25-30% on 01/28/23. Continue entresto 24-46mg  BID, toprol XL 25mg  daily and spironolactone 25mg  every other day. He has been having some dizziness since restarting his medications. His heart rate is in the 50s, when last visit it was in the 70s. He can decrease his Toprol XL to 12.5mg  daily to see if this helps with the dizziness. Keep checking blood pressure at home and collaboration with cardiology. Follow-up in 6 months.

## 2023-02-26 NOTE — Patient Instructions (Signed)
It was great to see you!  If you keep having dizziness, decrease your metoprolol to 1/2 tablet daily or every other day.   Keep taking your entresto and spironolactone.   Let's follow-up in 6 months, sooner if you have concerns.  If a referral was placed today, you will be contacted for an appointment. Please note that routine referrals can sometimes take up to 3-4 weeks to process. Please call our office if you haven't heard anything after this time frame.  Take care,  Rodman Pickle, NP

## 2023-02-26 NOTE — Assessment & Plan Note (Signed)
Chronic, stable. Continue entresto 24-46mg  BID, toprol XL 25mg  daily and spironolactone 25mg  every other day. He has been having some dizziness since restarting his medications. His heart rate is in the 50s, when last visit it was in the 70s. He can decrease his Toprol XL to 12.5mg  daily to see if this helps with the dizziness. Keep checking blood pressure at home and collaboration with cardiology. Follow-up in 6 months.

## 2023-03-01 ENCOUNTER — Institutional Professional Consult (permissible substitution): Admit: 2023-03-01 | Discharge: 2023-03-02 | Payer: MEDICARE

## 2023-03-01 DIAGNOSIS — C9210 Chronic myeloid leukemia, BCR/ABL-positive, not having achieved remission: Principal | ICD-10-CM

## 2023-03-04 NOTE — Progress Notes (Signed)
PCP: Gerre Scull, NP Cardiology: Dr. Mayford Knife HF Cardiology: Dr. Shirlee Latch  62 y.o. with history of OSA, HTN, gout, and cardiomyopathy was referred by Dr. Mayford Knife for evaluation of CHF.  Patient was diagnosed with CHF in 2018, echo at that time showed EF 20-25%.  Cath showed no significant CAD in 7/18. He was started on GDMT and EF gradually increased, echo in 5/22 with EF 45-50%, and echo in 4/23 with EF 50%.  After the 4/23 echo, patient felt good so he stopped all his medications.  Echo was repeated in 4/24, showing EF back down to 25-30% with regional WMAs and moderate RV dysfunction.  He restarted on lisinopril and Toprol XL about a week ago.   LHC/RHC 5/24: Normal filling pressures, preserved cardiac output, no significant CAD  Today he returns for AHF follow up. Overall feeling good. Denies palpitations, CP, dizziness, edema, or PND/Orthopnea. No SOB unless he tries to run. Appetite good, eats out a lot with being on the road but does try to watch what he eats. No fever or chills. Does not weight at home. Taking all medications. Denies ETOH, smoking. 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   ECG (personally reviewed): No EKG today  Labs (7/23): LDL 74, K 3.8, creatinine 8.10, hgb 12.7 Labs (5/24): K 3.6, SCr .94, TSH 1.8, Hgb A1c 5.4, LDL 71  PMH: 1. OSA: CPAP 2. HTN 3. Hyperlipidemia 4. Gout 5. Chronic systolic CHF: Diagnosed in 2018, nonischemic cardiomyopathy.  - Echo (2018): EF 20-25%, mild LVH - LHC (7/18): Normal coronaries - Echo (5/19): EF 40-45% - Echo (4/21): EF 45-50% - Echo (5/22): EF 45-50% - PYP scan (9/22): grade 1, H/CL 1-1.5. Equivocal. - Echo (4/23): EF 50% - Echo (4/24): EF 25-30%, regional wall motion abnormalities, moderate RV dysfunction - LHC/RHC 5/24: Normal filling pressures, preserved cardiac output, no significant CAD  SH: Truck driver, nonsmoker, rare ETOH, widowed.    FH: Uncle with MI, mother with "heart problems."   ROS: All systems  reviewed and negative except as per HPI.   Current Outpatient Medications  Medication Sig Dispense Refill   aspirin 81 MG chewable tablet Chew 1 tablet (81 mg total) by mouth daily.     atorvastatin (LIPITOR) 40 MG tablet Take 1 tablet (40 mg total) by mouth daily. 90 tablet 1   metoprolol succinate (TOPROL-XL) 25 MG 24 hr tablet Take 1 tablet (25 mg total) by mouth daily. 90 tablet 0   sacubitril-valsartan (ENTRESTO) 24-26 MG Take 1 tablet by mouth 2 (two) times daily. 60 tablet 11   spironolactone (ALDACTONE) 25 MG tablet Take 0.5 tablets (12.5 mg total) by mouth daily. (Patient taking differently: Take 25 mg by mouth every other day.) 45 tablet 3   No current facility-administered medications for this encounter.   BP 128/80   Pulse 68   Wt 123.2 kg (271 lb 9.6 oz)   SpO2 93%   BMI 38.97 kg/m  General:  well appearing.  No respiratory difficulty.walked into clinic HEENT: normal Neck: supple. JVD flat. Carotids 2+ bilat; no bruits. No lymphadenopathy or thyromegaly appreciated. Cor: PMI nondisplaced. Regular rate & rhythm. No rubs, gallops or murmurs. Lungs: clear Abdomen: soft, nontender, nondistended. No hepatosplenomegaly. No bruits or masses. Good bowel sounds. Extremities: no cyanosis, clubbing, rash, edema  Neuro: alert & oriented x 3, cranial nerves grossly intact. moves all 4 extremities w/o difficulty. Affect pleasant.   Wt Readings from Last 3 Encounters:  03/05/23 123.2 kg (271 lb 9.6  oz)  02/26/23 125.4 kg (276 lb 6.4 oz)  02/19/23 117.9 kg (260 lb)   Assessment/Plan: 1. Chronic systolic CHF: Cardiomyopathy was diagnosed in 2018 with echo showing EF 20-25%,  cath at that time showed no CAD so nonischemic cardiomyopathy.  Over the years, EF improved until it was up to 50% on echo in 4/23.  Patient stopped his medications, and echo in 4/24 showed EF 25-30%, regional wall motion abnormalities, moderate RV dysfunction.  Patient may have had a cardiomyopathy that improved  with GDMT then worsened when he stopped all his meds. NYHA class I. Volume appears stable. LHC/RHC 5/24: Normal filling pressures, preserved cardiac output, no significant CAD - Order cardiac MRI with unremarkable cath.  - Continue Toprol XL 25 mg daily.  - Continue Entresto 24/26 bid - Continue spiro 25 mg QOD (does not want to increase to daily) - Start Farxiga 10 mg daily. BMET today, repeat 10 days.  - Will need repeat echo after further medication titration.   2. OSA: Continue CPAP.  3. HTN: Medication titration as above.   Follow-up in 3 weeks with PharmD to try to optimize GDMT. Follow-up 2 months with echo with Dr. Thressa Sheller AGACNP-BC  03/05/2023

## 2023-03-05 ENCOUNTER — Other Ambulatory Visit (HOSPITAL_COMMUNITY): Payer: Self-pay

## 2023-03-05 ENCOUNTER — Encounter (HOSPITAL_COMMUNITY): Payer: Self-pay

## 2023-03-05 ENCOUNTER — Ambulatory Visit (HOSPITAL_COMMUNITY)
Admission: RE | Admit: 2023-03-05 | Discharge: 2023-03-05 | Disposition: A | Discharge status: Home / Self Care | Payer: Managed Care, Other (non HMO) | Source: Ambulatory Visit | Attending: Internal Medicine | Admitting: Internal Medicine

## 2023-03-05 VITALS — BP 128/80 | HR 68 | Wt 271.6 lb

## 2023-03-05 DIAGNOSIS — Z79899 Other long term (current) drug therapy: Secondary | ICD-10-CM | POA: Insufficient documentation

## 2023-03-05 DIAGNOSIS — Z7984 Long term (current) use of oral hypoglycemic drugs: Secondary | ICD-10-CM | POA: Diagnosis not present

## 2023-03-05 DIAGNOSIS — I110 Hypertensive heart disease with heart failure: Secondary | ICD-10-CM | POA: Diagnosis not present

## 2023-03-05 DIAGNOSIS — G4733 Obstructive sleep apnea (adult) (pediatric): Secondary | ICD-10-CM | POA: Diagnosis not present

## 2023-03-05 DIAGNOSIS — I5022 Chronic systolic (congestive) heart failure: Secondary | ICD-10-CM | POA: Diagnosis not present

## 2023-03-05 DIAGNOSIS — I10 Essential (primary) hypertension: Secondary | ICD-10-CM

## 2023-03-05 LAB — BASIC METABOLIC PANEL
Anion gap: 8 (ref 5–15)
BUN: 8 mg/dL (ref 8–23)
CO2: 24 mmol/L (ref 22–32)
Calcium: 9.2 mg/dL (ref 8.9–10.3)
Chloride: 103 mmol/L (ref 98–111)
Creatinine, Ser: 1.06 mg/dL (ref 0.61–1.24)
GFR, Estimated: >60 mL/min (ref >60)
Glucose, Bld: 94 mg/dL (ref 70–99)
Potassium: 4 mmol/L (ref 3.5–5.1)
Sodium: 135 mmol/L (ref 135–145)

## 2023-03-05 LAB — CBC
HCT: 45.5 % (ref 39.0–52.0)
Hemoglobin: 15.3 g/dL (ref 13.0–17.0)
MCH: 30.1 pg (ref 26.0–34.0)
MCHC: 33.6 g/dL (ref 30.0–36.0)
MCV: 89.4 fL (ref 80.0–100.0)
Platelets: 211 10*3/uL (ref 150–400)
RBC: 5.09 MIL/uL (ref 4.22–5.81)
RDW: 12.7 % (ref 11.5–15.5)
WBC: 6.1 10*3/uL (ref 4.0–10.5)
nRBC: 0 % (ref 0.0–0.2)

## 2023-03-05 MED ORDER — DAPAGLIFLOZIN PROPANEDIOL 10 MG PO TABS: 10.0000 mg | ORAL_TABLET | Freq: Every day | ORAL | 11 refills | Status: DC

## 2023-03-05 NOTE — Patient Instructions (Addendum)
Thank you for coming in today  If you had labs drawn today, any labs that are abnormal the clinic will call you No news is good news  You will be scheduled for a Cardiac MRI and will be called to schedule   Medications: START Farxiga 10 mg 1 tablet daily  Follow up appointments:  Your physician recommends that you return for lab work in: 10 days BMET  Your physician recommends that you schedule a follow-up appointment in:  3 weeks in pharmacy 2 months with Dr. Shirlee Latch with echocardiogram  Your physician has requested that you have an echocardiogram. Echocardiography is a painless test that uses sound waves to create images of your heart. It provides your doctor with information about the size and shape of your heart and how well your heart's chambers and valves are working. This procedure takes approximately one hour. There are no restrictions for this procedure.       Do the following things EVERYDAY: Weigh yourself in the morning before breakfast. Write it down and keep it in a log. Take your medicines as prescribed Eat low salt foods--Limit salt (sodium) to 2000 mg per day.  Stay as active as you can everyday Limit all fluids for the day to less than 2 liters   At the [facility], you and your health needs are our priority. As part of our continuing mission to provide you with exceptional heart care, we have created designated Provider Care Teams. These Care Teams include your primary Cardiologist (physician) and Advanced Practice Providers (APPs- Physician Assistants and Nurse Practitioners) who all work together to provide you with the care you need, when you need it.   You may see any of the following providers on your designated Care Team at your next follow up: Dr Arvilla Meres Dr Marca Ancona Dr. Marcos Eke, NP Robbie Lis, [facility] [place], [place] Brynda Peon, NP Karle Plumber, PharmD   Please be sure to  bring in all your medications bottles to every appointment.    Thank you for choosing [facility]  If you have any questions or concerns before your next appointment please send [place] a message through [place] or call our office at [phone].    TO LEAVE A MESSAGE FOR THE NURSE SELECT OPTION 2, PLEASE LEAVE A MESSAGE INCLUDING: YOUR NAME DATE OF BIRTH CALL BACK NUMBER REASON FOR CALL**this is important as we prioritize the call backs  YOU WILL RECEIVE A CALL BACK THE SAME DAY AS LONG AS YOU CALL BEFORE 4:00 PM

## 2023-03-15 ENCOUNTER — Ambulatory Visit (HOSPITAL_COMMUNITY)
Admission: RE | Admit: 2023-03-15 | Discharge: 2023-03-15 | Disposition: A | Discharge status: Home / Self Care | Payer: Managed Care, Other (non HMO) | Source: Ambulatory Visit | Attending: Cardiology | Admitting: Cardiology

## 2023-03-15 DIAGNOSIS — I5022 Chronic systolic (congestive) heart failure: Secondary | ICD-10-CM | POA: Diagnosis present

## 2023-03-15 LAB — BASIC METABOLIC PANEL
Anion gap: 8 (ref 5–15)
BUN: 10 mg/dL (ref 8–23)
CO2: 26 mmol/L (ref 22–32)
Calcium: 9.3 mg/dL (ref 8.9–10.3)
Chloride: 106 mmol/L (ref 98–111)
Creatinine, Ser: 1.1 mg/dL (ref 0.61–1.24)
GFR, Estimated: >60 mL/min (ref >60)
Glucose, Bld: 101 mg/dL — ABNORMAL HIGH (ref 70–99)
Potassium: 4.3 mmol/L (ref 3.5–5.1)
Sodium: 140 mmol/L (ref 135–145)

## 2023-03-19 NOTE — Progress Notes (Signed)
[facility] Note   PCP: Gerre Scull, NP Cardiology: Dr. Mayford Knife HF Cardiology: Dr. Shirlee Latch  HPI:  62 y.o. with history of OSA, HTN, gout, and cardiomyopathy was referred by Dr. Mayford Knife for evaluation of CHF.  Patient was diagnosed with CHF in 2018, echo at that time showed EF 20-25%.  Cath showed no significant CAD in 04/2017. He was started on GDMT and EF gradually increased, echo in 02/2021 with EF 45-50%, and echo in 01/2022 with EF 50%.  After the 01/2022 echo, patient felt good so he stopped all his medications.  Echo was repeated in 01/2023, showing EF back down to 25-30% with regional WMAs and moderate RV dysfunction.  He restarted on lisinopril and metoprolol XL.   LHC/RHC 02/2023: Normal filling pressures, preserved cardiac output, no significant CAD.   Presented to AHF for follow up 03/05/23. Overall was feeling good. Denied palpitations, CP, dizziness, edema, or PND/Orthopnea. No SOB unless he tried to run. Appetite was good, eats out a lot with being on the road but does try to watch what he eats. No fever or chills. Does not weigh at home. Reported taking all medications. Denied ETOH, smoking. 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    Today he returns to HF clinic for pharmacist medication titration. At last visit with APP, Farxiga 10 mg daily was initiated. Overall he is feeling well today. No dizziness, lightheadedness, CP or palpitations. Notes fatigue when he is out in the sun for a long period of time. No SOB/DOE. Does not need a loop diuretic. No LEE on exam. Does note some leg swelling when he drives or sits up for a long time, but this goes away after laying down. Appetite is fine, he states he plans to start a diet soon. Taking all medications as prescribed and tolerating all medications.    HF Medications: Metoprolol succinate 25 mg daily Entresto 24/26 mg BID Spironolactone 25 mg every other day Farxiga 10 mg daily  Has the patient been  experiencing any side effects to the medications prescribed?  no  Does the patient have any problems obtaining medications due to transportation or finances?   Chartered loss adjuster. Provided Sherryll Burger and [place] copay cards today. Sent medications in for 90 day supplies.   Understanding of regimen: good Understanding of indications: good Potential of compliance: good Patient understands to avoid NSAIDs. Patient understands to avoid decongestants.    Pertinent Lab Values: 03/15/23: Serum creatinine 1.10, BUN 10, Potassium 4.3, Sodium 140  Vital Signs: Weight: 275.8 lbs (last clinic weight: 271.6 lbs) Blood pressure: 124/70 - has not taken morning medications yet today (has not eaten) Heart rate: 63   Assessment/Plan: 1. Chronic systolic CHF: Cardiomyopathy was diagnosed in 2018 with echo showing EF 20-25%,  cath at that time showed no CAD so nonischemic cardiomyopathy.  Over the years, EF improved until it was up to 50% on echo in 01/2022.  Patient stopped his medications, and echo in 01/2023 showed EF 25-30%, regional wall motion abnormalities, moderate RV dysfunction.  Patient may have had a cardiomyopathy that improved with GDMT then worsened when he stopped all his meds. LHC/RHC 5/24: Normal filling pressures, preserved cardiac output, no significant CAD - NYHA class I. Volume appears stable.  - Continue metoprolol XL 25 mg daily.  - Continue Entresto 24/26 mg BID - Increase spironolactone to 25 mg daily - Continue Farxiga 10 mg daily.  - Needs cardiac MRI with unremarkable cath. Scheduled for  04/2023 - Repeat echo scheduled for 05/07/2023.   2. OSA: Continue CPAP.  3. HTN: Medication titration as above.  -BP controlled  Follow up 1 month with Dr. Jonn Shingles, PharmD, BCPS, [facility], CPP Heart Failure Clinic Pharmacist [phone]

## 2023-03-29 ENCOUNTER — Institutional Professional Consult (permissible substitution): Admit: 2023-03-29 | Discharge: 2023-03-30 | Payer: MEDICARE

## 2023-03-29 DIAGNOSIS — C9210 Chronic myeloid leukemia, BCR/ABL-positive, not having achieved remission: Principal | ICD-10-CM

## 2023-04-01 ENCOUNTER — Ambulatory Visit (HOSPITAL_COMMUNITY)
Admission: RE | Admit: 2023-04-01 | Discharge: 2023-04-01 | Disposition: A | Discharge status: Home / Self Care | Payer: Managed Care, Other (non HMO) | Source: Ambulatory Visit | Attending: Cardiology | Admitting: Cardiology

## 2023-04-01 ENCOUNTER — Other Ambulatory Visit (HOSPITAL_COMMUNITY): Payer: Self-pay

## 2023-04-01 VITALS — BP 124/70 | HR 63 | Wt 275.8 lb

## 2023-04-01 DIAGNOSIS — I5022 Chronic systolic (congestive) heart failure: Secondary | ICD-10-CM | POA: Diagnosis present

## 2023-04-01 DIAGNOSIS — M109 Gout, unspecified: Secondary | ICD-10-CM | POA: Diagnosis not present

## 2023-04-01 DIAGNOSIS — I429 Cardiomyopathy, unspecified: Secondary | ICD-10-CM | POA: Diagnosis not present

## 2023-04-01 DIAGNOSIS — I10 Essential (primary) hypertension: Secondary | ICD-10-CM | POA: Insufficient documentation

## 2023-04-01 DIAGNOSIS — G4733 Obstructive sleep apnea (adult) (pediatric): Secondary | ICD-10-CM | POA: Diagnosis not present

## 2023-04-01 MED ORDER — FARXIGA 10 MG PO TABS: 10.0000 mg | ORAL_TABLET | Freq: Every day | ORAL | 3 refills | Status: DC

## 2023-04-01 MED ORDER — ATORVASTATIN CALCIUM 40 MG PO TABS: 40.0000 mg | ORAL_TABLET | Freq: Every day | ORAL | 3 refills | Status: AC

## 2023-04-01 MED ORDER — SPIRONOLACTONE 25 MG PO TABS: 25.0000 mg | ORAL_TABLET | Freq: Every day | ORAL | 3 refills | Status: DC

## 2023-04-01 MED ORDER — ENTRESTO 24-26 MG PO TABS: 1.0000 | ORAL_TABLET | Freq: Two times a day (BID) | ORAL | 3 refills | Status: DC

## 2023-04-01 MED ORDER — METOPROLOL SUCCINATE ER 25 MG PO TB24: 25.0000 mg | ORAL_TABLET | Freq: Every day | ORAL | 3 refills | Status: DC

## 2023-04-01 NOTE — Patient Instructions (Signed)
It was a pleasure seeing you today! ° °MEDICATIONS: °-We are changing your medications today °-Increase spironolactone to 25 mg (1 tablet) daily. °-Call if you have questions about your medications. ° ° °NEXT APPOINTMENT: °Return to clinic in 1 month with Dr. McLean. ° °In general, to take care of your heart failure: °-Limit your fluid intake to 2 Liters (half-gallon) per day.   °-Limit your salt intake to ideally 2-3 grams (2000-3000 mg) per day. °-Weigh yourself daily and record, and bring that "weight diary" to your next appointment.  (Weight gain of 2-3 pounds in 1 day typically means fluid weight.) °-The medications for your heart are to help your heart and help you live longer.   °-Please contact us before stopping any of your heart medications. ° °Call the clinic at [phone] with questions or to reschedule future appointments.  °

## 2023-04-16 LAB — COLOGUARD: COLOGUARD: NEGATIVE

## 2023-04-22 ENCOUNTER — Encounter: Payer: Self-pay | Admitting: Cardiology

## 2023-04-22 ENCOUNTER — Ambulatory Visit: Payer: Managed Care, Other (non HMO) | Attending: Cardiology | Admitting: Cardiology

## 2023-04-26 ENCOUNTER — Institutional Professional Consult (permissible substitution): Admit: 2023-04-26 | Discharge: 2023-04-27 | Payer: MEDICARE

## 2023-04-26 DIAGNOSIS — C9210 Chronic myeloid leukemia, BCR/ABL-positive, not having achieved remission: Principal | ICD-10-CM

## 2023-05-03 ENCOUNTER — Telehealth (HOSPITAL_COMMUNITY): Payer: Self-pay | Admitting: *Deleted

## 2023-05-03 NOTE — Telephone Encounter (Signed)
Reaching out to patient to offer assistance regarding upcoming cardiac imaging study; pt verbalizes understanding of appt date/time, parking situation and where to check in, and verified current allergies; name and call back number provided for further questions should they arise ? ?Merle Prescott RN Navigator Cardiac Imaging ?[facility] ?[phone] office ?[phone] cell ? ?Patient denies metal or claustrophobia. ?

## 2023-05-06 ENCOUNTER — Other Ambulatory Visit (HOSPITAL_COMMUNITY): Payer: Self-pay | Admitting: Cardiology

## 2023-05-06 ENCOUNTER — Ambulatory Visit (HOSPITAL_COMMUNITY)
Admission: RE | Admit: 2023-05-06 | Discharge: 2023-05-06 | Disposition: A | Discharge status: Home / Self Care | Payer: Managed Care, Other (non HMO) | Source: Ambulatory Visit | Attending: Cardiology | Admitting: Cardiology

## 2023-05-06 DIAGNOSIS — I5022 Chronic systolic (congestive) heart failure: Secondary | ICD-10-CM

## 2023-05-06 MED ORDER — GADOBUTROL 1 MMOL/ML IV SOLN
10.0000 mL | Freq: Once | INTRAVENOUS | Status: AC | PRN
  Administered 2023-05-06: 10 mL via INTRAVENOUS

## 2023-05-07 ENCOUNTER — Ambulatory Visit (HOSPITAL_COMMUNITY)
Admission: RE | Admit: 2023-05-07 | Discharge: 2023-05-07 | Disposition: A | Discharge status: Home / Self Care | Payer: Managed Care, Other (non HMO) | Source: Ambulatory Visit | Attending: Cardiology | Admitting: Cardiology

## 2023-05-07 ENCOUNTER — Ambulatory Visit (HOSPITAL_BASED_OUTPATIENT_CLINIC_OR_DEPARTMENT_OTHER)
Admission: RE | Admit: 2023-05-07 | Discharge: 2023-05-07 | Disposition: A | Discharge status: Home / Self Care | Payer: Managed Care, Other (non HMO) | Source: Ambulatory Visit | Attending: Cardiology | Admitting: Cardiology

## 2023-05-07 ENCOUNTER — Encounter (HOSPITAL_COMMUNITY): Payer: Self-pay | Admitting: Cardiology

## 2023-05-07 VITALS — BP 124/70 | HR 52 | Wt 280.2 lb

## 2023-05-07 DIAGNOSIS — I110 Hypertensive heart disease with heart failure: Secondary | ICD-10-CM | POA: Insufficient documentation

## 2023-05-07 DIAGNOSIS — Z79899 Other long term (current) drug therapy: Secondary | ICD-10-CM | POA: Insufficient documentation

## 2023-05-07 DIAGNOSIS — I5022 Chronic systolic (congestive) heart failure: Secondary | ICD-10-CM

## 2023-05-07 DIAGNOSIS — I5042 Chronic combined systolic (congestive) and diastolic (congestive) heart failure: Secondary | ICD-10-CM | POA: Diagnosis not present

## 2023-05-07 DIAGNOSIS — I5030 Unspecified diastolic (congestive) heart failure: Secondary | ICD-10-CM

## 2023-05-07 DIAGNOSIS — I517 Cardiomegaly: Secondary | ICD-10-CM | POA: Diagnosis not present

## 2023-05-07 DIAGNOSIS — G4733 Obstructive sleep apnea (adult) (pediatric): Secondary | ICD-10-CM | POA: Diagnosis not present

## 2023-05-07 DIAGNOSIS — I081 Rheumatic disorders of both mitral and tricuspid valves: Secondary | ICD-10-CM | POA: Insufficient documentation

## 2023-05-07 LAB — BRAIN NATRIURETIC PEPTIDE: B Natriuretic Peptide: 76.4 pg/mL (ref 0.0–100.0)

## 2023-05-07 LAB — BASIC METABOLIC PANEL
Anion gap: 9 (ref 5–15)
BUN: 20 mg/dL (ref 8–23)
CO2: 23 mmol/L (ref 22–32)
Calcium: 9.4 mg/dL (ref 8.9–10.3)
Chloride: 107 mmol/L (ref 98–111)
Creatinine, Ser: 1.22 mg/dL (ref 0.61–1.24)
GFR, Estimated: >60 mL/min (ref >60)
Glucose, Bld: 100 mg/dL — ABNORMAL HIGH (ref 70–99)
Potassium: 4.2 mmol/L (ref 3.5–5.1)
Sodium: 139 mmol/L (ref 135–145)

## 2023-05-07 LAB — ECHOCARDIOGRAM COMPLETE
Area-P 1/2: 2.11 cm2
Calc EF: 29.7 %
MV VTI: 4.69 cm2
S' Lateral: 5 cm
Single Plane A2C EF: 33 %
Single Plane A4C EF: 28.4 %

## 2023-05-07 MED ORDER — ENTRESTO 49-51 MG PO TABS: 1.0000 | ORAL_TABLET | Freq: Two times a day (BID) | ORAL | 11 refills | Status: DC

## 2023-05-07 NOTE — Progress Notes (Signed)
  Echocardiogram 2D Echocardiogram has been performed.  [name] 05/07/2023, 9:07 AM

## 2023-05-07 NOTE — Progress Notes (Signed)
PCP: Gerre Scull, NP Cardiology: Dr. Mayford Knife HF Cardiology: Dr. Shirlee Latch  62 y.o. with history of OSA, HTN, gout, and cardiomyopathy was referred by Dr. Mayford Knife for evaluation of CHF.  Patient was diagnosed with CHF in 2018, echo at that time showed EF 20-25%.  Cath showed no significant CAD in 7/18. He was started on GDMT and EF gradually increased, echo in 5/22 with EF 45-50%, and echo in 4/23 with EF 50%.  After the 4/23 echo, patient felt good so he stopped all his medications.  Echo was repeated in 4/24, showing EF back down to 25-30% with regional WMAs and moderate RV dysfunction.  He restarted on lisinopril and Toprol XL about a week ago.   LHC/RHC 5/24 showed normal filling pressures, preserved cardiac output, no significant CAD.  Echo today showed EF 25-30%, global hypokinesis, mild LV dilation, mildly decreased RV systolic function.  Cardiac MRI in 7/24 showed LV EF 28%, RV EF 35%, subtle mid-wall LGE in the basal septum, prominent subepicardial LGE in the basal to mid inferolateral wall.   Today he returns for followup of CHF.  Patient reports no significant exertional dyspnea or chest pain.  No lightheadedness or syncope. He has sleep apnea and uses CPAP.  No orthopnea/PND.   ECG (personally reviewed): NSR, LAFB  Labs (7/23): LDL 74, K 3.8, creatinine 1.61, hgb 12.7 Labs (5/24): K 3.6, SCr .94, TSH 1.8, Hgb A1c 5.4, LDL 71 Labs (6/24): K 4.3, creatinine 1.1  PMH: 1. OSA: CPAP 2. HTN 3. Hyperlipidemia 4. Gout 5. Chronic systolic CHF: Diagnosed in 2018, nonischemic cardiomyopathy.  - Echo (2018): EF 20-25%, mild LVH - LHC (7/18): Normal coronaries - Echo (5/19): EF 40-45% - Echo (4/21): EF 45-50% - Echo (5/22): EF 45-50% - PYP scan (9/22): grade 1, H/CL 1-1.5. Equivocal. - Echo (4/23): EF 50% - Echo (4/24): EF 25-30%, regional wall motion abnormalities, moderate RV dysfunction - LHC/RHC 5/24: mean RA 4, PA 25/9, mean PCWP 6, CI 2.24; no significant CAD - Echo (7/24): EF  25-30%, global hypokinesis, mild LV dilation, mildly decreased RV systolic function.  - Cardiac MRI (7/24): LV EF 28%, RV EF 35%, subtle mid-wall LGE in the basal septum, prominent subepicardial LGE in the basal to mid inferolateral wall.   SH: Truck driver, nonsmoker, rare ETOH, widowed.    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   Current Outpatient Medications  Medication Sig Dispense Refill   aspirin 81 MG chewable tablet Chew 1 tablet (81 mg total) by mouth daily.     atorvastatin (LIPITOR) 40 MG tablet Take 1 tablet (40 mg total) by mouth daily. 90 tablet 3   FARXIGA 10 MG TABS tablet Take 1 tablet (10 mg total) by mouth daily before breakfast. 90 tablet 3   metoprolol succinate (TOPROL-XL) 25 MG 24 hr tablet Take 1 tablet (25 mg total) by mouth daily. 90 tablet 3   sacubitril-valsartan (ENTRESTO) 49-51 MG Take 1 tablet by mouth 2 (two) times daily. 60 tablet 11   spironolactone (ALDACTONE) 25 MG tablet Take 1 tablet (25 mg total) by mouth daily. 90 tablet 3   No current facility-administered medications for this encounter.   BP 124/70   Pulse (!) 52   Wt 127.1 kg (280 lb 3.2 oz)   SpO2 96%   BMI 40.20 kg/m  General: NAD Neck: No JVD, no thyromegaly or thyroid nodule.  Lungs: Clear to auscultation bilaterally with normal  respiratory effort. CV: Nondisplaced PMI.  Heart regular S1/S2, no S3/S4, no murmur.  No peripheral edema.  No carotid bruit.  Normal pedal pulses.  Abdomen: Soft, nontender, no hepatosplenomegaly, no distention.  Skin: Intact without lesions or rashes.  Neurologic: Alert and oriented x 3.  Psych: Normal affect. Extremities: No clubbing or cyanosis.  HEENT: Normal.   Wt Readings from Last 3 Encounters:  05/07/23 127.1 kg (280 lb 3.2 oz)  04/01/23 125.1 kg (275 lb 12.8 oz)  03/05/23 123.2 kg (271 lb 9.6 oz)   Assessment/Plan: 1. Chronic systolic CHF: Cardiomyopathy was diagnosed in  2018 with echo showing EF 20-25%, cath at that time showed no CAD so nonischemic cardiomyopathy.  Over the years, EF improved until it was up to 50% on echo in 4/23.  Patient stopped his medications, and echo in 4/24 showed EF 25-30%, regional wall motion abnormalities, moderate RV dysfunction.  Patient may have had a cardiomyopathy that improved with GDMT then worsened when he stopped all his meds. Cath in 5/24 showed normal filling pressures, no significant CAD. Cardiac MRI in 7/24 showed LV EF 28%, RV EF 35%, subtle mid-wall LGE in the basal septum, prominent subepicardial LGE in the basal to mid inferolateral wall.  Echo today showed EF 25-30% with mild RV dysfunction.  Cause of cardiomyopathy is uncertain, based on LGE pattern, I am concerned for possible cardiac sarcoidosis versus prior myocarditis vs possible familial CMP (mother had "heart problems," he knows nothing about his father's family). NYHA class I-II. Volume appears stable.  - I would like him to get a cardiac PET to assess for evidence for cardiac sarcoidosis.  I will also arrange for high resolution CT chest to look for pulmonary sarcoidosis as well as an ACE level.  - [facility] gene testing for common familial cardiomyopathies.  - Continue Toprol XL 25 mg daily.  - Increase Entresto to 49/51 bid, BMET/BNP today and in 10 days.  - Continue spiro 25 mg daily.  - Continue Farxiga 10 mg daily.  - Echo in 3 months to assess for ICD placement. Narrow QRS, so not CRT candidate.  2. OSA: Continue CPAP.  3. HTN: BP controlled.   Follow-up in 3 weeks with PharmD to try to optimize GDMT. Followup 6 wks with APP and 3 months with echo with me.   Marca Ancona  05/07/2023

## 2023-05-07 NOTE — Patient Instructions (Signed)
INCREASE Entresto to 49/51 mg Twice daily  Labs done today, your results will be available in MyChart, we will contact you for abnormal readings.  Repeat labs in 10 days.  Genetic test has been done, this has to be sent to [place] to be processed and can take 1-2 weeks to get results back.  We will let you know the results.  Your physician has requested that you have an echocardiogram. Echocardiography is a painless test that uses sound waves to create images of your heart. It provides your doctor with information about the size and shape of your heart and how well your heart's chambers and valves are working. This procedure takes approximately one hour. There are no restrictions for this procedure. Please do NOT wear cologne, perfume, aftershave, or lotions (deodorant is allowed). Please arrive 15 minutes prior to your appointment time.   Your provider has ordered a high resolution CT Scan. You will be called to have this test arranged.  Your physician recommends that you schedule a follow-up appointment in: 3 months with an echocardiogram (October) .[name] PLEASE CALL THE OFFICE IN MID AUGUST TO ARRANGE YOUR FOLLOW UP APPOINTMENT. **   Cardiac Sarcoidosis/Inflammation PET Scan  Food Diary Name: _____________________________ Please fill in EXACTLY what you have eaten and when for 24 hours PRIOR to your test date.  Time Food/Drink Comments  Breakfast                Lunch                Dinner                Snacks                 DO NOT EXERCISE THE DAY BEFORE YOUR TEST DO NOT EAT AFTER 5 PM THE DAY BEFORE YOUR TEST.  ON THE DAY OF YOUR TEST, DO NOT EAT ANY FOOD AND ONLY DRINK CLEAR WATER! PLEASE BRING THIS FOOD DIARY WITH YOU TO YOUR APPOINTMENT  Cardiac Sarcoidosis/Inflammation PET Scan Patient Instructions  Please report to Radiology at the [facility] Main Entrance 15 minutes early for your test.  [address] [place], [place] [address] BRING FOOD  DIARY WITH YOU TO THIS APPOINTMENT For 24 hours before the test: Do not exercise! Do not eat after 5 pm the day before your test! To make sure that your scanning results are accurate, you MUST follow the sarcoid prep meal diet starting the day before your PET scan. This diet involves eating no carbohydrates 24 hours before the test.  You will keep a log of all that you eat the day before your test. If you have questions or do not understand this diet, please call [phone] for more information. If you are unable to follow this diet, please discuss an alternative strategy with the coordinator.  If you are diabetic, continue your diabetes medications as usual on the day before until you begin to fast. NO DIABETES MEDICATIONS ONCE YOU BEGIN TO FAST. What foods can I eat the day before my test?  Drink only water or black coffee (WITHOUT sugar, artificial sweetener, cream, or milk). Eggs (prepared without milk or cheese)  Meat that is either broiled or pan fried in butter WITHOUT breading (chicken, [place], bacon, meat-only sausage, hamburger, steak, fish) Butter, salt & pepper What foods must I AVOID the day before my test?  Do not consume alcoholic beverages, sodas, fruit juice, coffee creamer, or sports drinks  Do  not eat vegetables, beans, nuts, fruits, juices, bread, grains, rice, pasta, potatoes, or any baked goods Do not eat dairy products (milk, cheese, etc.)  Do not eat mayonnaise, ketchup, tartar sauce, mustard, or other condiments Do not add sugar, artificial sweeteners, or Splenda (sucralose) to foods or drinks  Do not eat breaded foods (like fried chicken)  Do not eat sweets, candy, gum, sweetened cough drops, lozenges, or sugar  Do not eat sweetened, grilled, or cured meats or meat with carbohydrate-containing additives (some sausages, ham, sweetened bacon) Suggested items for breakfast, lunch, or dinner:  Breakfast  3 to 5 fatty sausage links fried in butter. 3 to 5 bacon  strips.  3 eggs pan fried in butter (no milk or cheese).  Lunch/Dinner  2 hamburger patties fried in butter. Chicken or fatty fish pan fried in butter. No breading. 8 oz. fatty steak pan fried in butter.  Beverages  Drink only water or black coffee. DO NOT ADD SUGAR, ARTIFICIAL SWEETENER, CREAM, OR MILK   For more information and frequently asked questions, please visit our website : [email or web address]  If you have any questions or concerns before your next appointment please send [place] a message through [facility] or call our office at [phone].    TO LEAVE A MESSAGE FOR THE NURSE SELECT OPTION 2, PLEASE LEAVE A MESSAGE INCLUDING: YOUR NAME DATE OF BIRTH CALL BACK NUMBER REASON FOR CALL**this is important as we prioritize the call backs  YOU WILL RECEIVE A CALL BACK THE SAME DAY AS LONG AS YOU CALL BEFORE 4:00 PM At the [facility], you and your health needs are our priority. As part of our continuing mission to provide you with exceptional heart care, we have created designated Provider Care Teams. These Care Teams include your primary Cardiologist (physician) and Advanced Practice Providers (APPs- Physician Assistants and Nurse Practitioners) who all work together to provide you with the care you need, when you need it.   You may see any of the following providers on your designated Care Team at your next follow up: Dr Arvilla Meres Dr Marca Ancona Dr. Marcos Eke, NP Robbie Lis, [place] [facility] [place], [place] Brynda Peon, NP Karle Plumber, PharmD   Please be sure to bring in all your medications bottles to every appointment.    Thank you for choosing [facility]

## 2023-05-20 ENCOUNTER — Other Ambulatory Visit: Admit: 2023-05-20 | Discharge: 2023-05-21 | Payer: MEDICARE

## 2023-05-20 ENCOUNTER — Ambulatory Visit (HOSPITAL_COMMUNITY)
Admission: RE | Admit: 2023-05-20 | Discharge: 2023-05-20 | Disposition: A | Discharge status: Home / Self Care | Payer: Managed Care, Other (non HMO) | Source: Ambulatory Visit | Attending: Cardiology | Admitting: Cardiology

## 2023-05-20 DIAGNOSIS — I5042 Chronic combined systolic (congestive) and diastolic (congestive) heart failure: Secondary | ICD-10-CM | POA: Diagnosis present

## 2023-05-20 LAB — BASIC METABOLIC PANEL
Anion gap: 9 (ref 5–15)
BUN: 12 mg/dL (ref 8–23)
CO2: 23 mmol/L (ref 22–32)
Calcium: 9.2 mg/dL (ref 8.9–10.3)
Chloride: 105 mmol/L (ref 98–111)
Creatinine, Ser: 1.02 mg/dL (ref 0.61–1.24)
GFR, Estimated: >60 mL/min (ref >60)
Glucose, Bld: 99 mg/dL (ref 70–99)
Potassium: 4.1 mmol/L (ref 3.5–5.1)
Sodium: 137 mmol/L (ref 135–145)

## 2023-05-21 ENCOUNTER — Telehealth (HOSPITAL_COMMUNITY): Payer: Self-pay | Admitting: Vascular Surgery

## 2023-05-21 ENCOUNTER — Telehealth (HOSPITAL_COMMUNITY): Payer: Self-pay | Admitting: *Deleted

## 2023-05-21 NOTE — Telephone Encounter (Signed)
Auth for CT approved    Routed to [facility] to schedule high resolution CT

## 2023-05-21 NOTE — Telephone Encounter (Signed)
LVM giving ct appt, asked pt to call back to confirm

## 2023-05-21 NOTE — Telephone Encounter (Signed)
Cardiac PET scan pending   Service [id number]

## 2023-05-24 ENCOUNTER — Institutional Professional Consult (permissible substitution): Admit: 2023-05-24 | Discharge: 2023-05-25 | Payer: MEDICARE

## 2023-05-24 ENCOUNTER — Ambulatory Visit (HOSPITAL_COMMUNITY): Payer: Managed Care, Other (non HMO)

## 2023-05-24 DIAGNOSIS — C9210 Chronic myeloid leukemia, BCR/ABL-positive, not having achieved remission: Principal | ICD-10-CM

## 2023-05-28 DIAGNOSIS — C9210 Chronic myeloid leukemia, BCR/ABL-positive, not having achieved remission: Principal | ICD-10-CM

## 2023-06-04 ENCOUNTER — Ambulatory Visit (HOSPITAL_COMMUNITY)
Admission: RE | Admit: 2023-06-04 | Discharge: 2023-06-04 | Disposition: A | Discharge status: Home / Self Care | Payer: Managed Care, Other (non HMO) | Source: Ambulatory Visit | Attending: Cardiology | Admitting: Cardiology

## 2023-06-04 VITALS — BP 128/72 | HR 64

## 2023-06-04 DIAGNOSIS — I5042 Chronic combined systolic (congestive) and diastolic (congestive) heart failure: Secondary | ICD-10-CM | POA: Diagnosis present

## 2023-06-04 DIAGNOSIS — I110 Hypertensive heart disease with heart failure: Secondary | ICD-10-CM | POA: Insufficient documentation

## 2023-06-04 DIAGNOSIS — Z7984 Long term (current) use of oral hypoglycemic drugs: Secondary | ICD-10-CM | POA: Insufficient documentation

## 2023-06-04 DIAGNOSIS — I5022 Chronic systolic (congestive) heart failure: Secondary | ICD-10-CM | POA: Diagnosis not present

## 2023-06-04 DIAGNOSIS — I429 Cardiomyopathy, unspecified: Secondary | ICD-10-CM | POA: Diagnosis not present

## 2023-06-04 DIAGNOSIS — G4733 Obstructive sleep apnea (adult) (pediatric): Secondary | ICD-10-CM | POA: Insufficient documentation

## 2023-06-04 DIAGNOSIS — Z79899 Other long term (current) drug therapy: Secondary | ICD-10-CM | POA: Diagnosis not present

## 2023-06-04 NOTE — Patient Instructions (Signed)
It was a pleasure seeing you today!  MEDICATIONS: -No medication changes today -Call if you have questions about your medications.   NEXT APPOINTMENT: Return to clinic in 3 weeks with APP Clinic.   Call the clinic at [phone] with questions or to reschedule future appointments.

## 2023-06-04 NOTE — Progress Notes (Addendum)
[facility] Note   PCP: Gerre Scull, NP Cardiology: Dr. Mayford Knife HF Cardiology: Dr. Shirlee Latch  HPI:  62 y.o. with history of OSA, HTN, gout, and cardiomyopathy was referred by Dr. Mayford Knife for evaluation of CHF.  Patient was diagnosed with CHF in 2018, echo at that time showed EF 20-25%.  Cath showed no significant CAD in 04/2017. He was started on GDMT and EF gradually increased, echo in 02/2021 with EF 45-50%, and echo in 01/2022 with EF 50%.  After the 01/2022 echo, patient felt good so he stopped all his medications.  Echo was repeated in 01/2023, showing EF back down to 25-30% with regional WMAs and moderate RV dysfunction.  He was restarted on lisinopril and metoprolol XL.   LHC/RHC 02/2023 showed normal filling pressures, preserved cardiac output, no significant CAD.  Echo showed EF 25-30%, global hypokinesis, mild LV dilation, mildly decreased RV systolic function.  Cardiac MRI in 04/2023 showed LV EF 28%, RV EF 35%, subtle mid-wall LGE in the basal septum, prominent subepicardial LGE in the basal to mid inferolateral wall.    He returned for followup of CHF on 05/07/23 with Dr. Shirlee Latch.  Patient reported no significant exertional dyspnea or chest pain.  No lightheadedness or syncope. He has sleep apnea and uses CPAP.  No orthopnea/PND.  Today he returns to HF clinic for pharmacist medication titration. At last visit with Dr. Shirlee Latch, Sherryll Burger was increased to 49/51 mg bid. Overall he has been feeling fair. He denied dizziness or lightheadedness. No abnormal fatigue. No CP or palpitations. No SOB/ DOE. Patient has been able to walk for 10 minutes on flat ground without getting SOB and he does this 5X per week. BP 128/72 in clinic, but he had not taken his morning medications. Notes an increase in weight but he believes this to be due to dietary indiscretion. He does not need a loop diuretic.    HF Medications: metoprolol succinate 25 mg daily Entresto 49/51 mg  bid spironolactone 25 mg daily Farxiga 10 mg daily  Has the patient been experiencing any side effects to the medications prescribed?  No  Does the patient have any problems obtaining medications due to transportation or finances?   No [employer] commerical Understanding of regimen: good Understanding of indications: good Potential of compliance: fair Patient understands to avoid NSAIDs. Patient understands to avoid decongestants.    Pertinent Lab Values: Labs 05/20/23: Serum creatinine 1.02, BUN 12, Potassium 4.1, Sodium 137, BNP 76.4 (05/07/23)  Vital Signs: Weight: 288.2 lbs (last clinic weight: 280 lbs) Blood pressure: 128/72  Heart rate: 64   Assessment/Plan:  1. Chronic systolic CHF: Cardiomyopathy was diagnosed in 2018 with echo showing EF 20-25%, cath at that time showed no CAD so nonischemic cardiomyopathy.  Over the years, EF improved until it was up to 50% on echo in 01/2022.  Patient stopped his medications, and echo in 01/2023 showed EF 25-30%, regional wall motion abnormalities, moderate RV dysfunction.  Patient may have had a cardiomyopathy that improved with GDMT then worsened when he stopped all his meds. Cath in 02/2023 showed normal filling pressures, no significant CAD. Cardiac MRI in 04/2023 showed LV EF 28%, RV EF 35%, subtle mid-wall LGE in the basal septum, prominent subepicardial LGE in the basal to mid inferolateral wall.  Echo today showed EF 25-30% with mild RV dysfunction.  Cause of cardiomyopathy is uncertain, based on LGE pattern, there is concern for possible cardiac sarcoidosis versus prior myocarditis vs possible familial CMP (  mother had "heart problems," he knows nothing about his father's family).  - NYHA class I-II. Volume appears stable.  -Referred last visit for cardiac PET to assess for evidence for cardiac sarcoidosis.  High resolution CT chest to look for pulmonary sarcoidosis completed today, results pending.  - [employer] gene testing for common familial  cardiomyopathies negative.  - Continue metoprolol succinate 25 mg daily. Will not increase today as HR 64 and he has not taken his morning medications. HR was 52 bpm last visit.  - Continue Entresto 49/51 mg bid. Will not increase today as patient had not taken his morning medications.  - Continue spironolactone 25 mg daily.  - Continue Farxiga 10 mg daily.  - Echo in 3 months to assess for ICD placement. Narrow QRS, so not CRT candidate.  2. OSA: Continue CPAP.  3. HTN: BP controlled.   Follow up 06/25/23 with NP/PA.    Karle Plumber, PharmD, BCPS, BCCP, CPP Heart Failure Clinic Pharmacist [phone]

## 2023-06-11 ENCOUNTER — Telehealth (HOSPITAL_COMMUNITY): Payer: Self-pay

## 2023-06-11 NOTE — Telephone Encounter (Signed)
Spoke with patient regarding the following results. Patient made aware and patient verbalized understanding.

## 2023-06-11 NOTE — Telephone Encounter (Signed)
-----   Message from Marca Ancona sent at 06/11/2023  1:28 PM EDT ----- No evidence for interstitial lung disease or sarcoidosis.

## 2023-06-18 ENCOUNTER — Telehealth (HOSPITAL_COMMUNITY): Payer: Self-pay | Admitting: *Deleted

## 2023-06-18 NOTE — Telephone Encounter (Signed)
[facility] Cardiomyopathy Comprehensive Panel:  Collected: 05/07/23  Result: Negative   Pt aware

## 2023-06-21 ENCOUNTER — Institutional Professional Consult (permissible substitution): Admit: 2023-06-21 | Discharge: 2023-06-22 | Payer: MEDICARE

## 2023-06-21 DIAGNOSIS — C9210 Chronic myeloid leukemia, BCR/ABL-positive, not having achieved remission: Principal | ICD-10-CM

## 2023-06-24 NOTE — Progress Notes (Signed)
PCP: Gerre Scull, NP Cardiology: Dr. Mayford Knife HF Cardiology: Dr. Shirlee Latch  62 y.o. with history of OSA, HTN, gout, and cardiomyopathy was referred by Dr. Mayford Knife for evaluation of CHF.  Patient was diagnosed with CHF in 2018, echo at that time showed EF 20-25%.  Cath showed no significant CAD in 7/18. He was started on GDMT and EF gradually increased, echo in 5/22 with EF 45-50%, and echo in 4/23 with EF 50%.  After the 4/23 echo, patient felt good so he stopped all his medications.  Echo was repeated in 4/24, showing EF back down to 25-30% with regional WMAs and moderate RV dysfunction.  He restarted on lisinopril and Toprol XL about a week ago.   LHC/RHC 5/24 showed normal filling pressures, preserved cardiac output, no significant CAD.  Echo today showed EF 25-30%, global hypokinesis, mild LV dilation, mildly decreased RV systolic function.  Cardiac MRI in 7/24 showed LV EF 28%, RV EF 35%, subtle mid-wall LGE in the basal septum, prominent subepicardial LGE in the basal to mid inferolateral wall.   Today he returns for followup of CHF.  Patient reports no significant exertional dyspnea or chest pain.  No lightheadedness or syncope. He has sleep apnea and uses CPAP.  No orthopnea/PND.   ECG (personally reviewed): NSR, LAFB  Labs (7/23): LDL 74, K 3.8, creatinine 9.52, hgb 12.7 Labs (5/24): K 3.6, SCr .94, TSH 1.8, Hgb A1c 5.4, LDL 71 Labs (6/24): K 4.3, creatinine 1.1  PMH: 1. OSA: CPAP 2. HTN 3. Hyperlipidemia 4. Gout 5. Chronic systolic CHF: Diagnosed in 2018, nonischemic cardiomyopathy.  - Echo (2018): EF 20-25%, mild LVH - LHC (7/18): Normal coronaries - Echo (5/19): EF 40-45% - Echo (4/21): EF 45-50% - Echo (5/22): EF 45-50% - PYP scan (9/22): grade 1, H/CL 1-1.5. Equivocal. - Echo (4/23): EF 50% - Echo (4/24): EF 25-30%, regional wall motion abnormalities, moderate RV dysfunction - LHC/RHC 5/24: mean RA 4, PA 25/9, mean PCWP 6, CI 2.24; no significant CAD - Echo (7/24): EF  25-30%, global hypokinesis, mild LV dilation, mildly decreased RV systolic function.  - Cardiac MRI (7/24): LV EF 28%, RV EF 35%, subtle mid-wall LGE in the basal septum, prominent subepicardial LGE in the basal to mid inferolateral wall.   SH: Truck driver, nonsmoker, rare ETOH, widowed.    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   Current Outpatient Medications  Medication Sig Dispense Refill   aspirin 81 MG chewable tablet Chew 1 tablet (81 mg total) by mouth daily.     atorvastatin (LIPITOR) 40 MG tablet Take 1 tablet (40 mg total) by mouth daily. 90 tablet 3   FARXIGA 10 MG TABS tablet Take 1 tablet (10 mg total) by mouth daily before breakfast. 90 tablet 3   metoprolol succinate (TOPROL-XL) 25 MG 24 hr tablet Take 1 tablet (25 mg total) by mouth daily. 90 tablet 3   sacubitril-valsartan (ENTRESTO) 49-51 MG Take 1 tablet by mouth 2 (two) times daily. 60 tablet 11   spironolactone (ALDACTONE) 25 MG tablet Take 1 tablet (25 mg total) by mouth daily. 90 tablet 3   No current facility-administered medications for this visit.   There were no vitals taken for this visit. General: NAD Neck: No JVD, no thyromegaly or thyroid nodule.  Lungs: Clear to auscultation bilaterally with normal respiratory effort. CV: Nondisplaced PMI.  Heart regular S1/S2, no S3/S4, no murmur.  No peripheral edema.  No carotid bruit.  Normal pedal pulses.  Abdomen: Soft, nontender, no hepatosplenomegaly, no distention.  Skin: Intact without lesions or rashes.  Neurologic: Alert and oriented x 3.  Psych: Normal affect. Extremities: No clubbing or cyanosis.  HEENT: Normal.   Wt Readings from Last 3 Encounters:  05/07/23 127.1 kg (280 lb 3.2 oz)  04/01/23 125.1 kg (275 lb 12.8 oz)  03/05/23 123.2 kg (271 lb 9.6 oz)   Assessment/Plan: 1. Chronic systolic CHF: Cardiomyopathy was diagnosed in 2018 with echo showing EF 20-25%, cath at that time  showed no CAD so nonischemic cardiomyopathy.  Over the years, EF improved until it was up to 50% on echo in 4/23.  Patient stopped his medications, and echo in 4/24 showed EF 25-30%, regional wall motion abnormalities, moderate RV dysfunction.  Patient may have had a cardiomyopathy that improved with GDMT then worsened when he stopped all his meds. Cath in 5/24 showed normal filling pressures, no significant CAD. Cardiac MRI in 7/24 showed LV EF 28%, RV EF 35%, subtle mid-wall LGE in the basal septum, prominent subepicardial LGE in the basal to mid inferolateral wall.  Echo today showed EF 25-30% with mild RV dysfunction.  Cause of cardiomyopathy is uncertain, based on LGE pattern, I am concerned for possible cardiac sarcoidosis versus prior myocarditis vs possible familial CMP (mother had "heart problems," he knows nothing about his father's family). NYHA class I-II. Volume appears stable.  - I would like him to get a cardiac PET to assess for evidence for cardiac sarcoidosis.  I will also arrange for high resolution CT chest to look for pulmonary sarcoidosis as well as an ACE level.  - [employer] gene testing for common familial cardiomyopathies.  - Continue Toprol XL 25 mg daily.  - Increase Entresto to 49/51 bid, BMET/BNP today and in 10 days.  - Continue spiro 25 mg daily.  - Continue Farxiga 10 mg daily.  - Echo in 3 months to assess for ICD placement. Narrow QRS, so not CRT candidate.  2. OSA: Continue CPAP.  3. HTN: BP controlled.   Follow-up in 3 weeks with PharmD to try to optimize GDMT. Followup 6 wks with APP and 3 months with echo with me.   [facility]  06/24/2023

## 2023-06-25 ENCOUNTER — Ambulatory Visit (HOSPITAL_COMMUNITY)
Admission: RE | Admit: 2023-06-25 | Discharge: 2023-06-25 | Disposition: A | Discharge status: Home / Self Care | Payer: Managed Care, Other (non HMO) | Source: Ambulatory Visit | Attending: Family Medicine | Admitting: Family Medicine

## 2023-06-25 ENCOUNTER — Encounter (HOSPITAL_COMMUNITY): Payer: Self-pay

## 2023-06-25 VITALS — BP 124/82 | HR 55 | Wt 291.0 lb

## 2023-06-25 DIAGNOSIS — G4733 Obstructive sleep apnea (adult) (pediatric): Secondary | ICD-10-CM | POA: Diagnosis not present

## 2023-06-25 DIAGNOSIS — I5022 Chronic systolic (congestive) heart failure: Secondary | ICD-10-CM | POA: Diagnosis not present

## 2023-06-25 DIAGNOSIS — Z79899 Other long term (current) drug therapy: Secondary | ICD-10-CM | POA: Insufficient documentation

## 2023-06-25 DIAGNOSIS — I10 Essential (primary) hypertension: Secondary | ICD-10-CM | POA: Diagnosis not present

## 2023-06-25 DIAGNOSIS — I428 Other cardiomyopathies: Secondary | ICD-10-CM | POA: Diagnosis not present

## 2023-06-25 DIAGNOSIS — I110 Hypertensive heart disease with heart failure: Secondary | ICD-10-CM | POA: Insufficient documentation

## 2023-06-25 DIAGNOSIS — I509 Heart failure, unspecified: Secondary | ICD-10-CM | POA: Diagnosis present

## 2023-06-25 MED ORDER — SPIRONOLACTONE 25 MG PO TABS: 25.0000 mg | ORAL_TABLET | Freq: Every day | ORAL | 3 refills | Status: DC

## 2023-06-25 NOTE — Patient Instructions (Signed)
RESTART Spironolactone 25 mg daily.  Blood work in 1 week.  Your physician has requested that you have an echocardiogram. Echocardiography is a painless test that uses sound waves to create images of your heart. It provides your doctor with information about the size and shape of your heart and how well your heart's chambers and valves are working. This procedure takes approximately one hour. There are no restrictions for this procedure. Please do NOT wear cologne, perfume, aftershave, or lotions (deodorant is allowed). Please arrive 15 minutes prior to your appointment time.  Your physician recommends that you schedule a follow-up appointment in: 6 weeks with an echocardiogram and Dr. Shirlee Latch  If you have any questions or concerns before your next appointment please send [place] a message through [facility] or call our office at [phone].    TO LEAVE A MESSAGE FOR THE NURSE SELECT OPTION 2, PLEASE LEAVE A MESSAGE INCLUDING: YOUR NAME DATE OF BIRTH CALL BACK NUMBER REASON FOR CALL**this is important as we prioritize the call backs  YOU WILL RECEIVE A CALL BACK THE SAME DAY AS LONG AS YOU CALL BEFORE 4:00 PM  At the [facility], you and your health needs are our priority. As part of our continuing mission to provide you with exceptional heart care, we have created designated Provider Care Teams. These Care Teams include your primary Cardiologist (physician) and Advanced Practice Providers (APPs- Physician Assistants and Nurse Practitioners) who all work together to provide you with the care you need, when you need it.   You may see any of the following providers on your designated Care Team at your next follow up: Dr Arvilla Meres Dr Marca Ancona Dr. Marcos Eke, NP Robbie Lis, [place] [facility] [place], [place] Brynda Peon, NP Karle Plumber, PharmD   Please be sure to bring in all your medications bottles to every appointment.    Thank you  for choosing [facility]

## 2023-06-27 ENCOUNTER — Other Ambulatory Visit (HOSPITAL_COMMUNITY): Payer: Self-pay | Admitting: Cardiology

## 2023-07-02 ENCOUNTER — Ambulatory Visit (HOSPITAL_COMMUNITY)
Admission: RE | Admit: 2023-07-02 | Discharge: 2023-07-02 | Disposition: A | Discharge status: Home / Self Care | Payer: Managed Care, Other (non HMO) | Source: Ambulatory Visit | Attending: Cardiology | Admitting: Cardiology

## 2023-07-02 DIAGNOSIS — I5022 Chronic systolic (congestive) heart failure: Secondary | ICD-10-CM | POA: Diagnosis present

## 2023-07-02 LAB — BASIC METABOLIC PANEL
Anion gap: 9 (ref 5–15)
BUN: 8 mg/dL (ref 8–23)
CO2: 24 mmol/L (ref 22–32)
Calcium: 9.1 mg/dL (ref 8.9–10.3)
Chloride: 103 mmol/L (ref 98–111)
Creatinine, Ser: 1.14 mg/dL (ref 0.61–1.24)
GFR, Estimated: >60 mL/min (ref >60)
Glucose, Bld: 90 mg/dL (ref 70–99)
Potassium: 3.9 mmol/L (ref 3.5–5.1)
Sodium: 136 mmol/L (ref 135–145)

## 2023-07-17 ENCOUNTER — Ambulatory Visit: Admit: 2023-07-17 | Discharge: 2023-07-17 | Payer: MEDICARE

## 2023-07-17 ENCOUNTER — Encounter: Admit: 2023-07-17 | Discharge: 2023-07-17 | Payer: MEDICARE

## 2023-07-17 DIAGNOSIS — C9210 Chronic myeloid leukemia, BCR/ABL-positive, not having achieved remission: Principal | ICD-10-CM

## 2023-07-18 ENCOUNTER — Ambulatory Visit: Admit: 2023-07-18 | Discharge: 2023-07-19 | Payer: MEDICARE

## 2023-07-18 ENCOUNTER — Institutional Professional Consult (permissible substitution): Admit: 2023-07-18 | Discharge: 2023-07-19 | Payer: MEDICARE

## 2023-07-18 DIAGNOSIS — C9210 Chronic myeloid leukemia, BCR/ABL-positive, not having achieved remission: Principal | ICD-10-CM

## 2023-07-23 ENCOUNTER — Encounter: Admit: 2023-07-23 | Discharge: 2023-07-23 | Payer: MEDICARE

## 2023-07-31 ENCOUNTER — Ambulatory Visit (HOSPITAL_COMMUNITY)
Admission: RE | Admit: 2023-07-31 | Discharge: 2023-07-31 | Disposition: A | Discharge status: Home / Self Care | Payer: Managed Care, Other (non HMO) | Source: Ambulatory Visit | Attending: Cardiology | Admitting: Cardiology

## 2023-07-31 ENCOUNTER — Encounter (HOSPITAL_COMMUNITY): Payer: Self-pay | Admitting: Cardiology

## 2023-07-31 VITALS — BP 120/78 | HR 53 | Wt 281.4 lb

## 2023-07-31 DIAGNOSIS — I110 Hypertensive heart disease with heart failure: Secondary | ICD-10-CM | POA: Insufficient documentation

## 2023-07-31 DIAGNOSIS — G4733 Obstructive sleep apnea (adult) (pediatric): Secondary | ICD-10-CM | POA: Insufficient documentation

## 2023-07-31 DIAGNOSIS — I5042 Chronic combined systolic (congestive) and diastolic (congestive) heart failure: Secondary | ICD-10-CM | POA: Insufficient documentation

## 2023-07-31 DIAGNOSIS — Z7984 Long term (current) use of oral hypoglycemic drugs: Secondary | ICD-10-CM | POA: Insufficient documentation

## 2023-07-31 DIAGNOSIS — Z79899 Other long term (current) drug therapy: Secondary | ICD-10-CM | POA: Insufficient documentation

## 2023-07-31 LAB — ECHOCARDIOGRAM COMPLETE
AR max vel: 4.12 cm2
AV Area VTI: 3.81 cm2
AV Area mean vel: 3.96 cm2
AV Mean grad: 4 mm[Hg]
AV Peak grad: 6.9 mm[Hg]
Ao pk vel: 1.31 m/s
Area-P 1/2: 2.82 cm2
S' Lateral: 5.1 cm
Weight: 4502.4 [oz_av]

## 2023-07-31 MED ORDER — ENTRESTO 97-103 MG PO TABS: 1.0000 | ORAL_TABLET | Freq: Two times a day (BID) | ORAL | 11 refills | Status: DC

## 2023-07-31 NOTE — Patient Instructions (Signed)
INCREASE Entresto to 97/103 mg Twice daily  Labs done today, your results will be available in MyChart, we will contact you for abnormal readings.  Repeat blood work in 10 days.  Your physician recommends that you schedule a follow-up appointment in: 3 months.  If you have any questions or concerns before your next appointment please send [place] a message through [facility] or call our office at [phone].    TO LEAVE A MESSAGE FOR THE NURSE SELECT OPTION 2, PLEASE LEAVE A MESSAGE INCLUDING: YOUR NAME DATE OF BIRTH CALL BACK NUMBER REASON FOR CALL**this is important as we prioritize the call backs  YOU WILL RECEIVE A CALL BACK THE SAME DAY AS LONG AS YOU CALL BEFORE 4:00 PM  At the [facility], you and your health needs are our priority. As part of our continuing mission to provide you with exceptional heart care, we have created designated Provider Care Teams. These Care Teams include your primary Cardiologist (physician) and Advanced Practice Providers (APPs- Physician Assistants and Nurse Practitioners) who all work together to provide you with the care you need, when you need it.   You may see any of the following providers on your designated Care Team at your next follow up: Dr Arvilla Meres Dr Marca Ancona Dr. Dorthula Nettles Dr. Clearnce Hasten Amy Filbert Schilder, NP Robbie Lis, [place] [facility] [place], [place] Brynda Peon, NP [place] Lee, NP Karle Plumber, PharmD   Please be sure to bring in all your medications bottles to every appointment.    Thank you for choosing [facility]

## 2023-07-31 NOTE — Progress Notes (Signed)
PCP: Gerre Scull, NP Cardiology: Dr. Mayford Knife HF Cardiology: Dr. Shirlee Latch  62 y.o. with history of OSA, HTN, gout, and cardiomyopathy was referred by Dr. Mayford Knife for evaluation of CHF.  Patient was diagnosed with CHF in 2018, echo at that time showed EF 20-25%.  Cath showed no significant CAD in 7/18. He was started on GDMT and EF gradually increased, echo in 5/22 with EF 45-50%, and echo in 4/23 with EF 50%.  After the 4/23 echo, patient felt good so he stopped all his medications.  Echo was repeated in 4/24, showing EF back down to 25-30% with regional WMAs and moderate RV dysfunction.  He restarted on lisinopril and Toprol XL about a week ago.   LHC/RHC 5/24 showed normal filling pressures, preserved cardiac output, no significant CAD.  Echo 7/24 showed EF 25-30%, global hypokinesis, mild LV dilation, mildly decreased RV systolic function.  Cardiac MRI in 7/24 showed LV EF 28%, RV EF 35%, subtle mid-wall LGE in the basal septum, prominent subepicardial LGE in the basal to mid inferolateral wall.   High resolution CT chest in 8/24 showed no sign of pulmonary sarcoidosis.   Echo was done today and reviewed, EF 25-30%, normal RV, IVC normal.   Today he returns for HF follow up. Weight is down 10 lbs.  He has no dyspnea except with heavy activity.  No chest pain.  No lightheadedness.  No palpitations.  Overall, feeling good.   ECG (personally reviewed): NSR, normal  Labs (7/23): LDL 74, K 3.8, creatinine 3.24, hgb 12.7 Labs (5/24): K 3.6, SCr .94, TSH 1.8, Hgb A1c 5.4, LDL 71 Labs (6/24): K 4.3, creatinine 1.1 Labs (8/24): K 4.1, creatinine 1.02 Labs (9/24): K 3.9, creatinine 1.14  PMH: 1. OSA: CPAP 2. HTN 3. Hyperlipidemia 4. Gout 5. Chronic systolic CHF: Diagnosed in 2018, nonischemic cardiomyopathy.  - Echo (2018): EF 20-25%, mild LVH - LHC (7/18): Normal coronaries - Echo (5/19): EF 40-45% - Echo (4/21): EF 45-50% - Echo (5/22): EF 45-50% - PYP scan (9/22): grade 1, H/CL 1-1.5.  Equivocal. - Echo (4/23): EF 50% - Echo (4/24): EF 25-30%, regional wall motion abnormalities, moderate RV dysfunction - LHC/RHC 5/24: mean RA 4, PA 25/9, mean PCWP 6, CI 2.24; no significant CAD - Echo (7/24): EF 25-30%, global hypokinesis, mild LV dilation, mildly decreased RV systolic function.  - Cardiac MRI (7/24): LV EF 28%, RV EF 35%, subtle mid-wall LGE in the basal septum, prominent subepicardial LGE in the basal to mid inferolateral wall.  - Echo (10/24): EF 25-30%, normal RV, IVC normal.  6. High resolution CT chest in 8/24 showed no sign of pulmonary sarcoidosis.   SH: Truck driver, nonsmoker, rare ETOH, widowed.    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   Current Outpatient Medications  Medication Sig Dispense Refill   atorvastatin (LIPITOR) 40 MG tablet Take 1 tablet (40 mg total) by mouth daily. 90 tablet 3   FARXIGA 10 MG TABS tablet Take 1 tablet (10 mg total) by mouth daily before breakfast. 90 tablet 3   metoprolol succinate (TOPROL-XL) 25 MG 24 hr tablet Take 1 tablet (25 mg total) by mouth daily. 90 tablet 3   sacubitril-valsartan (ENTRESTO) 97-103 MG Take 1 tablet by mouth 2 (two) times daily. 60 tablet 11   spironolactone (ALDACTONE) 25 MG tablet Take 1 tablet (25 mg total) by mouth daily. 90 tablet 3   aspirin 81 MG chewable  tablet Chew 1 tablet (81 mg total) by mouth daily. (Patient not taking: Reported on 06/25/2023)     No current facility-administered medications for this encounter.   Wt Readings from Last 3 Encounters:  07/31/23 127.6 kg (281 lb 6.4 oz)  06/25/23 132 kg (291 lb)  05/07/23 127.1 kg (280 lb 3.2 oz)   BP 120/78   Pulse (!) 53   Wt 127.6 kg (281 lb 6.4 oz)   SpO2 97%   BMI 40.38 kg/m  General: NAD Neck: No JVD, no thyromegaly or thyroid nodule.  Lungs: Clear to auscultation bilaterally with normal respiratory effort. CV: Nondisplaced PMI.  Heart regular S1/S2, no  S3/S4, no murmur.  No peripheral edema.  No carotid bruit.  Normal pedal pulses.  Abdomen: Soft, nontender, no hepatosplenomegaly, no distention.  Skin: Intact without lesions or rashes.  Neurologic: Alert and oriented x 3.  Psych: Normal affect. Extremities: No clubbing or cyanosis.  HEENT: Normal.    Assessment/Plan: 1. Chronic systolic CHF: Cardiomyopathy was diagnosed in 2018 with echo showing EF 20-25%, cath at that time showed no CAD so nonischemic cardiomyopathy.  Over the years, EF improved until it was up to 50% on echo in 4/23.  Patient stopped his medications, and echo in 4/24 showed EF 25-30%, regional wall motion abnormalities, moderate RV dysfunction.  Patient may have had a cardiomyopathy that improved with GDMT then worsened when he stopped all his meds. Cath in 5/24 showed normal filling pressures, no significant CAD. Cardiac MRI in 7/24 showed LV EF 28%, RV EF 35%, subtle mid-wall LGE in the basal septum, prominent subepicardial LGE in the basal to mid inferolateral wall.  Echo 7/24 showed EF 25-30% with mild RV dysfunction. Echo today showed EF 25-30%, normal RV, IVC normal.  Cause of cardiomyopathy is uncertain, based on LGE pattern, I am concerned for possible cardiac sarcoidosis versus prior myocarditis vs possible familial CMP (mother had "heart problems," he knows nothing about his father's family), however [employer] gene testing for common familial cardiomyopathies was negative. High resolution CT chest in 8/24 showed no sign of pulmonary sarcoidosis.  NYHA class I-II. He is not volume overloaded on exam.  - I will order a cardiac PET to assess for evidence for cardiac sarcoidosis.   - Continue spironolactone 25 mg daily.  - Continue Toprol XL 25 mg daily.  - Increase Entresto to 97/103 bid, BMET/BNP today and BMET in 10 days.  - Continue Farxiga 10 mg daily.  - We discussed ICD today, he is clear that he is not interested.  2. OSA: Continue CPAP.  3. HTN: BP controlled.    Followup 3 months with APP  Marca Ancona  07/31/2023

## 2023-08-05 ENCOUNTER — Encounter: Payer: Managed Care, Other (non HMO) | Admitting: Nurse Practitioner

## 2023-08-15 ENCOUNTER — Ambulatory Visit: Admit: 2023-08-15 | Payer: MEDICARE

## 2023-08-16 ENCOUNTER — Institutional Professional Consult (permissible substitution): Admit: 2023-08-16 | Discharge: 2023-08-17 | Payer: MEDICARE

## 2023-08-16 DIAGNOSIS — C9210 Chronic myeloid leukemia, BCR/ABL-positive, not having achieved remission: Principal | ICD-10-CM

## 2023-08-27 ENCOUNTER — Telehealth: Payer: Self-pay | Admitting: *Deleted

## 2023-09-02 ENCOUNTER — Other Ambulatory Visit (HOSPITAL_COMMUNITY): Payer: Managed Care, Other (non HMO)

## 2023-09-02 ENCOUNTER — Ambulatory Visit (INDEPENDENT_AMBULATORY_CARE_PROVIDER_SITE_OTHER): Payer: Managed Care, Other (non HMO) | Admitting: Nurse Practitioner

## 2023-09-02 ENCOUNTER — Encounter: Payer: Self-pay | Admitting: Nurse Practitioner

## 2023-09-02 VITALS — BP 120/82 | HR 53 | Temp 97.0°F | Ht 70.0 in | Wt 272.0 lb

## 2023-09-02 DIAGNOSIS — Z125 Encounter for screening for malignant neoplasm of prostate: Secondary | ICD-10-CM

## 2023-09-02 DIAGNOSIS — Z0001 Encounter for general adult medical examination with abnormal findings: Secondary | ICD-10-CM | POA: Diagnosis not present

## 2023-09-02 DIAGNOSIS — I5042 Chronic combined systolic (congestive) and diastolic (congestive) heart failure: Secondary | ICD-10-CM | POA: Diagnosis not present

## 2023-09-02 DIAGNOSIS — R972 Elevated prostate specific antigen [PSA]: Secondary | ICD-10-CM

## 2023-09-02 DIAGNOSIS — M1A09X0 Idiopathic chronic gout, multiple sites, without tophus (tophi): Secondary | ICD-10-CM

## 2023-09-02 DIAGNOSIS — G4733 Obstructive sleep apnea (adult) (pediatric): Secondary | ICD-10-CM

## 2023-09-02 DIAGNOSIS — Z0000 Encounter for general adult medical examination without abnormal findings: Secondary | ICD-10-CM | POA: Insufficient documentation

## 2023-09-02 DIAGNOSIS — I10 Essential (primary) hypertension: Secondary | ICD-10-CM | POA: Diagnosis not present

## 2023-09-02 DIAGNOSIS — I4729 Other ventricular tachycardia: Secondary | ICD-10-CM

## 2023-09-02 LAB — URIC ACID: Uric Acid, Serum: 5.4 mg/dL (ref 4.0–7.8)

## 2023-09-02 LAB — BASIC METABOLIC PANEL
BUN: 12 mg/dL (ref 6–23)
CO2: 25 meq/L (ref 19–32)
Calcium: 9.7 mg/dL (ref 8.4–10.5)
Chloride: 104 meq/L (ref 96–112)
Creatinine, Ser: 1.03 mg/dL (ref 0.40–1.50)
GFR: 78.32 mL/min (ref >60.00)
Glucose, Bld: 91 mg/dL (ref 70–99)
Potassium: 4.1 meq/L (ref 3.5–5.1)
Sodium: 138 meq/L (ref 135–145)

## 2023-09-02 LAB — PSA: PSA: 5.05 ng/mL — ABNORMAL HIGH (ref 0.10–4.00)

## 2023-09-02 NOTE — Patient Instructions (Signed)
It was great to see you!  We are checking your labs today and will let you know the results via mychart/phone.   Let's follow-up in 6 months, sooner if you have concerns.  If a referral was placed today, you will be contacted for an appointment. Please note that routine referrals can sometimes take up to 3-4 weeks to process. Please call our office if you haven't heard anything after this time frame.  Take care,  Rodman Pickle, NP

## 2023-09-02 NOTE — Assessment & Plan Note (Signed)
Continue wearing CPAP at night.

## 2023-09-02 NOTE — Progress Notes (Signed)
BP 120/82 (BP Location: Right Arm)   Pulse (!) 53   Temp (!) 97 F (36.1 C)   Ht 5\' 10"  (1.778 m)   Wt 272 lb (123.4 kg)   SpO2 97%   BMI 39.03 kg/m    Subjective:    Patient ID: [name], male    DOB: [birth year], 62 y.o.   MRN: [id number]  CC: Chief Complaint  Patient presents with   Annual Exam    No concerns, has labs scheduled at [facility] after this appointment    HPI: [name] is a 62 y.o. male presenting on 09/02/2023 for comprehensive medical examination. Current medical complaints include:none  He currently lives with: kids  Depression and Anxiety Screen done today and results listed below:     09/02/2023    9:35 AM 01/28/2023    3:11 PM 05/26/2019   11:09 AM  Depression screen PHQ 2/9  Decreased Interest 0 0 0  Down, Depressed, Hopeless 0 0 0  PHQ - 2 Score 0 0 0  Altered sleeping 0 0 0  Tired, decreased energy 0 0 0  Change in appetite 0 0 0  Feeling bad or failure about yourself  0 0 0  Trouble concentrating 0 0 0  Moving slowly or fidgety/restless 0 0 0  Suicidal thoughts 0 0 0  PHQ-9 Score 0 0 0  Difficult doing work/chores Not difficult at all Not difficult at all       09/02/2023    9:36 AM 01/28/2023    3:11 PM 05/26/2019   11:09 AM  GAD 7 : Generalized Anxiety Score  Nervous, Anxious, on Edge 0 0 0  Control/stop worrying 0 0 0  Worry too much - different things 0 0 0  Trouble relaxing 0 0 0  Restless 0 0 0  Easily annoyed or irritable 0 0 0  Afraid - awful might happen 0 0 0  Total GAD 7 Score 0 0 0  Anxiety Difficulty Not difficult at all Not difficult at all     The patient does not have a history of falls. I did not complete a risk assessment for falls. A plan of care for falls was not documented.   Past Medical History:  Past Medical History:  Diagnosis Date   Arthritis    "knees" (06/28/2017)   Chronic combined systolic and diastolic heart failure (HCC) 04/19/2017   a. LHC 04/22/17 - Normal coronary  arteries; LVEDP 33 // Echo 04/21/17 - Mild LVH, EF 20-25, diff HK, restrictive physiology, mod to severe LAE, mild RVE, mild RAE, PASP 35.// Echo 11/18: Moderate concentric LVH, EF 35-40, diffuse HK, grade 1 diastolic dysfunction, mild LAE, mild RVE    History of gout    Migraine    "I had 1 in the 1990s" (06/28/2017)   NICM (nonischemic cardiomyopathy) (HCC) 04/23/2017   Obesity    Prediabetes    Sleep apnea 04/23/2017    Surgical History:  Past Surgical History:  Procedure Laterality Date   CARDIAC CATHETERIZATION  04/2017   patent coronary arteries/medical hx   EYE SURGERY Left    KNEE ARTHROSCOPY Left ~ 2008   LEFT HEART CATH AND CORONARY ANGIOGRAPHY [name] 04/22/2017   Procedure: Left Heart Cath and Coronary Angiography;  Surgeon: Lyn Records, MD;  Location[facility] INVAS[facility] CV LAB;  Service: Cardiovascular;  Laterality: [name];   RIGHT/LEFT HEART CATH AND CORONARY ANGIOGRAPHY [name] 02/19/2023   Procedure: RIGHT/LEFT HEART CATH AND CORONARY ANGIOGRAPHY;  Surgeon: Shirlee Latch,  Eliot Ford, MD;  Location: [facility] INVASIVE CV LAB;  Service: Cardiovascular;  Laterality: [name];    Medications:  Current Outpatient Medications on File Prior to Visit  Medication Sig   atorvastatin (LIPITOR) 40 MG tablet Take 1 tablet (40 mg total) by mouth daily.   FARXIGA 10 MG TABS tablet Take 1 tablet (10 mg total) by mouth daily before breakfast.   metoprolol succinate (TOPROL-XL) 25 MG 24 hr tablet Take 1 tablet (25 mg total) by mouth daily.   sacubitril-valsartan (ENTRESTO) 97-103 MG Take 1 tablet by mouth 2 (two) times daily.   spironolactone (ALDACTONE) 25 MG tablet Take 1 tablet (25 mg total) by mouth daily.   No current facility-administered medications on file prior to visit.    Allergies:  No Known Allergies  Social History:  Social History   Socioeconomic History   Marital status: Widowed    Spouse name: Not on file   Number of children: Not on file   Years of education: Not on file   Highest education  level: Not on file  Occupational History   Not on file  Tobacco Use   Smoking status: Never   Smokeless tobacco: Never  Vaping Use   Vaping status: Never Used  Substance and Sexual Activity   Alcohol use: No   Drug use: No   Sexual activity: Not Currently  Other Topics Concern   Not on file  Social History Narrative   Not on file   Social Determinants of Health   Financial Resource Strain: Not on file  Food Insecurity: Not on file  Transportation Needs: Not on file  Physical Activity: Not on file  Stress: Not on file  Social Connections: Not on file  Intimate Partner Violence: Not on file   Social History   Tobacco Use  Smoking Status Never  Smokeless Tobacco Never   Social History   Substance and Sexual Activity  Alcohol Use No    Family History:  Family History  Problem Relation Age of Onset   Heart disease Mother    Heart attack Maternal Uncle    Heart disease Maternal Grandmother     Past medical history, surgical history, medications, allergies, family history and social history reviewed with patient today and changes made to appropriate areas of the chart.   Review of Systems  Constitutional: Negative.   HENT: Negative.    Eyes: Negative.   Respiratory: Negative.    Cardiovascular: Negative.   Gastrointestinal: Negative.   Genitourinary: Negative.   Musculoskeletal: Negative.   Skin: Negative.   Neurological: Negative.   Psychiatric/Behavioral: Negative.     All other ROS negative except what is listed above and in the HPI.      Objective:    BP 120/82 (BP Location: Right Arm)   Pulse (!) 53   Temp (!) 97 F (36.1 C)   Ht 5\' 10"  (1.778 m)   Wt 272 lb (123.4 kg)   SpO2 97%   BMI 39.03 kg/m   Wt Readings from Last 3 Encounters:  09/02/23 272 lb (123.4 kg)  07/31/23 281 lb 6.4 oz (127.6 kg)  06/25/23 291 lb (132 kg)    Physical Exam Vitals and nursing note reviewed.  Constitutional:      General: He is not in acute distress.     Appearance: Normal appearance.  HENT:     Head: Normocephalic and atraumatic.     Right Ear: Tympanic membrane, ear canal and external ear normal.     Left Ear:  Tympanic membrane, ear canal and external ear normal.     Mouth/Throat:     Mouth: Mucous membranes are moist.     Pharynx: No posterior oropharyngeal erythema.  Eyes:     Conjunctiva/sclera: Conjunctivae normal.  Cardiovascular:     Rate and Rhythm: Normal rate and regular rhythm.     Pulses: Normal pulses.     Heart sounds: Normal heart sounds.  Pulmonary:     Effort: Pulmonary effort is normal.     Breath sounds: Normal breath sounds.  Abdominal:     Palpations: Abdomen is soft.     Tenderness: There is no abdominal tenderness.  Musculoskeletal:        General: Normal range of motion.     Cervical back: Normal range of motion and neck supple. No tenderness.     Right lower leg: No edema.     Left lower leg: No edema.  Lymphadenopathy:     Cervical: No cervical adenopathy.  Skin:    General: Skin is warm and dry.  Neurological:     General: No focal deficit present.     Mental Status: He is alert and oriented to person, place, and time.     Cranial Nerves: No cranial nerve deficit.     Coordination: Coordination normal.     Gait: Gait normal.  Psychiatric:        Mood and Affect: Mood normal.        Behavior: Behavior normal.        Thought Content: Thought content normal.        Judgment: Judgment normal.     Results for orders placed or performed during the hospital encounter of 07/31/23  ECHOCARDIOGRAM COMPLETE  Result Value Ref Range   Weight 4,502.4 oz   BP 120/78 mmHg   S' Lateral 5.10 cm   AV Area VTI 3.81 cm2   AV Mean grad 4.0 mmHg   AV Area mean vel 3.96 cm2   Area-P 1/2 2.82 cm2   AR max vel 4.12 cm2   AV Peak grad 6.9 mmHg   Ao pk vel 1.31 m/s   Est EF 25 - 30%       Assessment & Plan:   Problem List Items Addressed This Visit       Cardiovascular and Mediastinum   Chronic  combined systolic and diastolic heart failure (HCC) (Chronic)    Chronic, stable. Euvolemic on exam today. He is following with cardiology and recently had his entresto dose increased. He is scheduled for a BMP today at the heart failure clinic, but I called over there after talking with the patient and cancelled that appointment and he will have labs done here. Per the scheduler, he will not get billed for canceling this lab appointment within 24 hours. Will send results to Dr. Shirlee Latch and continue collaboration and recommendations from cardiology.       Relevant Orders   Basic metabolic panel   Hypertension (Chronic)    Chronic, stable. Continue entresto 97-103mg  BID, toprol XL 25mg  daily and spironolactone 25mg  every other day. Continue collaboration with cardiology. Follow-up in 6 months.       Relevant Orders   Basic metabolic panel   NSVT (nonsustained ventricular tachycardia) (HCC)     Respiratory   Sleep apnea    Continue wearing CPAP at night.        Musculoskeletal and Integument   Chronic gout of multiple sites    Chronic, stable.  He uses colchicine with  flareups.  Currently stable. Check uric acid today.       Relevant Orders   Uric acid     Other   Morbid obesity (HCC)    BMI 39 associated with heart failure, obstructive sleep apnea, and hypertension. Recommend weight loss.       Routine general medical examination at a health care facility - Primary    Health maintenance reviewed and updated. Discussed nutrition, exercise. Recent BMP, CBC, and lipid panel reviewed today. Follow-up 1 year.        Other Visit Diagnoses     Screening PSA (prostate specific antigen)       Screen PSA today   Relevant Orders   PSA        LABORATORY TESTING:  Health maintenance labs ordered today as discussed above.   The natural history of prostate cancer and ongoing controversy regarding screening and potential treatment outcomes of prostate cancer has been discussed with  the patient. The meaning of a false positive PSA and a false negative PSA has been discussed. He indicates understanding of the limitations of this screening test and wishes to proceed with screening PSA testing.   IMMUNIZATIONS:   - Tdap: Tetanus vaccination status reviewed: last tetanus booster within 10 years. - Influenza:  Declined - Pneumovax: Not applicable - Prevnar: Not applicable - HPV: Not applicable - Shingrix vaccine:  Declined  SCREENING: - Colonoscopy: Up to date  Discussed with patient purpose of the colonoscopy is to detect colon cancer at curable precancerous or early stages   - AAA Screening: Not applicable   PATIENT COUNSELING:    Sexuality: Discussed sexually transmitted diseases, partner selection, use of condoms, avoidance of unintended pregnancy  and contraceptive alternatives.   Advised to avoid cigarette smoking.  I discussed with the patient that most people either abstain from alcohol or drink within safe limits (<=14/week and <=4 drinks/occasion for males, <=7/weeks and <= 3 drinks/occasion for females) and that the risk for alcohol disorders and other health effects rises proportionally with the number of drinks per week and how often a drinker exceeds daily limits.  Discussed cessation/primary prevention of drug use and availability of treatment for abuse.   Diet: Encouraged to adjust caloric intake to maintain  or achieve ideal body weight, to reduce intake of dietary saturated fat and total fat, to limit sodium intake by avoiding high sodium foods and not adding table salt, and to maintain adequate dietary potassium and calcium preferably from fresh fruits, vegetables, and low-fat dairy products.    stressed the importance of regular exercise  Injury prevention: Discussed safety belts, safety helmets, smoke detector, smoking near bedding or upholstery.   Dental health: Discussed importance of regular tooth brushing, flossing, and dental visits.    Follow up plan: NEXT PREVENTATIVE PHYSICAL DUE IN 1 YEAR. Return in about 6 months (around 03/01/2024) for heart failure.  [place] A Klea Nall

## 2023-09-02 NOTE — Assessment & Plan Note (Signed)
Chronic, stable. Euvolemic on exam today. He is following with cardiology and recently had his entresto dose increased. He is scheduled for a BMP today at the heart failure clinic, but I called over there after talking with the patient and cancelled that appointment and he will have labs done here. Per the scheduler, he will not get billed for canceling this lab appointment within 24 hours. Will send results to Dr. Shirlee Latch and continue collaboration and recommendations from cardiology.

## 2023-09-02 NOTE — Assessment & Plan Note (Signed)
Chronic, stable.  He uses colchicine with flareups.  Currently stable. Check uric acid today.

## 2023-09-02 NOTE — Assessment & Plan Note (Signed)
BMI 39 associated with heart failure, obstructive sleep apnea, and hypertension. Recommend weight loss.

## 2023-09-02 NOTE — Addendum Note (Signed)
Addended by: Rodman Pickle A on: 09/02/2023 03:14 PM   Modules accepted: Orders

## 2023-09-02 NOTE — Assessment & Plan Note (Signed)
Chronic, stable. Continue entresto 97-103mg  BID, toprol XL 25mg  daily and spironolactone 25mg  every other day. Continue collaboration with cardiology. Follow-up in 6 months.

## 2023-09-02 NOTE — Assessment & Plan Note (Signed)
Health maintenance reviewed and updated. Discussed nutrition, exercise. Recent BMP, CBC, and lipid panel reviewed today. Follow-up 1 year.

## 2023-09-03 ENCOUNTER — Encounter (HOSPITAL_COMMUNITY): Payer: Self-pay

## 2023-09-03 ENCOUNTER — Telehealth: Payer: Self-pay

## 2023-09-03 NOTE — Telephone Encounter (Signed)
Gerre Scull, NP  Paulita Fujita, Elpidio Anis, CMA Cc: Laurey Morale, MD [place] - Please make sure he gets results below and to schedule a lab visit in 2 months.   LVM for patient to return call.

## 2023-09-09 NOTE — Telephone Encounter (Signed)
Noted  

## 2023-09-09 NOTE — Telephone Encounter (Signed)
Patient notified and appointment scheduled for lab work.

## 2023-09-09 NOTE — Telephone Encounter (Signed)
Pt is wanting a cb concerning his most recent lab results. Pt at   [phone] [facility])

## 2023-09-09 NOTE — Telephone Encounter (Signed)
I returned patient's call and he said that he has a stinging sensation when he has to urinate and questions if it could be from doubling up on Entresto. He said that he takes 2 tablets twice daily.

## 2023-09-09 NOTE — Telephone Encounter (Signed)
Patient notified of below message and will try drinking the water and if that does not help he will schedule an office visit to be seen.

## 2023-09-10 ENCOUNTER — Telehealth (HOSPITAL_COMMUNITY): Payer: Self-pay | Admitting: *Deleted

## 2023-09-10 NOTE — Telephone Encounter (Signed)
 Attempted to call patient regarding upcoming cardiac PET appointment. Unable to leave VM-VM not set up. Johney Frame RN Navigator Cardiac Imaging [facility] [phone] Office

## 2023-09-10 NOTE — Telephone Encounter (Signed)
Reaching out to patient to offer assistance regarding upcoming cardiac imaging study; pt verbalizes understanding of appt date/time, parking situation and where to check in, pre-test NPO status and verified current allergies; name and call back number provided for further questions should they arise  Larey Brick RN Navigator Cardiac Imaging [facility] [phone] office [phone] cell  Patient verbalized understanding diet prep instructions.

## 2023-09-12 ENCOUNTER — Institutional Professional Consult (permissible substitution): Admit: 2023-09-12 | Discharge: 2023-09-13 | Payer: MEDICARE

## 2023-09-12 ENCOUNTER — Ambulatory Visit (HOSPITAL_COMMUNITY)
Admission: RE | Admit: 2023-09-12 | Discharge: 2023-09-12 | Disposition: A | Discharge status: Home / Self Care | Payer: Managed Care, Other (non HMO) | Source: Ambulatory Visit | Attending: Cardiology | Admitting: Cardiology

## 2023-09-12 DIAGNOSIS — I5042 Chronic combined systolic (congestive) and diastolic (congestive) heart failure: Secondary | ICD-10-CM | POA: Insufficient documentation

## 2023-09-12 DIAGNOSIS — C9210 Chronic myeloid leukemia, BCR/ABL-positive, not having achieved remission: Principal | ICD-10-CM

## 2023-09-12 LAB — NM PET CT MYOCARDIAL SARCOIDOSIS
Nuc Stress EF: 31 %
Rest Nuclear Isotope Dose: 30 mCi

## 2023-09-12 MED ORDER — RUBIDIUM RB82 GENERATOR (RUBYFILL)
29.9600 | PACK | Freq: Once | INTRAVENOUS | Status: AC
  Administered 2023-09-12: 29.96 via INTRAVENOUS

## 2023-09-12 MED ORDER — FLUDEOXYGLUCOSE F - 18 (FDG) INJECTION: 8.9700 | Freq: Once | INTRAVENOUS | Status: DC

## 2023-09-16 ENCOUNTER — Telehealth (HOSPITAL_COMMUNITY): Payer: Self-pay | Admitting: *Deleted

## 2023-09-16 NOTE — Telephone Encounter (Signed)
Called patient per Dr. Shirlee Latch with following cardiac PET results:  "EF 31%, not suggestive of cardiac sarcoidosis."  Pt verbalized understanding of same. No further questions at this time.

## 2023-10-10 ENCOUNTER — Ambulatory Visit: Admit: 2023-10-10 | Discharge: 2023-10-11 | Payer: MEDICARE

## 2023-10-10 DIAGNOSIS — C9210 Chronic myeloid leukemia, BCR/ABL-positive, not having achieved remission: Principal | ICD-10-CM

## 2023-10-28 ENCOUNTER — Encounter (HOSPITAL_COMMUNITY): Payer: Managed Care, Other (non HMO)

## 2023-11-07 ENCOUNTER — Ambulatory Visit: Admit: 2023-11-07 | Discharge: 2023-11-08 | Payer: MEDICARE

## 2023-11-07 DIAGNOSIS — C9210 Chronic myeloid leukemia, BCR/ABL-positive, not having achieved remission: Principal | ICD-10-CM

## 2023-11-11 ENCOUNTER — Other Ambulatory Visit: Payer: Managed Care, Other (non HMO)

## 2023-11-15 ENCOUNTER — Telehealth (HOSPITAL_COMMUNITY): Payer: Self-pay

## 2023-11-15 NOTE — Telephone Encounter (Signed)
 Called and was unable to leave patient a voice message to confirm/remind patient of their appointment at the [facility] on 11/18/23.

## 2023-11-18 ENCOUNTER — Other Ambulatory Visit (INDEPENDENT_AMBULATORY_CARE_PROVIDER_SITE_OTHER): Payer: Managed Care, Other (non HMO)

## 2023-11-18 ENCOUNTER — Encounter (HOSPITAL_COMMUNITY): Payer: Self-pay

## 2023-11-18 ENCOUNTER — Ambulatory Visit (HOSPITAL_COMMUNITY)
Admission: RE | Admit: 2023-11-18 | Discharge: 2023-11-18 | Disposition: A | Discharge status: Home / Self Care | Payer: Managed Care, Other (non HMO) | Source: Ambulatory Visit | Attending: Adult Health | Admitting: Adult Health

## 2023-11-18 VITALS — BP 124/80 | HR 56 | Wt 279.4 lb

## 2023-11-18 DIAGNOSIS — M109 Gout, unspecified: Secondary | ICD-10-CM | POA: Diagnosis not present

## 2023-11-18 DIAGNOSIS — Z79899 Other long term (current) drug therapy: Secondary | ICD-10-CM | POA: Insufficient documentation

## 2023-11-18 DIAGNOSIS — I10 Essential (primary) hypertension: Secondary | ICD-10-CM

## 2023-11-18 DIAGNOSIS — Z7984 Long term (current) use of oral hypoglycemic drugs: Secondary | ICD-10-CM | POA: Insufficient documentation

## 2023-11-18 DIAGNOSIS — I428 Other cardiomyopathies: Secondary | ICD-10-CM | POA: Insufficient documentation

## 2023-11-18 DIAGNOSIS — I5022 Chronic systolic (congestive) heart failure: Secondary | ICD-10-CM

## 2023-11-18 DIAGNOSIS — G4733 Obstructive sleep apnea (adult) (pediatric): Secondary | ICD-10-CM

## 2023-11-18 DIAGNOSIS — R972 Elevated prostate specific antigen [PSA]: Secondary | ICD-10-CM

## 2023-11-18 DIAGNOSIS — I5042 Chronic combined systolic (congestive) and diastolic (congestive) heart failure: Secondary | ICD-10-CM

## 2023-11-18 DIAGNOSIS — I110 Hypertensive heart disease with heart failure: Secondary | ICD-10-CM | POA: Insufficient documentation

## 2023-11-18 LAB — PSA: PSA: 4.73 ng/mL — ABNORMAL HIGH (ref 0.10–4.00)

## 2023-11-18 NOTE — Patient Instructions (Signed)
 It was great to see you today! No medication changes are needed at this time.  Your physician recommends that you schedule a follow-up appointment in: 3 months  Do the following things EVERYDAY: Weigh yourself in the morning before breakfast. Write it down and keep it in a log. Take your medicines as prescribed Eat low salt foods--Limit salt (sodium) to 2000 mg per day.  Stay as active as you can everyday Limit all fluids for the day to less than 2 liters  At the [facility], you and your health needs are our priority. As part of our continuing mission to provide you with exceptional heart care, we have created designated Provider Care Teams. These Care Teams include your primary Cardiologist (physician) and Advanced Practice Providers (APPs- Physician Assistants and Nurse Practitioners) who all work together to provide you with the care you need, when you need it.   You may see any of the following providers on your designated Care Team at your next follow up: Dr Jules Oar Dr Peder Bourdon Dr. Alwin Baars Dr. Arta Lark Amy Marijane Shoulders, NP Ruddy Corral, [place] [facility] [place], [place] Dennise Fitz, NP [place] Lee, NP Luster Salters, PharmD   Please be sure to bring in all your medications bottles to every appointment.    Thank you for choosing [facility] .

## 2023-11-18 NOTE — Progress Notes (Signed)
 ADVANCED HF CLINIC NOTE   PCP: Odette Benjamin, NP Cardiology: Dr. Micael Adas HF Cardiology: Dr. Mitzie Anda  Chief complaint: Heart Failure   63 y.o. with history of OSA, HTN, gout, and HFrEF.    Patient was diagnosed with CHF in 2018, echo at that time showed EF 20-25%.  Cath showed no significant CAD in 7/18. He was started on GDMT and EF gradually increased, echo in 5/22 with EF 45-50%, and echo in 4/23 with EF 50%.  After the 4/23 echo, patient felt good so he stopped all his medications.  Echo was repeated in 4/24, showing EF back down to 25-30% with regional WMAs and moderate RV dysfunction.  He restarted on lisinopril  and Toprol  XL about a week ago.   Today he returns for HF follow up. Had lab work earlier today. Overall feeling fine. Denies SOB/PND/Orthopnea. Using CPAP nightly. Appetite ok. No fever or chills. Weight at home has been stable. Taking all medications. Continues to work full time as [employer].   PMH: 1. OSA: CPAP 2. HTN 3. Hyperlipidemia 4. Gout 5. Chronic systolic CHF: Diagnosed in 2018, nonischemic cardiomyopathy.  - Echo (2018): EF 20-25%, mild LVH - LHC (7/18): Normal coronaries - Echo (5/19): EF 40-45% - Echo (4/21): EF 45-50% - Echo (5/22): EF 45-50% - PYP scan (9/22): grade 1, H/CL 1-1.5. Equivocal. - Echo (4/23): EF 50% - Echo (4/24): EF 25-30%, regional wall motion abnormalities, moderate RV dysfunction - LHC/RHC 5/24: mean RA 4, PA 25/9, mean PCWP 6, CI 2.24; no significant CAD - Echo (7/24): EF 25-30%, global hypokinesis, mild LV dilation, mildly decreased RV systolic function.  - Cardiac MRI (7/24): LV EF 28%, RV EF 35%, subtle mid-wall LGE in the basal septum, prominent subepicardial LGE in the basal to mid inferolateral wall.  - Echo (10/24): EF 25-30%, normal RV, IVC normal. - Cardiac PET 12/24: EF 31%, not suggestive of cardiac sarcoidosis 6. High resolution CT chest in 8/24 showed no sign of pulmonary sarcoidosis.   SH: Truck driver,  nonsmoker, rare ETOH, widowed.    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   Current Outpatient Medications  Medication Sig Dispense Refill   atorvastatin  (LIPITOR ) 40 MG tablet Take 1 tablet (40 mg total) by mouth daily. 90 tablet 3   FARXIGA  10 MG TABS tablet Take 1 tablet (10 mg total) by mouth daily before breakfast. 90 tablet 3   metoprolol  succinate (TOPROL -XL) 25 MG 24 hr tablet Take 1 tablet (25 mg total) by mouth daily. 90 tablet 3   sacubitril-valsartan (ENTRESTO ) 97-103 MG Take 1 tablet by mouth 2 (two) times daily. 60 tablet 11   spironolactone  (ALDACTONE ) 25 MG tablet Take 1 tablet (25 mg total) by mouth daily. 90 tablet 3   No current facility-administered medications for this encounter.   Wt Readings from Last 3 Encounters:  11/18/23 126.7 kg (279 lb 6 oz)  09/02/23 123.4 kg (272 lb)  07/31/23 127.6 kg (281 lb 6.4 oz)   BP 124/80 (Cuff Size: Large)   Pulse (!) 56   Wt 126.7 kg (279 lb 6 oz)   SpO2 97%   BMI 40.09 kg/m  General:  Well appearing. No resp difficulty Neck: supple. no JVD. Carotids 2+ bilat; no bruits. No lymphadenopathy or thryomegaly appreciated. Cor: PMI nondisplaced. Regular rate & rhythm. No rubs, gallops or murmurs. Lungs: clear Abdomen: soft, nontender, nondistended. No hepatosplenomegaly. No bruits or masses. Good bowel  sounds. Extremities: no cyanosis, clubbing, rash, edema Neuro: alert & orientedx3, cranial nerves grossly intact. moves all 4 extremities w/o difficulty. Affect pleasant   Assessment/Plan: 1. Chronic systolic CHF: Cardiomyopathy was diagnosed in 2018 with echo showing EF 20-25%, cath at that time showed no CAD so nonischemic cardiomyopathy.  Over the years, EF improved until it was up to 50% on echo in 4/23.  Patient stopped his medications, and echo in 4/24 showed EF 25-30%, regional wall motion abnormalities, moderate RV dysfunction.  Patient may have had a  cardiomyopathy that improved with GDMT then worsened when he stopped all his meds. Cath in 5/24 showed normal filling pressures, no significant CAD. Cardiac MRI in 7/24 showed LV EF 28%, RV EF 35%, subtle mid-wall LGE in the basal septum, prominent subepicardial LGE in the basal to mid inferolateral wall.  Echo 7/24 showed EF 25-30% with mild RV dysfunction. Echo 10/24: EF 25-30%, normal RV, IVC normal.  Cause of cardiomyopathy is uncertain, based on LGE pattern, there was concern for possible cardiac sarcoidosis versus prior myocarditis vs possible familial CMP (mother had "heart problems," he knows nothing about his father's family), however [employer] gene testing for common familial cardiomyopathies was negative. High resolution CT chest in 8/24 showed no sign of pulmonary sarcoidosis. Cardiac PET 12/24: EF 31%, not suggestive of cardiac sarcoidosis - NYHA I. Appears euvolemic.  - Continue spironolactone  25 mg daily.  - Continue Toprol  XL 25 mg daily.  - Continue Entresto  to 97/103 bid - Continue Farxiga  10 mg daily.  - Have previously discussed ICD, he is clear that he is not interested.  2. OSA: Continue nightly use  3. HTN: Stable continue current regimen   Follow up in 3 months with Dr Mitzie Anda.     Robert Renstrom NP-C  11/18/2023

## 2023-11-20 ENCOUNTER — Encounter: Payer: Self-pay | Admitting: Nurse Practitioner

## 2023-11-20 NOTE — Addendum Note (Signed)
Addended by: Rodman Pickle A on: 11/20/2023 09:51 AM   Modules accepted: Orders

## 2023-12-05 ENCOUNTER — Ambulatory Visit: Admit: 2023-12-05 | Discharge: 2023-12-06 | Payer: MEDICARE

## 2023-12-05 DIAGNOSIS — C9210 Chronic myeloid leukemia, BCR/ABL-positive, not having achieved remission: Principal | ICD-10-CM

## 2024-01-02 ENCOUNTER — Ambulatory Visit: Admit: 2024-01-02 | Discharge: 2024-01-03 | Payer: MEDICARE

## 2024-01-02 DIAGNOSIS — C9210 Chronic myeloid leukemia, BCR/ABL-positive, not having achieved remission: Principal | ICD-10-CM

## 2024-01-09 DIAGNOSIS — C9210 Chronic myeloid leukemia, BCR/ABL-positive, not having achieved remission: Principal | ICD-10-CM

## 2024-01-15 ENCOUNTER — Ambulatory Visit: Admit: 2024-01-15 | Discharge: 2024-01-16

## 2024-01-15 ENCOUNTER — Ambulatory Visit: Admit: 2024-01-15 | Discharge: 2024-01-16 | Attending: Primary Care | Primary: Primary Care

## 2024-01-15 ENCOUNTER — Encounter: Admit: 2024-01-15 | Discharge: 2024-01-16 | Attending: Primary Care | Primary: Primary Care

## 2024-01-15 DIAGNOSIS — C9210 Chronic myeloid leukemia, BCR/ABL-positive, not having achieved remission: Principal | ICD-10-CM

## 2024-01-17 ENCOUNTER — Encounter: Admit: 2024-01-17 | Discharge: 2024-01-17 | Payer: MEDICARE | Attending: Primary Care | Primary: Primary Care

## 2024-01-30 ENCOUNTER — Ambulatory Visit: Admit: 2024-01-30 | Discharge: 2024-01-31 | Payer: MEDICARE

## 2024-01-30 DIAGNOSIS — C9210 Chronic myeloid leukemia, BCR/ABL-positive, not having achieved remission: Principal | ICD-10-CM

## 2024-02-27 ENCOUNTER — Ambulatory Visit: Admit: 2024-02-27 | Discharge: 2024-02-28 | Payer: MEDICARE

## 2024-02-27 DIAGNOSIS — C9210 Chronic myeloid leukemia, BCR/ABL-positive, not having achieved remission: Principal | ICD-10-CM

## 2024-03-09 ENCOUNTER — Ambulatory Visit: Payer: Managed Care, Other (non HMO) | Admitting: Nurse Practitioner

## 2024-03-26 ENCOUNTER — Ambulatory Visit: Admit: 2024-03-26 | Discharge: 2024-03-27 | Payer: MEDICARE

## 2024-03-26 DIAGNOSIS — C9210 Chronic myeloid leukemia, BCR/ABL-positive, not having achieved remission: Principal | ICD-10-CM

## 2024-04-23 ENCOUNTER — Ambulatory Visit: Admit: 2024-04-23 | Discharge: 2024-04-24 | Payer: MEDICARE

## 2024-04-23 DIAGNOSIS — C9210 Chronic myeloid leukemia, BCR/ABL-positive, not having achieved remission: Principal | ICD-10-CM

## 2024-04-30 ENCOUNTER — Telehealth: Payer: Self-pay

## 2024-04-30 DIAGNOSIS — R972 Elevated prostate specific antigen [PSA]: Secondary | ICD-10-CM

## 2024-04-30 DIAGNOSIS — I10 Essential (primary) hypertension: Secondary | ICD-10-CM

## 2024-04-30 DIAGNOSIS — I5042 Chronic combined systolic (congestive) and diastolic (congestive) heart failure: Secondary | ICD-10-CM

## 2024-04-30 DIAGNOSIS — R7303 Prediabetes: Secondary | ICD-10-CM

## 2024-04-30 NOTE — Telephone Encounter (Signed)
 Copied from CRM [id number]. Topic: Clinical - Request for Lab/Test Order >> Apr 30, 2024 11:59 AM Revonda D wrote: Reason for CRM: Pt is requesting lab orders for blood work. Pt stated that he wants to have minerals, vitamins, etc tested. Pt would like to have an appt scheduled on 8/19 if orders are approved.

## 2024-04-30 NOTE — Telephone Encounter (Signed)
 I called and spoke with patient and an appointment made on 05/26/24 and patient will have labs drawn at this visit.

## 2024-05-21 ENCOUNTER — Ambulatory Visit: Admit: 2024-05-21 | Discharge: 2024-05-22 | Payer: MEDICARE

## 2024-05-25 ENCOUNTER — Encounter: Admit: 2024-05-25 | Discharge: 2024-05-25 | Payer: MEDICARE | Attending: Primary Care | Primary: Primary Care

## 2024-05-25 ENCOUNTER — Ambulatory Visit: Admit: 2024-05-25 | Discharge: 2024-05-25 | Payer: MEDICARE

## 2024-05-25 ENCOUNTER — Ambulatory Visit: Admit: 2024-05-25 | Discharge: 2024-05-25 | Payer: MEDICARE | Attending: Primary Care | Primary: Primary Care

## 2024-05-25 ENCOUNTER — Other Ambulatory Visit (HOSPITAL_COMMUNITY): Payer: Self-pay | Admitting: Cardiology

## 2024-05-25 DIAGNOSIS — R109 Unspecified abdominal pain: Principal | ICD-10-CM

## 2024-05-25 DIAGNOSIS — C9210 Chronic myeloid leukemia, BCR/ABL-positive, not having achieved remission: Principal | ICD-10-CM

## 2024-05-25 DIAGNOSIS — R350 Frequency of micturition: Principal | ICD-10-CM

## 2024-05-25 DIAGNOSIS — R300 Dysuria: Principal | ICD-10-CM

## 2024-05-26 ENCOUNTER — Ambulatory Visit: Admitting: Nurse Practitioner

## 2024-05-26 ENCOUNTER — Encounter: Payer: Self-pay | Admitting: Nurse Practitioner

## 2024-05-26 VITALS — BP 124/88 | HR 61 | Temp 96.5°F | Ht 70.0 in | Wt 278.4 lb

## 2024-05-26 DIAGNOSIS — H5789 Other specified disorders of eye and adnexa: Secondary | ICD-10-CM

## 2024-05-26 DIAGNOSIS — R7303 Prediabetes: Secondary | ICD-10-CM | POA: Insufficient documentation

## 2024-05-26 DIAGNOSIS — G8929 Other chronic pain: Secondary | ICD-10-CM | POA: Insufficient documentation

## 2024-05-26 DIAGNOSIS — Z419 Encounter for procedure for purposes other than remedying health state, unspecified: Secondary | ICD-10-CM

## 2024-05-26 DIAGNOSIS — I5042 Chronic combined systolic (congestive) and diastolic (congestive) heart failure: Secondary | ICD-10-CM

## 2024-05-26 DIAGNOSIS — I10 Essential (primary) hypertension: Secondary | ICD-10-CM

## 2024-05-26 DIAGNOSIS — R972 Elevated prostate specific antigen [PSA]: Secondary | ICD-10-CM | POA: Diagnosis not present

## 2024-05-26 DIAGNOSIS — Z862 Personal history of diseases of the blood and blood-forming organs and certain disorders involving the immune mechanism: Secondary | ICD-10-CM

## 2024-05-26 LAB — COMPREHENSIVE METABOLIC PANEL WITH GFR
ALT: 13 U/L (ref 0–53)
AST: 16 U/L (ref 0–37)
Albumin: 4 g/dL (ref 3.5–5.2)
Alkaline Phosphatase: 64 U/L (ref 39–117)
BUN: 11 mg/dL (ref 6–23)
CO2: 25 meq/L (ref 19–32)
Calcium: 9 mg/dL (ref 8.4–10.5)
Chloride: 106 meq/L (ref 96–112)
Creatinine, Ser: 1.06 mg/dL (ref 0.40–1.50)
GFR: 75.28 mL/min (ref >60.00)
Glucose, Bld: 93 mg/dL (ref 70–99)
Potassium: 3.8 meq/L (ref 3.5–5.1)
Sodium: 139 meq/L (ref 135–145)
Total Bilirubin: 0.6 mg/dL (ref 0.2–1.2)
Total Protein: 6.9 g/dL (ref 6.0–8.3)

## 2024-05-26 LAB — LIPID PANEL
Cholesterol: 155 mg/dL (ref 0–200)
HDL: 41 mg/dL (ref >39.00)
LDL Cholesterol: 96 mg/dL (ref 0–99)
NonHDL: 113.84
Total CHOL/HDL Ratio: 4
Triglycerides: 87 mg/dL (ref 0.0–149.0)
VLDL: 17.4 mg/dL (ref 0.0–40.0)

## 2024-05-26 LAB — CBC WITH DIFFERENTIAL/PLATELET
Basophils Absolute: 0 K/uL (ref 0.0–0.1)
Basophils Relative: 0.4 % (ref 0.0–3.0)
Eosinophils Absolute: 0.1 K/uL (ref 0.0–0.7)
Eosinophils Relative: 1.7 % (ref 0.0–5.0)
HCT: 43.4 % (ref 39.0–52.0)
Hemoglobin: 14.3 g/dL (ref 13.0–17.0)
Lymphocytes Relative: 61.1 % — ABNORMAL HIGH (ref 12.0–46.0)
Lymphs Abs: 2.8 K/uL (ref 0.7–4.0)
MCHC: 32.9 g/dL (ref 30.0–36.0)
MCV: 92.6 fl (ref 78.0–100.0)
Monocytes Absolute: 0.3 K/uL (ref 0.1–1.0)
Monocytes Relative: 6 % (ref 3.0–12.0)
Neutro Abs: 1.4 K/uL (ref 1.4–7.7)
Neutrophils Relative %: 30.8 % — ABNORMAL LOW (ref 43.0–77.0)
Platelets: 214 K/uL (ref 150.0–400.0)
RBC: 4.68 Mil/uL (ref 4.22–5.81)
RDW: 13.2 % (ref 11.5–15.5)
WBC: 4.6 K/uL (ref 4.0–10.5)

## 2024-05-26 LAB — VITAMIN B12: Vitamin B-12: 160 pg/mL — ABNORMAL LOW (ref 211–911)

## 2024-05-26 LAB — PSA: PSA: 5.29 ng/mL — ABNORMAL HIGH (ref 0.10–4.00)

## 2024-05-26 LAB — FERRITIN: Ferritin: 119.2 ng/mL (ref 22.0–322.0)

## 2024-05-26 LAB — IRON: Iron: 85 ug/dL (ref 42–165)

## 2024-05-26 LAB — HEMOGLOBIN A1C: Hgb A1c MFr Bld: 6 % (ref 4.6–6.5)

## 2024-05-26 LAB — VITAMIN D 25 HYDROXY (VIT D DEFICIENCY, FRACTURES): VITD: 48.86 ng/mL (ref 30.00–100.00)

## 2024-05-26 LAB — TSH: TSH: 1.27 u[IU]/mL (ref 0.35–5.50)

## 2024-05-26 NOTE — Progress Notes (Unsigned)
 Established Patient Office Visit  Subjective   Patient ID: [name], male    DOB: 01-28-1961  Age: 63 y.o. MRN: [id number]  Chief Complaint  Patient presents with   Chronic combined systolic and diastolic heart failure (HCC)    Follow up and request fasting labs, wants testing for copper, zinc  and potassium    HPI Discussed the use of AI scribe software for clinical note transcription with the patient, who gave verbal consent to proceed.  History of Present Illness   [name] is a 63 year old male who presents with a red and irritated eye.  He experiences a sensation of something scratching his eye, particularly from the upper lid, which has persisted for a couple of weeks. The sensation is less severe now but still present at times. He uses baby wipes frequently and recalls rubbing his eye to remove crusty sleep, which may have exacerbated the irritation. There is no itching, but he experiences a gritty feeling and cloudiness in his vision. He has an appointment with an eye doctor this afternoon.   He takes Entresto  once daily, spironolactone  every other day, metoprolol  every other day, and Farxiga  every other day. He avoids atorvastatin  due to concerns about side effects. He also takes vitamin D  and drinks beet juice daily.  He experiences shortness of breath when attempting to run, but cannot run due to significant knee pain. Swelling in his legs occurs when sitting for prolonged periods, resolving upon standing. Numbness in his left hand occurs when holding objects tightly or wearing a restrictive bracelet. Knee pain significantly limits his ability to walk. He avoids pain medications like Tylenol  or ibuprofen to not increase his medication load.       ROS See pertinent positives and negatives per HPI.    Objective:     BP 124/88 (BP Location: Left Arm, Patient Position: Sitting, Cuff Size: Large)   Pulse 61   Temp (!) 96.5 F (35.8 C)   Ht 5' 10 (1.778  m)   Wt 278 lb 6.4 oz (126.3 kg)   SpO2 95%   BMI 39.95 kg/m  BP Readings from Last 3 Encounters:  05/26/24 124/88  11/18/23 124/80  09/02/23 120/82   Wt Readings from Last 3 Encounters:  05/26/24 278 lb 6.4 oz (126.3 kg)  11/18/23 279 lb 6 oz (126.7 kg)  09/02/23 272 lb (123.4 kg)      Physical Exam Vitals and nursing note reviewed.  Constitutional:      Appearance: Normal appearance.  HENT:     Head: Normocephalic.  Eyes:     Conjunctiva/sclera:     Right eye: Right conjunctiva is injected.     Left eye: Left conjunctiva is not injected.  Cardiovascular:     Rate and Rhythm: Normal rate and regular rhythm.     Pulses: Normal pulses.     Heart sounds: Normal heart sounds.  Pulmonary:     Effort: Pulmonary effort is normal.     Breath sounds: Normal breath sounds.  Musculoskeletal:     Cervical back: Normal range of motion.     Right lower leg: No edema.     Left lower leg: No edema.  Skin:    General: Skin is warm.  Neurological:     General: No focal deficit present.     Mental Status: He is alert and oriented to person, place, and time.  Psychiatric:        Mood and Affect: Mood normal.  Behavior: Behavior normal.        Thought Content: Thought content normal.        Judgment: Judgment normal.       Assessment & Plan:   Problem List Items Addressed This Visit       Cardiovascular and Mediastinum   Chronic combined systolic and diastolic heart failure (HCC) - Primary (Chronic)   Chronic, euvolemic on exam. Managed with Entresto  24-26mg  daily, spironolactone , metoprolol  25mg  every other day, and Farxiga  10mg  every other day. He experiences exertional dyspnea and leg swelling. Encouraged him to take medications daily as precscribed and entresto  twice a day. Schedule cardiology follow-up. Check CMP, CBC, lipid panel today.       Hypertension (Chronic)   Chronic, stable. Continue entresto  24-26mg  BID, toprol  XL 25mg  daily and spironolactone  25mg   every other day. Continue collaboration with cardiology. Follow-up in 6 months.         Other   Morbid obesity (HCC)   BMI 39.9. Discussed nutrition, exercise. Check A1c today.       Prediabetes   Chronic, stable. Check A1c today and treat based on results.       Other Visit Diagnoses       Red eye       Possible viral infection or corneal scratch. Attend optometrist appointment for further evaluation.     Elevated PSA       Recheck PSA today     History of anemia       History of low hemoglobin. Check CBC, iron, ferritin, B12 today and treat based on results .   Relevant Orders   Iron (Completed)   Ferritin (Completed)     Patient-requested procedure       He requests zinc  and copper to be checked today. Dicussed this is not medically necessary and insurance may not pay. He understands.   Relevant Orders   Zinc    Ceruloplasmin      Return in about 6 months (around 11/26/2024) for CPE.    Tinnie DELENA Harada, NP

## 2024-05-26 NOTE — Patient Instructions (Signed)
 It was great to see you!  We are checking your labs today and will let you know the results via mychart/phone.   Call the cardiologist to schedule an appointment [phone]  Let's follow-up in 6 months, sooner if you have concerns.  If a referral was placed today, you will be contacted for an appointment. Please note that routine referrals can sometimes take up to 3-4 weeks to process. Please call our office if you haven't heard anything after this time frame.  Take care,  Tinnie Harada, NP

## 2024-05-27 ENCOUNTER — Ambulatory Visit: Payer: Self-pay | Admitting: Nurse Practitioner

## 2024-05-27 DIAGNOSIS — R972 Elevated prostate specific antigen [PSA]: Secondary | ICD-10-CM

## 2024-05-27 DIAGNOSIS — Z419 Encounter for procedure for purposes other than remedying health state, unspecified: Secondary | ICD-10-CM

## 2024-05-27 NOTE — Assessment & Plan Note (Signed)
 BMI 39.9. Discussed nutrition, exercise. Check A1c today.

## 2024-05-27 NOTE — Assessment & Plan Note (Signed)
Chronic, stable. Check A1c today and treat based on results.

## 2024-05-27 NOTE — Assessment & Plan Note (Signed)
 Chronic, stable. Continue entresto  24-26mg  BID, toprol  XL 25mg  daily and spironolactone  25mg  every other day. Continue collaboration with cardiology. Follow-up in 6 months.

## 2024-05-27 NOTE — Assessment & Plan Note (Signed)
 Chronic, euvolemic on exam. Managed with Entresto  24-26mg  daily, spironolactone , metoprolol  25mg  every other day, and Farxiga  10mg  every other day. He experiences exertional dyspnea and leg swelling. Encouraged him to take medications daily as precscribed and entresto  twice a day. Schedule cardiology follow-up. Check CMP, CBC, lipid panel today.

## 2024-05-29 LAB — ZINC

## 2024-05-29 LAB — CERULOPLASMIN: Ceruloplasmin: 22 mg/dL (ref 14–30)

## 2024-05-29 LAB — EXTRA SPECIMEN

## 2024-06-03 ENCOUNTER — Other Ambulatory Visit

## 2024-06-03 DIAGNOSIS — Z419 Encounter for procedure for purposes other than remedying health state, unspecified: Secondary | ICD-10-CM

## 2024-06-03 LAB — ZINC: Zinc: 86 ug/dL (ref 60–130)

## 2024-06-04 LAB — ZINC

## 2024-06-18 ENCOUNTER — Ambulatory Visit: Admit: 2024-06-18 | Discharge: 2024-06-19 | Payer: MEDICARE

## 2024-06-18 DIAGNOSIS — C9210 Chronic myeloid leukemia, BCR/ABL-positive, not having achieved remission: Principal | ICD-10-CM

## 2024-06-20 ENCOUNTER — Other Ambulatory Visit (HOSPITAL_COMMUNITY): Payer: Self-pay | Admitting: Cardiology

## 2024-07-16 ENCOUNTER — Ambulatory Visit: Admit: 2024-07-16 | Discharge: 2024-07-17 | Payer: MEDICARE

## 2024-07-16 DIAGNOSIS — C9210 Chronic myeloid leukemia, BCR/ABL-positive, not having achieved remission: Principal | ICD-10-CM

## 2024-07-27 ENCOUNTER — Ambulatory Visit: Admit: 2024-07-27 | Discharge: 2024-07-27 | Payer: MEDICARE

## 2024-07-27 ENCOUNTER — Ambulatory Visit: Admit: 2024-07-27 | Discharge: 2024-07-27 | Payer: MEDICARE | Attending: Primary Care | Primary: Primary Care

## 2024-07-27 ENCOUNTER — Encounter: Admit: 2024-07-27 | Discharge: 2024-07-27 | Payer: MEDICARE | Attending: Primary Care | Primary: Primary Care

## 2024-07-27 DIAGNOSIS — C9210 Chronic myeloid leukemia, BCR/ABL-positive, not having achieved remission: Principal | ICD-10-CM

## 2024-07-30 ENCOUNTER — Encounter: Admit: 2024-07-30 | Discharge: 2024-07-30 | Payer: MEDICARE | Attending: Primary Care | Primary: Primary Care

## 2024-08-10 ENCOUNTER — Ambulatory Visit (HOSPITAL_COMMUNITY)
Admission: RE | Admit: 2024-08-10 | Discharge: 2024-08-10 | Disposition: A | Discharge status: Home / Self Care | Source: Ambulatory Visit | Attending: Cardiology | Admitting: Cardiology

## 2024-08-10 ENCOUNTER — Encounter (HOSPITAL_COMMUNITY): Payer: Self-pay | Admitting: Cardiology

## 2024-08-10 ENCOUNTER — Ambulatory Visit (HOSPITAL_COMMUNITY): Payer: Self-pay | Admitting: Cardiology

## 2024-08-10 VITALS — BP 104/70 | HR 81 | Wt 269.8 lb

## 2024-08-10 DIAGNOSIS — G4733 Obstructive sleep apnea (adult) (pediatric): Secondary | ICD-10-CM | POA: Diagnosis not present

## 2024-08-10 DIAGNOSIS — I5042 Chronic combined systolic (congestive) and diastolic (congestive) heart failure: Secondary | ICD-10-CM

## 2024-08-10 DIAGNOSIS — Z79899 Other long term (current) drug therapy: Secondary | ICD-10-CM | POA: Insufficient documentation

## 2024-08-10 DIAGNOSIS — I493 Ventricular premature depolarization: Secondary | ICD-10-CM | POA: Insufficient documentation

## 2024-08-10 DIAGNOSIS — I428 Other cardiomyopathies: Secondary | ICD-10-CM | POA: Insufficient documentation

## 2024-08-10 DIAGNOSIS — Z8249 Family history of ischemic heart disease and other diseases of the circulatory system: Secondary | ICD-10-CM | POA: Insufficient documentation

## 2024-08-10 DIAGNOSIS — I5022 Chronic systolic (congestive) heart failure: Secondary | ICD-10-CM | POA: Diagnosis present

## 2024-08-10 DIAGNOSIS — I110 Hypertensive heart disease with heart failure: Secondary | ICD-10-CM | POA: Diagnosis not present

## 2024-08-10 DIAGNOSIS — M109 Gout, unspecified: Secondary | ICD-10-CM | POA: Diagnosis not present

## 2024-08-10 LAB — BASIC METABOLIC PANEL WITH GFR
Anion gap: 12 (ref 5–15)
BUN: 9 mg/dL (ref 8–23)
CO2: 25 mmol/L (ref 22–32)
Calcium: 9.3 mg/dL (ref 8.9–10.3)
Chloride: 101 mmol/L (ref 98–111)
Creatinine, Ser: 1.31 mg/dL — ABNORMAL HIGH (ref 0.61–1.24)
GFR, Estimated: >60 mL/min (ref >60)
Glucose, Bld: 89 mg/dL (ref 70–99)
Potassium: 4 mmol/L (ref 3.5–5.1)
Sodium: 138 mmol/L (ref 135–145)

## 2024-08-10 LAB — BRAIN NATRIURETIC PEPTIDE: B Natriuretic Peptide: 117.6 pg/mL — ABNORMAL HIGH (ref 0.0–100.0)

## 2024-08-10 MED ORDER — SACUBITRIL-VALSARTAN 49-51 MG PO TABS: 1.0000 | ORAL_TABLET | Freq: Two times a day (BID) | ORAL | 11 refills | Status: AC

## 2024-08-10 NOTE — Patient Instructions (Signed)
 CHANGE Entresto  to 49/51 mg Twice daily  PLEASE MAKE SURE YOU TAKE YOUR SPIRONOLACTONE  EVERY DAY.  Labs done today, your results will be available in MyChart, we will contact you for abnormal readings.  REPEAT Blood work in 10 days.  Your physician recommends that you schedule a follow-up appointment in: 6 weeks.  If you have any questions or concerns before your next appointment please send us  a message through [place] or call our office at [phone].    TO LEAVE A MESSAGE FOR THE NURSE SELECT OPTION 2, PLEASE LEAVE A MESSAGE INCLUDING: YOUR NAME DATE OF BIRTH CALL BACK NUMBER REASON FOR CALL**this is important as we prioritize the call backs  YOU WILL RECEIVE A CALL BACK THE SAME DAY AS LONG AS YOU CALL BEFORE 4:00 PM  At the [facility], you and your health needs are our priority. As part of our continuing mission to provide you with exceptional heart care, we have created designated Provider Care Teams. These Care Teams include your primary Cardiologist (physician) and Advanced Practice Providers (APPs- Physician Assistants and Nurse Practitioners) who all work together to provide you with the care you need, when you need it.   You may see any of the following providers on your designated Care Team at your next follow up: Dr Toribio Fuel Dr Ezra Shuck Dr. Morene Brownie Greig Mosses, NP Caffie Shed, [place] [facility] [place], [place] Beckey Coe, NP Jordan Lee, NP Ellouise Class, NP Tinnie Redman, PharmD Jaun Bash, PharmD   Please be sure to bring in all your medications bottles to every appointment.    Thank you for choosing [facility]

## 2024-08-10 NOTE — Progress Notes (Signed)
 ADVANCED HF CLINIC NOTE   PCP: Nedra Tinnie LABOR, NP Cardiology: Dr. Shlomo HF Cardiology: Dr. Rolan  Chief complaint: Heart Failure   63 y.o. with history of OSA, HTN, gout, and HFrEF.    Patient was diagnosed with CHF in 2018, echo at that time showed EF 20-25%.  Cath showed no significant CAD in 7/18. He was started on GDMT and EF gradually increased, echo in 5/22 with EF 45-50%, and echo in 4/23 with EF 50%.  After the 4/23 echo, patient felt good so he stopped all his medications.  Echo was repeated in 4/24, showing EF back down to 25-30% with regional WMAs and moderate RV dysfunction.  [employer] gene testing for genetic cardiomyopathies was negative in 7/24. Echo in 10/24 showed EF 25-30%, and cardiac PET in 12/24 was not suggestive of cardiac sarcoidosis.   Today he returns for HF follow up. 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 He is taking spironolactone  every other day.  Weight is down 10 lbs.  He only gets short of breath if he runs.  No dyspnea with usual activities.  No chest pain.  No orthopnea/PND.  No lightheadedness.   ECG (personally reviewed): NSR, PVC, LAFB  Labs (3/25): LDL 96, K 3.8, creatinine 8.93  PMH: 1. OSA: CPAP 2. HTN 3. Hyperlipidemia 4. Gout 5. Chronic systolic CHF: Diagnosed in 2018, nonischemic cardiomyopathy.  - Echo (2018): EF 20-25%, mild LVH - LHC (7/18): Normal coronaries - Echo (5/19): EF 40-45% - Echo (4/21): EF 45-50% - Echo (5/22): EF 45-50% - PYP scan (9/22): grade 1, H/CL 1-1.5. Equivocal. - Echo (4/23): EF 50% - Echo (4/24): EF 25-30%, regional wall motion abnormalities, moderate RV dysfunction - [employer] gene testing negative for genetic cardiomyopathies in 7/24.  - LHC/RHC 5/24: mean RA 4, PA 25/9, mean PCWP 6, CI 2.24; no significant CAD - Echo (7/24): EF 25-30%, global hypokinesis, mild LV dilation, mildly decreased RV systolic function.  - Cardiac MRI (7/24): LV EF 28%, RV EF 35%, subtle mid-wall LGE in the basal septum, prominent  subepicardial LGE in the basal to mid inferolateral wall.  - Echo (10/24): EF 25-30%, normal RV, IVC normal. - Cardiac PET 12/24: EF 31%, not suggestive of cardiac sarcoidosis 6. High resolution CT chest in 8/24 showed no sign of pulmonary sarcoidosis.   SH: Truck driver, nonsmoker, rare ETOH, widowed.    FH: Uncle with MI, mother with heart problems.   ROS: All systems reviewed and negative except as per HPI.   Current Outpatient Medications  Medication Sig Dispense Refill   FARXIGA  10 MG TABS tablet Take 1 tablet (10 mg total) by mouth daily before breakfast. PLEASE SCHEDULE APPOINTMENT FOR MORE REFILLS 90 tablet 0   metoprolol  succinate (TOPROL -XL) 25 MG 24 hr tablet Take 1 tablet (25 mg total) by mouth daily. PLEASE SCHEDULE APPOINTMENT FOR MORE REFILLS 90 tablet 0   sacubitril-valsartan (ENTRESTO ) 49-51 MG Take 1 tablet by mouth 2 (two) times daily. 60 tablet 11   spironolactone  (ALDACTONE ) 25 MG tablet Take 1 tablet (25 mg total) by mouth daily. 90 tablet 3   atorvastatin  (LIPITOR ) 40 MG tablet Take 1 tablet (40 mg total) by mouth daily. (Patient not taking: Reported on 08/10/2024) 90 tablet 3   No current facility-administered medications for this encounter.   Wt Readings from Last 3 Encounters:  08/10/24 122.4 kg (269 lb 12.8 oz)  05/26/24 126.3 kg (278 lb 6.4 oz)  11/18/23 126.7 kg (279 lb 6 oz)   BP 104/70  Pulse 81   Wt 122.4 kg (269 lb 12.8 oz)   SpO2 96%   BMI 38.71 kg/m  General: NAD Neck: No JVD, no thyromegaly or thyroid  nodule.  Lungs: Clear to auscultation bilaterally with normal respiratory effort. CV: Nondisplaced PMI.  Heart regular S1/S2, no S3/S4, no murmur.  No peripheral edema.  No carotid bruit.  Normal pedal pulses.  Abdomen: Soft, nontender, no hepatosplenomegaly, no distention.  Skin: Intact without lesions or rashes.  Neurologic: Alert and oriented x 3.  Psych: Normal affect. Extremities: No clubbing or cyanosis.  HEENT: Normal.     Assessment/Plan: 1. Chronic systolic CHF: Cardiomyopathy was diagnosed in 2018 with echo showing EF 20-25%, cath at that time showed no CAD so nonischemic cardiomyopathy.  Over the years, EF improved until it was up to 50% on echo in 4/23.  Patient stopped his medications, and echo in 4/24 showed EF 25-30%, regional wall motion abnormalities, moderate RV dysfunction.  Patient may have had a cardiomyopathy that improved with GDMT then worsened when he stopped all his meds. Cath in 5/24 showed normal filling pressures, no significant CAD. Cardiac MRI in 7/24 showed LV EF 28%, RV EF 35%, subtle mid-wall LGE in the basal septum, prominent subepicardial LGE in the basal to mid inferolateral wall.  Echo 7/24 showed EF 25-30% with mild RV dysfunction. Echo 10/24 showed EF 25-30%, normal RV, IVC normal.  Cause of cardiomyopathy is uncertain, based on LGE pattern, there was concern for possible cardiac sarcoidosis versus prior myocarditis vs possible familial CMP (mother had heart problems, he knows nothing about his father's family). [employer]ae gene testing for common familial cardiomyopathies was negative in 7/24. High resolution CT chest in 8/24 showed no sign of pulmonary sarcoidosis, and cardiac PET in 12/24 was not suggestive of cardiac sarcoidosis. NYHA class I-II, not volume overloaded on exam.  - Increase spironolactone  from 25 every other day to 25 daily.  BMET/BNP today, BMET in 10 days.  - Continue Toprol  XL 25 mg daily.  - Increase Entresto  to 49/51 bid.  - Continue Farxiga  10 mg daily.  - Have previously discussed ICD, he has made it clear that he is not interested.  2. OSA: Continue nightly use  3. HTN: BP not elevated.   Follow up in 6 wks with APP.   I spent 31 minutes reviewing records, interviewing/examining patient, and managing orders.    [name]  08/10/2024

## 2024-08-13 ENCOUNTER — Ambulatory Visit: Admit: 2024-08-13 | Payer: MEDICARE

## 2024-08-14 ENCOUNTER — Ambulatory Visit: Admit: 2024-08-14 | Discharge: 2024-08-15 | Payer: MEDICARE

## 2024-08-14 DIAGNOSIS — C9210 Chronic myeloid leukemia, BCR/ABL-positive, not having achieved remission: Principal | ICD-10-CM

## 2024-08-20 ENCOUNTER — Ambulatory Visit (HOSPITAL_COMMUNITY)
Admission: RE | Admit: 2024-08-20 | Discharge: 2024-08-20 | Disposition: A | Discharge status: Home / Self Care | Source: Ambulatory Visit | Attending: Internal Medicine | Admitting: Internal Medicine

## 2024-08-20 DIAGNOSIS — I5042 Chronic combined systolic (congestive) and diastolic (congestive) heart failure: Secondary | ICD-10-CM | POA: Insufficient documentation

## 2024-08-20 LAB — BASIC METABOLIC PANEL WITH GFR
Anion gap: 9 (ref 5–15)
BUN: 14 mg/dL (ref 8–23)
CO2: 25 mmol/L (ref 22–32)
Calcium: 9.4 mg/dL (ref 8.9–10.3)
Chloride: 106 mmol/L (ref 98–111)
Creatinine, Ser: 1.17 mg/dL (ref 0.61–1.24)
GFR, Estimated: >60 mL/min (ref >60)
Glucose, Bld: 82 mg/dL (ref 70–99)
Potassium: 4 mmol/L (ref 3.5–5.1)
Sodium: 140 mmol/L (ref 135–145)

## 2024-09-10 ENCOUNTER — Ambulatory Visit: Admit: 2024-09-10 | Discharge: 2024-09-11 | Payer: MEDICARE

## 2024-09-10 DIAGNOSIS — C9210 Chronic myeloid leukemia, BCR/ABL-positive, not having achieved remission: Principal | ICD-10-CM

## 2024-09-16 NOTE — Progress Notes (Signed)
 ADVANCED [facility] CLINIC NOTE   PCP: Nedra Tinnie LABOR, NP Cardiology: Dr. Shlomo HF Cardiology: Dr. Rolan  Chief complaint: Heart Failure   63 y.o. with history of OSA, HTN, gout, and HFrEF.    Patient was diagnosed with CHF in 2018, echo at that time showed EF 20-25%.  Cath showed no significant CAD in 7/18. He was started on GDMT and EF gradually increased, echo in 5/22 with EF 45-50%, and echo in 4/23 with EF 50%.  After the 4/23 echo, patient felt good so he stopped all his medications.  Echo was repeated in 4/24, showing EF back down to 25-30% with regional WMAs and moderate RV dysfunction.  [employer] gene testing for genetic cardiomyopathies was negative in 7/24. Echo in 10/24 showed EF 25-30%, and cardiac PET in 12/24 was not suggestive of cardiac sarcoidosis.   Today he returns for HF follow up. 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 He is taking spironolactone  every other day.  Weight is down 10 lbs.  He only gets short of breath if he runs.  No dyspnea with usual activities.  No chest pain.  No orthopnea/PND.  No lightheadedness.   ECG (personally reviewed): NSR, PVC, LAFB  Labs (3/25): LDL 96, K 3.8, creatinine 8.93  PMH: 1. OSA: CPAP 2. HTN 3. Hyperlipidemia 4. Gout 5. Chronic systolic CHF: Diagnosed in 2018, nonischemic cardiomyopathy.  - Echo (2018): EF 20-25%, mild LVH - LHC (7/18): Normal coronaries - Echo (5/19): EF 40-45% - Echo (4/21): EF 45-50% - Echo (5/22): EF 45-50% - PYP scan (9/22): grade 1, H/CL 1-1.5. Equivocal. - Echo (4/23): EF 50% - Echo (4/24): EF 25-30%, regional wall motion abnormalities, moderate RV dysfunction - [employer] gene testing negative for genetic cardiomyopathies in 7/24.  - LHC/RHC 5/24: mean RA 4, PA 25/9, mean PCWP 6, CI 2.24; no significant CAD - Echo (7/24): EF 25-30%, global hypokinesis, mild LV dilation, mildly decreased RV systolic function.  - Cardiac MRI (7/24): LV EF 28%, RV EF 35%, subtle mid-wall LGE in the basal septum, prominent  subepicardial LGE in the basal to mid inferolateral wall.  - Echo (10/24): EF 25-30%, normal RV, IVC normal. - Cardiac PET 12/24: EF 31%, not suggestive of cardiac sarcoidosis 6. High resolution CT chest in 8/24 showed no sign of pulmonary sarcoidosis.   SH: Truck driver, nonsmoker, rare ETOH, widowed.    FH: Uncle with MI, mother with heart problems.   ROS: All systems reviewed and negative except as per HPI.   Current Outpatient Medications  Medication Sig Dispense Refill   atorvastatin  (LIPITOR ) 40 MG tablet Take 1 tablet (40 mg total) by mouth daily. (Patient not taking: Reported on 08/10/2024) 90 tablet 3   FARXIGA  10 MG TABS tablet Take 1 tablet (10 mg total) by mouth daily before breakfast. PLEASE SCHEDULE APPOINTMENT FOR MORE REFILLS 90 tablet 0   metoprolol  succinate (TOPROL -XL) 25 MG 24 hr tablet Take 1 tablet (25 mg total) by mouth daily. PLEASE SCHEDULE APPOINTMENT FOR MORE REFILLS 90 tablet 0   sacubitril -valsartan  (ENTRESTO ) 49-51 MG Take 1 tablet by mouth 2 (two) times daily. 60 tablet 11   spironolactone  (ALDACTONE ) 25 MG tablet Take 1 tablet (25 mg total) by mouth daily. 90 tablet 3   No current facility-administered medications for this visit.   Wt Readings from Last 3 Encounters:  08/10/24 122.4 kg (269 lb 12.8 oz)  05/26/24 126.3 kg (278 lb 6.4 oz)  11/18/23 126.7 kg (279 lb 6 oz)   There were no  vitals taken for this visit. General: NAD Neck: No JVD, no thyromegaly or thyroid  nodule.  Lungs: Clear to auscultation bilaterally with normal respiratory effort. CV: Nondisplaced PMI.  Heart regular S1/S2, no S3/S4, no murmur.  No peripheral edema.  No carotid bruit.  Normal pedal pulses.  Abdomen: Soft, nontender, no hepatosplenomegaly, no distention.  Skin: Intact without lesions or rashes.  Neurologic: Alert and oriented x 3.  Psych: Normal affect. Extremities: No clubbing or cyanosis.  HEENT: Normal.    Assessment/Plan: 1. Chronic systolic CHF: Cardiomyopathy  was diagnosed in 2018 with echo showing EF 20-25%, cath at that time showed no CAD so nonischemic cardiomyopathy.  Over the years, EF improved until it was up to 50% on echo in 4/23.  Patient stopped his medications, and echo in 4/24 showed EF 25-30%, regional wall motion abnormalities, moderate RV dysfunction.  Patient may have had a cardiomyopathy that improved with GDMT then worsened when he stopped all his meds. Cath in 5/24 showed normal filling pressures, no significant CAD. Cardiac MRI in 7/24 showed LV EF 28%, RV EF 35%, subtle mid-wall LGE in the basal septum, prominent subepicardial LGE in the basal to mid inferolateral wall.  Echo 7/24 showed EF 25-30% with mild RV dysfunction. Echo 10/24 showed EF 25-30%, normal RV, IVC normal.  Cause of cardiomyopathy is uncertain, based on LGE pattern, there was concern for possible cardiac sarcoidosis versus prior myocarditis vs possible familial CMP (mother had heart problems, he knows nothing about his father's family). [employer] gene testing for common familial cardiomyopathies was negative in 7/24. High resolution CT chest in 8/24 showed no sign of pulmonary sarcoidosis, and cardiac PET in 12/24 was not suggestive of cardiac sarcoidosis. NYHA class I-II, not volume overloaded on exam.  - Increase spironolactone  from 25 every other day to 25 daily.  BMET/BNP today, BMET in 10 days.  - Continue Toprol  XL 25 mg daily.  - Increase Entresto  to 49/51 bid.  - Continue Farxiga  10 mg daily.  - Have previously discussed ICD, he has made it clear that he is not interested.  2. OSA: Continue nightly use  3. HTN: BP not elevated.   Follow up in 6 wks with APP.   I spent 31 minutes reviewing records, interviewing/examining patient, and managing orders.    Harlene HERO [facility]  09/16/2024

## 2024-09-18 ENCOUNTER — Telehealth (HOSPITAL_COMMUNITY): Payer: Self-pay

## 2024-09-18 NOTE — Telephone Encounter (Addendum)
 Called to confirm/remind patient of their appointment at the [facility] on 09/21/24.   Appointment:   [] Confirmed  [] Left mess   [] No answer/No voice mail  [x] VM Full/unable to leave message  [] Phone not in service

## 2024-09-21 ENCOUNTER — Ambulatory Visit (HOSPITAL_COMMUNITY)
Admission: RE | Admit: 2024-09-21 | Discharge: 2024-09-21 | Disposition: A | Source: Ambulatory Visit | Attending: Family Medicine

## 2024-09-21 ENCOUNTER — Ambulatory Visit (HOSPITAL_COMMUNITY): Payer: Self-pay | Admitting: Family Medicine

## 2024-09-21 ENCOUNTER — Encounter (HOSPITAL_COMMUNITY): Payer: Self-pay

## 2024-09-21 VITALS — BP 112/70 | HR 66 | Wt 259.0 lb

## 2024-09-21 DIAGNOSIS — I10 Essential (primary) hypertension: Secondary | ICD-10-CM

## 2024-09-21 DIAGNOSIS — G4733 Obstructive sleep apnea (adult) (pediatric): Secondary | ICD-10-CM

## 2024-09-21 DIAGNOSIS — Z7984 Long term (current) use of oral hypoglycemic drugs: Secondary | ICD-10-CM | POA: Diagnosis not present

## 2024-09-21 DIAGNOSIS — Z79899 Other long term (current) drug therapy: Secondary | ICD-10-CM | POA: Diagnosis not present

## 2024-09-21 DIAGNOSIS — I5022 Chronic systolic (congestive) heart failure: Secondary | ICD-10-CM | POA: Diagnosis present

## 2024-09-21 DIAGNOSIS — M109 Gout, unspecified: Secondary | ICD-10-CM | POA: Diagnosis not present

## 2024-09-21 DIAGNOSIS — I428 Other cardiomyopathies: Secondary | ICD-10-CM | POA: Diagnosis not present

## 2024-09-21 DIAGNOSIS — I110 Hypertensive heart disease with heart failure: Secondary | ICD-10-CM | POA: Diagnosis present

## 2024-09-21 LAB — BASIC METABOLIC PANEL WITH GFR
Anion gap: 8 (ref 5–15)
BUN: 8 mg/dL (ref 8–23)
CO2: 28 mmol/L (ref 22–32)
Calcium: 9.7 mg/dL (ref 8.9–10.3)
Chloride: 105 mmol/L (ref 98–111)
Creatinine, Ser: 1.11 mg/dL (ref 0.61–1.24)
GFR, Estimated: >60 mL/min (ref >60)
Glucose, Bld: 87 mg/dL (ref 70–99)
Potassium: 4.1 mmol/L (ref 3.5–5.1)
Sodium: 141 mmol/L (ref 135–145)

## 2024-09-21 LAB — BRAIN NATRIURETIC PEPTIDE: B Natriuretic Peptide: 126.7 pg/mL — ABNORMAL HIGH (ref 0.0–100.0)

## 2024-09-21 MED ORDER — FARXIGA 10 MG PO TABS: 10.0000 mg | ORAL_TABLET | Freq: Every day | ORAL | 3 refills | Status: AC

## 2024-09-21 MED ORDER — METOPROLOL SUCCINATE ER 25 MG PO TB24: 25.0000 mg | ORAL_TABLET | Freq: Every day | ORAL | 3 refills | Status: AC

## 2024-09-21 NOTE — Patient Instructions (Addendum)
 Thank you for coming in today  If you had labs drawn today, any labs that are abnormal the clinic will call you No news is good news  Medications: No changes   Follow up appointments:  Your physician recommends that you schedule a follow-up appointment in:  6 months With Dr. Rolan Please call our office to schedule the follow-up appointment in March 2026 for June 2026.    Do the following things EVERYDAY: Weigh yourself in the morning before breakfast. Write it down and keep it in a log. Take your medicines as prescribed Eat low salt foods--Limit salt (sodium) to 2000 mg per day.  Stay as active as you can everyday Limit all fluids for the day to less than 2 liters   At the [facility], you and your health needs are our priority. As part of our continuing mission to provide you with exceptional heart care, we have created designated Provider Care Teams. These Care Teams include your primary Cardiologist (physician) and Advanced Practice Providers (APPs- Physician Assistants and Nurse Practitioners) who all work together to provide you with the care you need, when you need it.   You may see any of the following providers on your designated Care Team at your next follow up: Dr Toribio Fuel Dr Ezra Rolan Dr. Ria Gardenia Greig Lenetta, NP Caffie Shed, [place] [facility] [place], [place] Beckey Coe, NP Tinnie Redman, PharmD   Please be sure to bring in all your medications bottles to every appointment.    Thank you for choosing [facility]  If you have any questions or concerns before your next appointment please send us  a message through [place] or call our office at [phone].    TO LEAVE A MESSAGE FOR THE NURSE SELECT OPTION 2, PLEASE LEAVE A MESSAGE INCLUDING: YOUR NAME DATE OF BIRTH CALL BACK NUMBER REASON FOR CALL**this is important as we prioritize the call backs  YOU WILL RECEIVE A CALL  BACK THE SAME DAY AS LONG AS YOU CALL BEFORE 4:00 PM

## 2024-10-05 ENCOUNTER — Other Ambulatory Visit (HOSPITAL_COMMUNITY): Payer: Self-pay | Admitting: Family Medicine

## 2024-10-07 ENCOUNTER — Ambulatory Visit: Admit: 2024-10-07 | Discharge: 2024-10-08 | Payer: MEDICARE

## 2024-11-04 ENCOUNTER — Ambulatory Visit: Admit: 2024-11-04 | Discharge: 2024-11-05 | Payer: MEDICARE

## 2024-11-04 DIAGNOSIS — C9210 Chronic myeloid leukemia, BCR/ABL-positive, not having achieved remission: Principal | ICD-10-CM
